# Patient Record
Sex: Female | Born: 1935 | Race: White | Hispanic: No | Smoking: Former smoker
Health system: Southern US, Community
[De-identification: ages and names within clinical notes are randomized; demographics above are authoritative.]

## PROBLEM LIST (undated history)

## (undated) DIAGNOSIS — I5032 Chronic diastolic (congestive) heart failure: Secondary | ICD-10-CM

## (undated) DIAGNOSIS — M109 Gout, unspecified: Secondary | ICD-10-CM

## (undated) DIAGNOSIS — N183 Chronic kidney disease, stage 3 unspecified: Secondary | ICD-10-CM

## (undated) DIAGNOSIS — I38 Endocarditis, valve unspecified: Secondary | ICD-10-CM

## (undated) DIAGNOSIS — E139 Other specified diabetes mellitus without complications: Secondary | ICD-10-CM

## (undated) DIAGNOSIS — I350 Nonrheumatic aortic (valve) stenosis: Secondary | ICD-10-CM

## (undated) DIAGNOSIS — J189 Pneumonia, unspecified organism: Secondary | ICD-10-CM

## (undated) DIAGNOSIS — I10 Essential (primary) hypertension: Secondary | ICD-10-CM

## (undated) DIAGNOSIS — J449 Chronic obstructive pulmonary disease, unspecified: Secondary | ICD-10-CM

## (undated) DIAGNOSIS — I482 Chronic atrial fibrillation, unspecified: Secondary | ICD-10-CM

## (undated) DIAGNOSIS — I4891 Unspecified atrial fibrillation: Secondary | ICD-10-CM

## (undated) DIAGNOSIS — I2510 Atherosclerotic heart disease of native coronary artery without angina pectoris: Secondary | ICD-10-CM

## (undated) DIAGNOSIS — I219 Acute myocardial infarction, unspecified: Secondary | ICD-10-CM

## (undated) DIAGNOSIS — E785 Hyperlipidemia, unspecified: Secondary | ICD-10-CM

## (undated) HISTORY — DX: Nonrheumatic aortic (valve) stenosis: I35.0

## (undated) HISTORY — DX: Chronic obstructive pulmonary disease, unspecified: J44.9

## (undated) HISTORY — DX: Unspecified atrial fibrillation: I48.91

## (undated) HISTORY — DX: Chronic kidney disease, stage 3 unspecified: N18.30

## (undated) HISTORY — PX: OTHER SURGICAL HISTORY: SHX169

## (undated) HISTORY — DX: Gout, unspecified: M10.9

## (undated) HISTORY — DX: Other specified diabetes mellitus without complications: E13.9

## (undated) HISTORY — DX: Hyperlipidemia, unspecified: E78.5

## (undated) HISTORY — DX: Endocarditis, valve unspecified: I38

## (undated) HISTORY — DX: Acute myocardial infarction, unspecified: I21.9

## (undated) HISTORY — DX: Chronic atrial fibrillation, unspecified: I48.20

## (undated) HISTORY — DX: Atherosclerotic heart disease of native coronary artery without angina pectoris: I25.10

## (undated) HISTORY — DX: Essential (primary) hypertension: I10

## (undated) HISTORY — DX: Chronic diastolic (congestive) heart failure: I50.32

## (undated) HISTORY — DX: Pneumonia, unspecified organism: J18.9

---

## 2003-01-10 ENCOUNTER — Emergency Department (HOSPITAL_COMMUNITY): Admission: EM | Admit: 2003-01-10 | Discharge: 2003-01-10 | Payer: Self-pay | Admitting: Emergency Medicine

## 2005-06-03 ENCOUNTER — Ambulatory Visit: Payer: Self-pay | Admitting: Family Medicine

## 2005-09-24 ENCOUNTER — Ambulatory Visit: Payer: Self-pay | Admitting: Family Medicine

## 2006-01-27 ENCOUNTER — Ambulatory Visit: Payer: Self-pay | Admitting: Family Medicine

## 2010-11-07 ENCOUNTER — Other Ambulatory Visit (HOSPITAL_COMMUNITY): Payer: Self-pay | Admitting: Nephrology

## 2010-11-07 DIAGNOSIS — N289 Disorder of kidney and ureter, unspecified: Secondary | ICD-10-CM

## 2010-11-15 ENCOUNTER — Ambulatory Visit (HOSPITAL_COMMUNITY)
Admission: RE | Admit: 2010-11-15 | Discharge: 2010-11-15 | Disposition: A | Discharge status: Home / Self Care | Payer: PRIVATE HEALTH INSURANCE | Source: Ambulatory Visit | Attending: Nephrology | Admitting: Nephrology

## 2010-11-15 DIAGNOSIS — N289 Disorder of kidney and ureter, unspecified: Secondary | ICD-10-CM | POA: Insufficient documentation

## 2013-01-04 ENCOUNTER — Telehealth: Payer: Self-pay | Admitting: Physician Assistant

## 2013-01-04 NOTE — Telephone Encounter (Signed)
APPT MADE

## 2013-01-05 ENCOUNTER — Encounter: Payer: Self-pay | Admitting: Physician Assistant

## 2013-01-05 ENCOUNTER — Ambulatory Visit (INDEPENDENT_AMBULATORY_CARE_PROVIDER_SITE_OTHER): Payer: Medicaid Other | Admitting: Physician Assistant

## 2013-01-05 VITALS — BP 105/75 | HR 126 | Temp 97.3°F

## 2013-01-05 DIAGNOSIS — L0291 Cutaneous abscess, unspecified: Secondary | ICD-10-CM

## 2013-01-05 DIAGNOSIS — L0390 Cellulitis, unspecified: Secondary | ICD-10-CM

## 2013-01-05 DIAGNOSIS — R509 Fever, unspecified: Secondary | ICD-10-CM

## 2013-01-05 LAB — POCT URINALYSIS DIPSTICK
Protein, UA: NEGATIVE
Spec Grav, UA: 1.01
Urobilinogen, UA: NEGATIVE
pH, UA: 6

## 2013-01-05 LAB — POCT CBC
Granulocyte percent: 78 %G (ref 37–80)
HCT, POC: 41.1 % (ref 37.7–47.9)
MCV: 91 fL (ref 80–97)
Platelet Count, POC: 245 10*3/uL (ref 142–424)
RBC: 4.5 M/uL (ref 4.04–5.48)

## 2013-01-05 MED ORDER — CIPROFLOXACIN HCL 500 MG PO TABS: 500.0000 mg | ORAL_TABLET | Freq: Two times a day (BID) | ORAL | Status: DC

## 2013-01-05 NOTE — Progress Notes (Signed)
  Subjective:    Patient ID: Laura Jacobs, female    DOB: November 16, 1933, 79 y.o.   MRN: [id number]  HPI Comments: Left foot warm swollen and tender for the past 3 days; this morning had an episode of heat rising up the leg, felt light headed but did not lose consciousness; called EMS and was evaluated at home  But determined not needed to be transported to hospital     Review of Systems  Constitutional: Positive for fever and chills.  Musculoskeletal: Positive for gait problem.  Skin: Positive for color change and wound.  Neurological: Positive for weakness.  Psychiatric/Behavioral: Positive for sleep disturbance.  All other systems reviewed and are negative.       Objective:   Physical Exam  Vitals reviewed. Constitutional: She is oriented to person, place, and time. She appears well-developed and well-nourished.  HENT:  Head: Normocephalic and atraumatic.  Right Ear: External ear normal.  Left Ear: External ear normal.  Eyes: EOM are normal. Pupils are equal, round, and reactive to light.  Conjunctivae pale  Neck: Normal range of motion. Neck supple.  Cardiovascular: Normal rate, regular rhythm and normal heart sounds.   Pulmonary/Chest: Effort normal and breath sounds normal.  Musculoskeletal: She exhibits tenderness.  Dorsum left foot along metatarsal region erythematous, left ankle full ROM, Homan's neg; blue tortuous veins  Neurological: She is alert and oriented to person, place, and time.  Psychiatric: She has a normal mood and affect. Her behavior is normal. Judgment and thought content normal.          Assessment & Plan:  Cellulitis left foot Orders Placed This Encounter  Procedures  . CBC with Differential  . POCT urinalysis dipstick  . POCT CBC   Meds ordered this encounter  Medications  . amLODipine (NORVASC) 5 MG tablet    Sig: Take 5 mg by mouth daily.  Marland Kitchen atorvastatin (LIPITOR) 40 MG tablet    Sig: Take 40 mg by mouth daily.  . metoprolol  (LOPRESSOR) 50 MG tablet    Sig: Take 50 mg by mouth 2 (two) times daily.  Marland Kitchen triamterene-hydrochlorothiazide (MAXZIDE-25) 37.5-25 MG per tablet    Sig:   . ezetimibe (ZETIA) 10 MG tablet    Sig: Take 10 mg by mouth daily.  . ciprofloxacin (CIPRO) 500 MG tablet    Sig: Take 1 tablet (500 mg total) by mouth 2 (two) times daily.    Dispense:  20 tablet    Refill:  0    Order Specific Question:  Supervising Provider    Answer:  Chipper Herb [phone]

## 2013-01-11 ENCOUNTER — Telehealth: Payer: Self-pay | Admitting: *Deleted

## 2013-01-11 NOTE — Telephone Encounter (Signed)
Pt aware , finish antibiotics

## 2013-01-11 NOTE — Telephone Encounter (Signed)
Message copied by Shelbie Ammons on Mon Jan 11, 2013  4:08 PM ------      Message from: Gearlean Alf      Created: Tue Jan 05, 2013  3:13 PM       Wbc 13.2 - take antibiotic as directed ------

## 2013-01-12 ENCOUNTER — Other Ambulatory Visit: Payer: Self-pay | Admitting: Physician Assistant

## 2013-01-14 ENCOUNTER — Other Ambulatory Visit: Payer: Self-pay | Admitting: Physician Assistant

## 2013-01-15 NOTE — Telephone Encounter (Signed)
Patient last seen on 01-05-13 by ACM. Dx with cellulitis and given Cipro #20. Please advise. Thank you

## 2013-01-15 NOTE — Telephone Encounter (Signed)
Needs to be seen. To see what we are treating!!! Thanks

## 2013-01-18 ENCOUNTER — Ambulatory Visit: Payer: Medicare Other | Admitting: Family Medicine

## 2013-01-18 NOTE — Telephone Encounter (Signed)
Spoke with pt this am about her request for antibiotic that was denied and informed her that she needs an office  visit. Pt denied offfice visit and stated she appointment with MMM already scheduled.  Pt aware to be seen sooner if need be

## 2013-02-04 ENCOUNTER — Ambulatory Visit (INDEPENDENT_AMBULATORY_CARE_PROVIDER_SITE_OTHER): Payer: Medicare Other | Admitting: Nurse Practitioner

## 2013-02-04 ENCOUNTER — Encounter: Payer: Self-pay | Admitting: Nurse Practitioner

## 2013-02-04 VITALS — BP 132/83 | HR 65 | Temp 97.2°F | Wt 143.0 lb

## 2013-02-04 DIAGNOSIS — N184 Chronic kidney disease, stage 4 (severe): Secondary | ICD-10-CM | POA: Insufficient documentation

## 2013-02-04 DIAGNOSIS — I10 Essential (primary) hypertension: Secondary | ICD-10-CM

## 2013-02-04 DIAGNOSIS — R7989 Other specified abnormal findings of blood chemistry: Secondary | ICD-10-CM

## 2013-02-04 DIAGNOSIS — I119 Hypertensive heart disease without heart failure: Secondary | ICD-10-CM | POA: Insufficient documentation

## 2013-02-04 DIAGNOSIS — E785 Hyperlipidemia, unspecified: Secondary | ICD-10-CM | POA: Insufficient documentation

## 2013-02-04 LAB — COMPLETE METABOLIC PANEL WITH GFR
ALT: 16 U/L (ref 0–35)
AST: 22 U/L (ref 0–37)
Albumin: 4.1 g/dL (ref 3.5–5.2)
Alkaline Phosphatase: 95 U/L (ref 39–117)
GFR, Est Non African American: 36 mL/min — ABNORMAL LOW
Glucose, Bld: 86 mg/dL (ref 70–99)
Potassium: 3.7 mEq/L (ref 3.5–5.3)
Sodium: 140 mEq/L (ref 135–145)
Total Bilirubin: 1.1 mg/dL (ref 0.3–1.2)
Total Protein: 7 g/dL (ref 6.0–8.3)

## 2013-02-04 NOTE — Progress Notes (Signed)
  Subjective:    Patient ID: Laura Jacobs, female    DOB: Sep 08, 1934, 79 y.o.   MRN: [id number]  Hypertension This is a chronic problem. The current episode started more than 1 year ago. The problem is unchanged. The problem is uncontrolled. Pertinent negatives include no blurred vision, chest pain, headaches, malaise/fatigue, neck pain, orthopnea, peripheral edema or shortness of breath. There are no associated agents to hypertension. Risk factors for coronary artery disease include dyslipidemia and post-menopausal state. Past treatments include diuretics, calcium channel blockers and beta blockers. The current treatment provides moderate improvement. Compliance problems include exercise and diet.  Hypertensive end-organ damage includes CAD/MI. Identifiable causes of hypertension include chronic renal disease.  Hyperlipidemia This is a chronic problem. The current episode started more than 1 year ago. The problem is controlled. Recent lipid tests were reviewed and are normal. Exacerbating diseases include chronic renal disease. There are no known factors aggravating her hyperlipidemia. Pertinent negatives include no chest pain, focal sensory loss, focal weakness, leg pain, myalgias or shortness of breath. Current antihyperlipidemic treatment includes statins. The current treatment provides significant improvement of lipids. Compliance problems include adherence to diet and adherence to exercise.  Risk factors for coronary artery disease include a sedentary lifestyle.  CKD Saw nephrologist 1 year ago. No changes to meds. Patient denies any peripheral edema. GOUT Had a flare-up couple weeks ago- Much better- This was her first episode.   Review of Systems  Constitutional: Negative for malaise/fatigue.  HENT: Negative for neck pain.   Eyes: Negative for blurred vision.  Respiratory: Negative for shortness of breath.   Cardiovascular: Negative for chest pain and orthopnea.  Musculoskeletal:  Negative for myalgias.  Neurological: Negative for focal weakness and headaches.       Objective:   Physical Exam  Constitutional: She is oriented to person, place, and time. She appears well-developed and well-nourished.  HENT:  Head: Normocephalic.  Right Ear: Hearing, tympanic membrane, external ear and ear canal normal.  Left Ear: Hearing, tympanic membrane, external ear and ear canal normal.  Nose: Nose normal.  Mouth/Throat: Uvula is midline, oropharynx is clear and moist and mucous membranes are normal.  Eyes: Conjunctivae and EOM are normal. Pupils are equal, round, and reactive to light.  Neck: Normal range of motion. Neck supple. No JVD present. No thyromegaly present.  Cardiovascular: Normal rate, normal heart sounds and intact distal pulses.   No murmur heard. Pulmonary/Chest: Effort normal and breath sounds normal. She has no wheezes. She has no rales.  Abdominal: Soft. Bowel sounds are normal. She exhibits no mass. There is no tenderness.  Musculoskeletal: Normal range of motion.  Neurological: She is alert and oriented to person, place, and time. She has normal reflexes.  Skin: Skin is warm and dry.  Psychiatric: She has a normal mood and affect. Her behavior is normal. Judgment and thought content normal.   BP 155/73  Pulse 65  Temp(Src) 97.2 F (36.2 C) (Oral)  Wt 143 lb (64.864 kg)        Assessment & Plan:  1. Hyperlipidemia Low fat diet and exercise - NMR Lipoprofile with Lipids  2. Hypertension *Low Na+ diet** - COMPLETE METABOLIC PANEL WITH GFR  3. Chronic kidney disease (CKD), stage IV (severe) Keep f/u with neurologist  [name], FNP

## 2013-02-04 NOTE — Patient Instructions (Signed)

## 2013-02-05 ENCOUNTER — Encounter: Payer: Self-pay | Admitting: *Deleted

## 2013-02-05 LAB — NMR LIPOPROFILE WITH LIPIDS
Cholesterol, Total: 124 mg/dL (ref <200)
HDL Size: 8.9 nm — ABNORMAL LOW (ref >=9.2)
HDL-C: 35 mg/dL — ABNORMAL LOW (ref >=40)
Large HDL-P: 3.1 umol/L — ABNORMAL LOW (ref >=4.8)
Large VLDL-P: 3.8 nmol/L — ABNORMAL HIGH (ref <=2.7)
Triglycerides: 139 mg/dL (ref <150)
VLDL Size: 46.9 nm — ABNORMAL HIGH (ref <=46.6)

## 2013-02-05 NOTE — Addendum Note (Signed)
Addended by: Pollyann Kennedy F on: 02/05/2013 03:19 PM   Modules accepted: Orders

## 2013-02-05 NOTE — Progress Notes (Signed)
Quick Note:  Letter of lab results sent to patient ______

## 2013-02-08 ENCOUNTER — Other Ambulatory Visit: Payer: Self-pay | Admitting: Physician Assistant

## 2013-02-10 NOTE — Telephone Encounter (Signed)
LAST LABS 11/13

## 2013-05-10 ENCOUNTER — Ambulatory Visit (INDEPENDENT_AMBULATORY_CARE_PROVIDER_SITE_OTHER): Payer: Medicare Other | Admitting: Nurse Practitioner

## 2013-05-10 ENCOUNTER — Encounter: Payer: Self-pay | Admitting: Nurse Practitioner

## 2013-05-10 VITALS — BP 129/68 | HR 66 | Temp 97.2°F | Ht 65.0 in | Wt 141.0 lb

## 2013-05-10 DIAGNOSIS — I10 Essential (primary) hypertension: Secondary | ICD-10-CM

## 2013-05-10 DIAGNOSIS — M109 Gout, unspecified: Secondary | ICD-10-CM

## 2013-05-10 DIAGNOSIS — E785 Hyperlipidemia, unspecified: Secondary | ICD-10-CM

## 2013-05-10 DIAGNOSIS — N184 Chronic kidney disease, stage 4 (severe): Secondary | ICD-10-CM

## 2013-05-10 MED ORDER — METOPROLOL TARTRATE 50 MG PO TABS: 50.0000 mg | ORAL_TABLET | Freq: Two times a day (BID) | ORAL | Status: DC

## 2013-05-10 MED ORDER — TRIAMTERENE-HCTZ 37.5-25 MG PO TABS: 1.0000 | ORAL_TABLET | Freq: Every day | ORAL | Status: DC

## 2013-05-10 MED ORDER — ATORVASTATIN CALCIUM 40 MG PO TABS: 40.0000 mg | ORAL_TABLET | Freq: Every day | ORAL | Status: DC

## 2013-05-10 MED ORDER — EZETIMIBE 10 MG PO TABS: 10.0000 mg | ORAL_TABLET | Freq: Every day | ORAL | Status: DC

## 2013-05-10 MED ORDER — AMLODIPINE BESYLATE 5 MG PO TABS: 5.0000 mg | ORAL_TABLET | Freq: Every day | ORAL | Status: DC

## 2013-05-10 NOTE — Progress Notes (Signed)
Subjective:    Patient ID: Laura Jacobs, female    DOB: 16-Jul-1934, 79 y.o.   MRN: [id number]  Hypertension This is a chronic problem. The current episode started more than 1 year ago. The problem is unchanged. The problem is controlled. Associated symptoms include sweats. Pertinent negatives include no blurred vision, chest pain, headaches, neck pain, orthopnea, peripheral edema or shortness of breath. There are no associated agents to hypertension. Risk factors for coronary artery disease include dyslipidemia, post-menopausal state and sedentary lifestyle. Past treatments include calcium channel blockers, beta blockers and diuretics. The current treatment provides significant improvement. Compliance problems include diet and exercise.  Hypertensive end-organ damage includes kidney disease and CAD/MI (MI in 1998). Identifiable causes of hypertension include chronic renal disease.  Hyperlipidemia This is a chronic problem. The current episode started more than 1 year ago. The problem is uncontrolled. Recent lipid tests were reviewed and are high. Exacerbating diseases include chronic renal disease. Pertinent negatives include no chest pain or shortness of breath. Current antihyperlipidemic treatment includes statins and ezetimibe. The current treatment provides moderate improvement of lipids. Compliance problems include adherence to diet and adherence to exercise.  Risk factors for coronary artery disease include hypertension, post-menopausal and a sedentary lifestyle.  GOUT Last flareup was several months ago- doing well right now- on no maintenance meds CAD Bypass 1998- no need to use nitrogylcerin at ome.  Review of Systems  HENT: Negative for neck pain.   Eyes: Negative for blurred vision.  Respiratory: Negative for shortness of breath.   Cardiovascular: Negative for chest pain and orthopnea.  Neurological: Negative for headaches.  All other systems reviewed and are negative.      Objective:   Physical Exam  Constitutional: She is oriented to person, place, and time. She appears well-developed and well-nourished.  HENT:  Nose: Nose normal.  Mouth/Throat: Oropharynx is clear and moist.  Eyes: EOM are normal.  Neck: Trachea normal, normal range of motion and full passive range of motion without pain. Neck supple. No JVD present. Carotid bruit is not present. No thyromegaly present.  Cardiovascular: Normal rate, regular rhythm, normal heart sounds and intact distal pulses.  Exam reveals no gallop and no friction rub.   No murmur heard. Pulmonary/Chest: Effort normal and breath sounds normal.  Abdominal: Soft. Bowel sounds are normal. She exhibits no distension and no mass. There is no tenderness.  Musculoskeletal: Normal range of motion.  Lymphadenopathy:    She has no cervical adenopathy.  Neurological: She is alert and oriented to person, place, and time. She has normal reflexes.  Skin: Skin is warm and dry.  Psychiatric: She has a normal mood and affect. Her behavior is normal. Judgment and thought content normal.   BP 129/68  Pulse 66  Temp(Src) 97.2 F (36.2 C) (Oral)  Ht 5\' 5"  (1.651 m)  Wt 141 lb (63.957 kg)  BMI 23.46 kg/m2        Assessment & Plan:  1. Gout Low purine diet discussed  2. Hyperlipidemia Low fat diet Encouraged exercise - NMR, lipoprofile - ezetimibe (ZETIA) 10 MG tablet; Take 1 tablet (10 mg total) by mouth daily.  Dispense: 30 tablet; Refill: 5 - atorvastatin (LIPITOR) 40 MG tablet; Take 1 tablet (40 mg total) by mouth daily.  Dispense: 30 tablet; Refill: 5  3. Hypertension Low NA+  diet - CMP14+EGFR - triamterene-hydrochlorothiazide (MAXZIDE-25) 37.5-25 MG per tablet; Take 1 tablet by mouth daily.  Dispense: 30 tablet; Refill: 5 - metoprolol (LOPRESSOR) 50 MG tablet; Take  1 tablet (50 mg total) by mouth 2 (two) times daily.  Dispense: 60 tablet; Refill: 5 - amLODipine (NORVASC) 5 MG tablet; Take 1 tablet (5 mg total) by  mouth daily.  Dispense: 30 tablet; Refill: 5  4. Chronic kidney disease (CKD), stage IV (severe) Keep follow-up with nephrologist  [name], FNP

## 2013-05-10 NOTE — Patient Instructions (Signed)

## 2013-05-11 LAB — CMP14+EGFR
Albumin: 4.5 g/dL (ref 3.5–4.8)
BUN: 18 mg/dL (ref 8–27)
CO2: 26 mmol/L (ref 18–29)
Calcium: 8.9 mg/dL (ref 8.6–10.2)
Chloride: 103 mmol/L (ref 97–108)
GFR calc Af Amer: 48 mL/min/{1.73_m2} — ABNORMAL LOW (ref >59)
Glucose: 91 mg/dL (ref 65–99)
Total Bilirubin: 0.7 mg/dL (ref 0.0–1.2)
Total Protein: 6.6 g/dL (ref 6.0–8.5)

## 2013-05-11 LAB — NMR, LIPOPROFILE
Cholesterol: 99 mg/dL (ref <200)
HDL Cholesterol by NMR: 32 mg/dL — ABNORMAL LOW (ref >=40)
LDL Particle Number: 754 nmol/L (ref <1000)
LDLC SERPL CALC-MCNC: 38 mg/dL (ref <100)
Triglycerides by NMR: 147 mg/dL (ref <150)

## 2013-07-01 ENCOUNTER — Other Ambulatory Visit: Payer: Self-pay

## 2013-07-01 MED ORDER — NITROGLYCERIN 0.4 MG SL SUBL: 0.4000 mg | SUBLINGUAL_TABLET | SUBLINGUAL | Status: DC | PRN

## 2013-08-11 ENCOUNTER — Ambulatory Visit (INDEPENDENT_AMBULATORY_CARE_PROVIDER_SITE_OTHER): Payer: Medicare Other | Admitting: Nurse Practitioner

## 2013-08-11 ENCOUNTER — Encounter: Payer: Self-pay | Admitting: Nurse Practitioner

## 2013-08-11 VITALS — BP 136/76 | HR 62 | Temp 97.1°F | Ht 65.0 in | Wt 146.0 lb

## 2013-08-11 DIAGNOSIS — I10 Essential (primary) hypertension: Secondary | ICD-10-CM

## 2013-08-11 DIAGNOSIS — I2510 Atherosclerotic heart disease of native coronary artery without angina pectoris: Secondary | ICD-10-CM | POA: Insufficient documentation

## 2013-08-11 DIAGNOSIS — E785 Hyperlipidemia, unspecified: Secondary | ICD-10-CM

## 2013-08-11 DIAGNOSIS — N184 Chronic kidney disease, stage 4 (severe): Secondary | ICD-10-CM

## 2013-08-11 DIAGNOSIS — M109 Gout, unspecified: Secondary | ICD-10-CM

## 2013-08-11 NOTE — Patient Instructions (Signed)
Coronary Artery Disease, Risk Factors Research has shown that the risk of developing coronary artery disease (CAD) and having a heart attack increases with each factor you have. RISK FACTORS YOU CANNOT CHANGE  Your age. Your risk goes up as you get older. Most heart attacks happen to people over the age of 24.  Gender. Men have a greater risk of heart attack than women, and they have attacks earlier in life. However, women are more likely to die from a heart attack.  Heredity. Children of parents with heart disease are more likely to develop it themselves.  Race. African Americans and other ethnic groups have a higher risk, possibly because of high blood pressure, a tendency toward obesity, and diabetes.  Your family. Most people with a strong family history of heart disease have one or more other risk factors. RISK FACTORS YOU CAN CHANGE  Exposure to tobacco smoke. Even secondhand smoke greatly increases the risk for heart disease.  High blood cholesterol may be lowered with changes in diet, activity, and medicines.  High blood pressure makes the heart work harder. This causes the heart muscles to become thick and, eventually, weaker. It also increases your risk of stroke, heart attack, and kidney or heart failure.  Physical inactivity is a risk factor for CAD. Regular physical activity helps prevent heart and blood vessel disease. Exercise helps control blood cholesterol, diabetes, obesity, and it may help lower blood pressure in some people.  Excess body fat, especially belly fat, increases the risk of heart disease and stroke even if there are no other risk factors. Excess weight increases the heart's workload and raises blood pressure and blood cholesterol.  Diabetes seriously increases your risk of developing CAD. If you have diabetes, you should work with your caregiver to manage it and control other risk factors. OTHER RISK FACTORS FOR CAD  How you respond to stress.  Drinking  too much alcohol may raise blood pressure, cause heart failure, and lead to stroke.  Total cholesterol greater than 200 milligrams.  HDL (good) cholesterol less than 40 milligrams. HDL helps keep cholesterol from building up in the walls of the arteries. PREVENTING CAD  Maintain a healthy weight.  Exercise or do physical activity.  Eat a heart-healthy diet low in fat and salt and high in fiber.  Control your blood pressure to keep it below 120 over 80.  Keep your cholesterol at a level that lowers your risk.  Manage diabetes if you have it.  Stop smoking.  Learn how to manage stress. HEART SMART SUBSTITUTIONS  Instead of whole or 2% milk and cream, use skim milk.  Instead of fried foods, eat baked, steamed, boiled, broiled, or microwaved foods.  Instead of lard, butter, palm and coconut oils, cook with unsaturated vegetable oils, such as corn, olive, canola, safflower, sesame, soybean, sunflower, or peanut.  Instead of fatty cuts of meat, eat lean cuts of meat or cut off the fatty parts.  Instead of 1 whole egg in recipes, use 2 egg whites.  Instead of sauces, butter, and salt, season vegetables with herbs and spices.  Instead of regular hard and processed cheeses, eat low-fat, low-sodium cheeses.  Instead of salted potato chips, choose low-fat, unsalted tortilla and potato chips and unsalted pretzels and popcorn.  Instead of sour cream and mayonnaise, use plain low-fat yogurt, low-fat cottage cheese, or low-fat or "light" sour cream. FOR MORE INFORMATION  National Heart Lung and Blood Institute: http://www.ball-ray.net/ American Heart Association: weddingcandid.com Document Released: 12/14/2003 Document Revised: 12/16/2011  Document Reviewed: 12/09/2007 [facility] Patient Information 2014 [place].

## 2013-08-11 NOTE — Progress Notes (Signed)
Subjective:    Patient ID: Laura Jacobs, female    DOB: June 06, 1934, 79 y.o.   MRN: [id number]  Hypertension This is a chronic problem. The current episode started more than 1 year ago. The problem is unchanged. The problem is controlled. Pertinent negatives include no blurred vision, chest pain, headaches, neck pain, orthopnea, peripheral edema, shortness of breath or sweats. There are no associated agents to hypertension. Risk factors for coronary artery disease include dyslipidemia, post-menopausal state, sedentary lifestyle and smoking/tobacco exposure. Past treatments include calcium channel blockers, beta blockers and diuretics. The current treatment provides significant improvement. Compliance problems include diet and exercise.  Hypertensive end-organ damage includes kidney disease and CAD/MI (MI in 1998). Identifiable causes of hypertension include chronic renal disease.  Hyperlipidemia This is a chronic problem. The current episode started more than 1 year ago. The problem is uncontrolled. Recent lipid tests were reviewed and are high. Exacerbating diseases include chronic renal disease. She has no history of diabetes. Factors aggravating her hyperlipidemia include smoking and fatty foods. Pertinent negatives include no chest pain or shortness of breath. Current antihyperlipidemic treatment includes statins and ezetimibe. The current treatment provides moderate improvement of lipids. Compliance problems include adherence to diet and adherence to exercise.  Risk factors for coronary artery disease include hypertension, post-menopausal, a sedentary lifestyle and dyslipidemia.  GOUT Last flareup was several months ago- doing well right now- on no maintenance meds CAD Bypass 1998- no need to use nitrogylcerin at ome.  Review of Systems  Eyes: Negative for blurred vision.  Respiratory: Negative for shortness of breath.   Cardiovascular: Negative for chest pain and orthopnea.   Musculoskeletal: Negative for neck pain.  Neurological: Negative for headaches.  All other systems reviewed and are negative.       Objective:   Physical Exam  Constitutional: She is oriented to person, place, and time. She appears well-developed and well-nourished.  HENT:  Nose: Nose normal.  Mouth/Throat: Oropharynx is clear and moist.  Eyes: EOM are normal.  Neck: Trachea normal, normal range of motion and full passive range of motion without pain. Neck supple. No JVD present. Carotid bruit is not present. No thyromegaly present.  Cardiovascular: Normal rate, regular rhythm, normal heart sounds and intact distal pulses.  Exam reveals no gallop and no friction rub.   No murmur heard. Pulmonary/Chest: Effort normal and breath sounds normal.  Crackles in bases bilaterally   Abdominal: Soft. Bowel sounds are normal. She exhibits no distension and no mass. There is no tenderness.  Musculoskeletal: Normal range of motion.  Lymphadenopathy:    She has no cervical adenopathy.  Neurological: She is alert and oriented to person, place, and time. She has normal reflexes.  Skin: Skin is warm and dry.  Psychiatric: She has a normal mood and affect. Her behavior is normal. Judgment and thought content normal.   BP 136/76  Pulse 62  Temp(Src) 97.1 F (36.2 C) (Oral)  Ht 5\' 5"  (1.651 m)  Wt 146 lb (66.225 kg)  BMI 24.30 kg/m2        Assessment & Plan:   1. Hyperlipidemia   2. Hypertension   3. Gout   4. Chronic kidney disease (CKD), stage IV (severe)   5. CAD (coronary artery disease)    Orders Placed This Encounter  Procedures  . CMP14+EGFR  . NMR, lipoprofile   Stop Smoking Continue all meds Labs pending Diet and exercise encouraged Health maintenance reviewed Follow up in 3 months   [place], FNP

## 2013-08-13 LAB — CMP14+EGFR
Albumin/Globulin Ratio: 1.7 (ref 1.1–2.5)
Albumin: 4.6 g/dL (ref 3.5–4.8)
Alkaline Phosphatase: 95 IU/L (ref 39–117)
BUN/Creatinine Ratio: 17 (ref 11–26)
BUN: 25 mg/dL (ref 8–27)
Chloride: 99 mmol/L (ref 97–108)
GFR calc Af Amer: 41 mL/min/{1.73_m2} — ABNORMAL LOW (ref >59)
Potassium: 4.2 mmol/L (ref 3.5–5.2)
Total Bilirubin: 0.7 mg/dL (ref 0.0–1.2)

## 2013-08-13 LAB — NMR, LIPOPROFILE
Cholesterol: 109 mg/dL (ref <200)
LDL Particle Number: 907 nmol/L (ref <1000)
LDL Size: 20.5 nm — ABNORMAL LOW (ref >20.5)
LP-IR Score: 69 — ABNORMAL HIGH (ref <=45)
Small LDL Particle Number: 620 nmol/L — ABNORMAL HIGH (ref <=527)

## 2013-08-17 ENCOUNTER — Encounter: Payer: Self-pay | Admitting: *Deleted

## 2013-08-17 NOTE — Progress Notes (Signed)
Quick Note:  Copy of labs sent to patient ______ 

## 2013-12-03 ENCOUNTER — Ambulatory Visit (INDEPENDENT_AMBULATORY_CARE_PROVIDER_SITE_OTHER): Payer: Medicare Other | Admitting: Nurse Practitioner

## 2013-12-03 ENCOUNTER — Encounter: Payer: Self-pay | Admitting: Nurse Practitioner

## 2013-12-03 VITALS — BP 133/56 | HR 68 | Temp 97.0°F | Ht 64.0 in | Wt 150.0 lb

## 2013-12-03 DIAGNOSIS — I10 Essential (primary) hypertension: Secondary | ICD-10-CM

## 2013-12-03 DIAGNOSIS — I2510 Atherosclerotic heart disease of native coronary artery without angina pectoris: Secondary | ICD-10-CM

## 2013-12-03 DIAGNOSIS — E785 Hyperlipidemia, unspecified: Secondary | ICD-10-CM

## 2013-12-03 DIAGNOSIS — M109 Gout, unspecified: Secondary | ICD-10-CM

## 2013-12-03 DIAGNOSIS — N184 Chronic kidney disease, stage 4 (severe): Secondary | ICD-10-CM

## 2013-12-03 MED ORDER — EZETIMIBE 10 MG PO TABS: 10.0000 mg | ORAL_TABLET | Freq: Every day | ORAL | Status: DC

## 2013-12-03 MED ORDER — AMLODIPINE BESYLATE 5 MG PO TABS: 5.0000 mg | ORAL_TABLET | Freq: Every day | ORAL | Status: DC

## 2013-12-03 MED ORDER — TRIAMTERENE-HCTZ 37.5-25 MG PO TABS: 1.0000 | ORAL_TABLET | Freq: Every day | ORAL | Status: DC

## 2013-12-03 MED ORDER — ATORVASTATIN CALCIUM 40 MG PO TABS: 40.0000 mg | ORAL_TABLET | Freq: Every day | ORAL | Status: DC

## 2013-12-03 MED ORDER — METOPROLOL TARTRATE 50 MG PO TABS: 50.0000 mg | ORAL_TABLET | Freq: Two times a day (BID) | ORAL | Status: DC

## 2013-12-03 NOTE — Progress Notes (Signed)
Subjective:    Patient ID: Laura Jacobs, female    DOB: [birth year], 80 y.o.   MRN: [id number]  Patient in today for follow up- no complaints- no changes since last visit.  Hypertension This is a chronic problem. The current episode started more than 1 year ago. The problem is unchanged. The problem is uncontrolled. Pertinent negatives include no blurred vision, chest pain, headaches, malaise/fatigue, neck pain, orthopnea, peripheral edema or shortness of breath. There are no associated agents to hypertension. Risk factors for coronary artery disease include dyslipidemia and post-menopausal state. Past treatments include diuretics, calcium channel blockers and beta blockers. The current treatment provides moderate improvement. Compliance problems include exercise and diet.  Hypertensive end-organ damage includes CAD/MI. Identifiable causes of hypertension include chronic renal disease.  Hyperlipidemia This is a chronic problem. The current episode started more than 1 year ago. The problem is controlled. Recent lipid tests were reviewed and are normal. Exacerbating diseases include chronic renal disease. There are no known factors aggravating her hyperlipidemia. Pertinent negatives include no chest pain, focal sensory loss, focal weakness, leg pain, myalgias or shortness of breath. Current antihyperlipidemic treatment includes statins. The current treatment provides significant improvement of lipids. Compliance problems include adherence to diet and adherence to exercise.  Risk factors for coronary artery disease include a sedentary lifestyle.  CKD Saw nephrologist 1 year ago. No changes to meds. Patient denies any peripheral edema. GOUT Had a flare-up couple weeks ago- Much better- This was her first episode.   Review of Systems  Constitutional: Negative for malaise/fatigue.  Eyes: Negative for blurred vision.  Respiratory: Negative for shortness of breath.   Cardiovascular: Negative for  chest pain and orthopnea.  Musculoskeletal: Negative for myalgias and neck pain.  Neurological: Negative for focal weakness and headaches.       Objective:   Physical Exam  Constitutional: She is oriented to person, place, and time. She appears well-developed and well-nourished.  HENT:  Head: Normocephalic.  Right Ear: Hearing, tympanic membrane, external ear and ear canal normal.  Left Ear: Hearing, tympanic membrane, external ear and ear canal normal.  Nose: Nose normal.  Mouth/Throat: Uvula is midline, oropharynx is clear and moist and mucous membranes are normal.  Eyes: Conjunctivae and EOM are normal. Pupils are equal, round, and reactive to light.  Neck: Normal range of motion. Neck supple. No JVD present. No thyromegaly present.  Cardiovascular: Normal rate, normal heart sounds and intact distal pulses.   No murmur heard. Pulmonary/Chest: Effort normal and breath sounds normal. She has no wheezes. She has no rales.  Abdominal: Soft. Bowel sounds are normal. She exhibits no mass. There is no tenderness.  Musculoskeletal: Normal range of motion.  Neurological: She is alert and oriented to person, place, and time. She has normal reflexes.  Skin: Skin is warm and dry.  Psychiatric: She has a normal mood and affect. Her behavior is normal. Judgment and thought content normal.   BP 133/56  Pulse 68  Temp(Src) 97 F (36.1 C) (Oral)  Ht 5' 4"  (1.626 m)  Wt 150 lb (68.04 kg)  BMI 25.73 kg/m2        Assessment & Plan:   1. Other and unspecified hyperlipidemia   2. Essential hypertension, benign   3. Hypertension   4. Hyperlipidemia   5. Gout   6. Chronic kidney disease (CKD), stage IV (severe)   7. CAD (coronary artery disease)    Orders Placed This Encounter  Procedures  . CMP14+EGFR  . NMR, lipoprofile  Meds ordered this encounter  Medications  . amLODipine (NORVASC) 5 MG tablet    Sig: Take 1 tablet (5 mg total) by mouth daily.    Dispense:  30 tablet     Refill:  5    Order Specific Question:  Supervising Provider    Answer:  Chipper Herb [[id number]]  . atorvastatin (LIPITOR) 40 MG tablet    Sig: Take 1 tablet (40 mg total) by mouth daily.    Dispense:  30 tablet    Refill:  5    Order Specific Question:  Supervising Provider    Answer:  Chipper Herb [[id number]]  . ezetimibe (ZETIA) 10 MG tablet    Sig: Take 1 tablet (10 mg total) by mouth daily.    Dispense:  30 tablet    Refill:  5    Order Specific Question:  Supervising Provider    Answer:  Chipper Herb [[id number]]  . metoprolol (LOPRESSOR) 50 MG tablet    Sig: Take 1 tablet (50 mg total) by mouth 2 (two) times daily.    Dispense:  60 tablet    Refill:  5    Order Specific Question:  Supervising Provider    Answer:  Chipper Herb [[id number]]  . triamterene-hydrochlorothiazide (MAXZIDE-25) 37.5-25 MG per tablet    Sig: Take 1 tablet by mouth daily.    Dispense:  30 tablet    Refill:  5    Order Specific Question:  Supervising Provider    Answer:  Chipper Herb [[id number]]    Labs pending Health maintenance reviewed- refuses most of health maintenance Diet and exercise encouraged Continue all meds Follow up  In 3 months   [place], FNP

## 2013-12-03 NOTE — Patient Instructions (Signed)

## 2013-12-05 LAB — NMR, LIPOPROFILE
CHOLESTEROL: 122 mg/dL (ref <200)
HDL Cholesterol by NMR: 42 mg/dL (ref >=40)
HDL PARTICLE NUMBER: 32.7 umol/L (ref >=30.5)
LDL Particle Number: 959 nmol/L (ref <1000)
LDL SIZE: 20.1 nm — AB (ref >20.5)
LDLC SERPL CALC-MCNC: 58 mg/dL (ref <100)
LP-IR Score: 80 — ABNORMAL HIGH (ref <=45)
SMALL LDL PARTICLE NUMBER: 740 nmol/L — AB (ref <=527)
TRIGLYCERIDES BY NMR: 111 mg/dL (ref <150)

## 2013-12-05 LAB — CMP14+EGFR
A/G RATIO: 1.7 (ref 1.1–2.5)
ALBUMIN: 4.5 g/dL (ref 3.5–4.8)
ALT: 16 IU/L (ref 0–32)
AST: 23 IU/L (ref 0–40)
Alkaline Phosphatase: 86 IU/L (ref 39–117)
BILIRUBIN TOTAL: 0.7 mg/dL (ref 0.0–1.2)
BUN / CREAT RATIO: 16 (ref 11–26)
BUN: 27 mg/dL (ref 8–27)
CO2: 25 mmol/L (ref 18–29)
Calcium: 9.4 mg/dL (ref 8.7–10.3)
Chloride: 100 mmol/L (ref 97–108)
Creatinine, Ser: 1.64 mg/dL — ABNORMAL HIGH (ref 0.57–1.00)
GFR, EST AFRICAN AMERICAN: 35 mL/min/{1.73_m2} — AB (ref >59)
GFR, EST NON AFRICAN AMERICAN: 30 mL/min/{1.73_m2} — AB (ref >59)
GLUCOSE: 97 mg/dL (ref 65–99)
Globulin, Total: 2.6 g/dL (ref 1.5–4.5)
Potassium: 4.1 mmol/L (ref 3.5–5.2)
Sodium: 141 mmol/L (ref 134–144)
TOTAL PROTEIN: 7.1 g/dL (ref 6.0–8.5)

## 2014-02-03 ENCOUNTER — Encounter: Payer: Self-pay | Admitting: *Deleted

## 2014-03-03 ENCOUNTER — Ambulatory Visit (INDEPENDENT_AMBULATORY_CARE_PROVIDER_SITE_OTHER): Payer: Medicare Other | Admitting: Nurse Practitioner

## 2014-03-03 ENCOUNTER — Encounter: Payer: Self-pay | Admitting: Nurse Practitioner

## 2014-03-03 VITALS — BP 144/76 | HR 60 | Temp 97.7°F | Ht 64.0 in | Wt 150.0 lb

## 2014-03-03 DIAGNOSIS — I2510 Atherosclerotic heart disease of native coronary artery without angina pectoris: Secondary | ICD-10-CM

## 2014-03-03 DIAGNOSIS — N184 Chronic kidney disease, stage 4 (severe): Secondary | ICD-10-CM

## 2014-03-03 DIAGNOSIS — M109 Gout, unspecified: Secondary | ICD-10-CM

## 2014-03-03 DIAGNOSIS — I10 Essential (primary) hypertension: Secondary | ICD-10-CM

## 2014-03-03 DIAGNOSIS — E785 Hyperlipidemia, unspecified: Secondary | ICD-10-CM

## 2014-03-03 NOTE — Patient Instructions (Signed)
Medicare Annual Wellness Visit  [place] and the medical providers at [facility] strive to bring you the best medical care.  In doing so we not only want to address your current medical conditions and concerns but also to detect new conditions early and prevent illness, disease and health-related problems.    Medicare offers a yearly Wellness Visit which allows our clinical staff to assess your need for preventative services including immunizations, lifestyle education, counseling to decrease risk of preventable diseases and screening for fall risk and other medical concerns.    This visit is provided free of charge (no copay) for all Medicare recipients. The clinical pharmacists at [facility] have begun to conduct these Wellness Visits which will also include a thorough review of all your medications.    As you primary medical provider recommend that you make an appointment for your Annual Wellness Visit if you have not done so already this year.  You may set up this appointment before you leave today or you may call back [phone]) and schedule an appointment.  Please make sure when you call that you mention that you are scheduling your Annual Wellness Visit with the clinical pharmacist so that the appointment may be made for the proper length of time.    

## 2014-03-03 NOTE — Progress Notes (Signed)
Subjective:    Patient ID: Laura Jacobs, female    DOB: Jun 29, 1934, 80 y.o.   MRN: [id number]  Patient here today for follow up of chronic medical problems. No complaints today   Hypertension This is a chronic problem. The current episode started more than 1 year ago. The problem is unchanged. The problem is controlled. Pertinent negatives include no blurred vision, chest pain, headaches, neck pain, orthopnea, peripheral edema, shortness of breath or sweats. There are no associated agents to hypertension. Risk factors for coronary artery disease include dyslipidemia, post-menopausal state, sedentary lifestyle and smoking/tobacco exposure. Past treatments include calcium channel blockers, beta blockers and diuretics. The current treatment provides significant improvement. Compliance problems include diet and exercise.  Hypertensive end-organ damage includes kidney disease and CAD/MI (MI in 1998). Identifiable causes of hypertension include chronic renal disease.  Hyperlipidemia This is a chronic problem. The current episode started more than 1 year ago. The problem is uncontrolled. Recent lipid tests were reviewed and are high. Exacerbating diseases include chronic renal disease. She has no history of diabetes. Factors aggravating her hyperlipidemia include smoking and fatty foods. Pertinent negatives include no chest pain or shortness of breath. Current antihyperlipidemic treatment includes statins and ezetimibe. The current treatment provides moderate improvement of lipids. Compliance problems include adherence to diet and adherence to exercise.  Risk factors for coronary artery disease include hypertension, post-menopausal, a sedentary lifestyle and dyslipidemia.  GOUT Last flareup was several months ago- doing well right now- on no maintenance meds CAD Bypass 1998- no need to use nitrogylcerin at ome.  Review of Systems  Eyes: Negative for blurred vision.  Respiratory: Negative for  shortness of breath.   Cardiovascular: Negative for chest pain and orthopnea.  Musculoskeletal: Negative for neck pain.  Neurological: Negative for headaches.  All other systems reviewed and are negative.      Objective:   Physical Exam  Constitutional: She is oriented to person, place, and time. She appears well-developed and well-nourished.  HENT:  Nose: Nose normal.  Mouth/Throat: Oropharynx is clear and moist.  Eyes: EOM are normal.  Neck: Trachea normal, normal range of motion and full passive range of motion without pain. Neck supple. No JVD present. Carotid bruit is not present. No thyromegaly present.  Cardiovascular: Normal rate, regular rhythm, normal heart sounds and intact distal pulses.  Exam reveals no gallop and no friction rub.   No murmur heard. Pulmonary/Chest: Effort normal and breath sounds normal.  Crackles in bases bilaterally   Abdominal: Soft. Bowel sounds are normal. She exhibits no distension and no mass. There is no tenderness.  Musculoskeletal: Normal range of motion.  Lymphadenopathy:    She has no cervical adenopathy.  Neurological: She is alert and oriented to person, place, and time. She has normal reflexes.  Skin: Skin is warm and dry.  Psychiatric: She has a normal mood and affect. Her behavior is normal. Judgment and thought content normal.   BP 144/76  Pulse 60  Temp(Src) 97.7 F (36.5 C) (Oral)  Ht 5\' 4"  (1.626 m)  Wt 150 lb (68.04 kg)  BMI 25.73 kg/m2        Assessment & Plan:   1. Hypertension   2. Hyperlipidemia   3. Gout   4. Chronic kidney disease (CKD), stage IV (severe)   5. CAD (coronary artery disease)     Stop Smoking Continue all meds Labs pending Diet and exercise encouraged Health maintenance reviewed Follow up in 3 months   [place], FNP

## 2014-03-04 ENCOUNTER — Ambulatory Visit: Payer: Medicare Other | Admitting: Nurse Practitioner

## 2014-03-04 LAB — CMP14+EGFR
A/G RATIO: 2 (ref 1.1–2.5)
ALBUMIN: 4.7 g/dL (ref 3.5–4.8)
ALT: 13 IU/L (ref 0–32)
AST: 22 IU/L (ref 0–40)
Alkaline Phosphatase: 89 IU/L (ref 39–117)
BILIRUBIN TOTAL: 0.8 mg/dL (ref 0.0–1.2)
BUN/Creatinine Ratio: 19 (ref 11–26)
BUN: 29 mg/dL — ABNORMAL HIGH (ref 8–27)
CALCIUM: 9.6 mg/dL (ref 8.7–10.3)
CO2: 24 mmol/L (ref 18–29)
Chloride: 99 mmol/L (ref 97–108)
Creatinine, Ser: 1.55 mg/dL — ABNORMAL HIGH (ref 0.57–1.00)
GFR, EST AFRICAN AMERICAN: 37 mL/min/{1.73_m2} — AB (ref >59)
GFR, EST NON AFRICAN AMERICAN: 32 mL/min/{1.73_m2} — AB (ref >59)
Globulin, Total: 2.4 g/dL (ref 1.5–4.5)
Glucose: 81 mg/dL (ref 65–99)
POTASSIUM: 3.6 mmol/L (ref 3.5–5.2)
Sodium: 139 mmol/L (ref 134–144)
Total Protein: 7.1 g/dL (ref 6.0–8.5)

## 2014-03-04 LAB — NMR, LIPOPROFILE
CHOLESTEROL: 117 mg/dL (ref 100–199)
HDL CHOLESTEROL BY NMR: 38 mg/dL — AB (ref >39)
HDL PARTICLE NUMBER: 31.7 umol/L (ref >=30.5)
LDL Particle Number: 812 nmol/L (ref <1000)
LDL SIZE: 20.1 nm (ref >20.5)
LDLC SERPL CALC-MCNC: 57 mg/dL (ref 0–99)
LP-IR Score: 61 — ABNORMAL HIGH (ref <=45)
SMALL LDL PARTICLE NUMBER: 424 nmol/L (ref <=527)
TRIGLYCERIDES BY NMR: 112 mg/dL (ref 0–149)

## 2014-03-04 LAB — SPECIMEN STATUS REPORT

## 2014-03-07 ENCOUNTER — Telehealth: Payer: Self-pay | Admitting: Family Medicine

## 2014-03-07 NOTE — Telephone Encounter (Signed)
Message copied by Waverly Ferrari on Mon Mar 07, 2014  3:43 PM ------      Message from: [name]      Created: Mon Mar 07, 2014  1:54 PM       Kidney and liver function stable      Cholesterol looks great      Continue current meds- low fat diet and exercise and recheck in 3 months       ------

## 2014-05-17 ENCOUNTER — Telehealth: Payer: Self-pay | Admitting: Nurse Practitioner

## 2014-05-17 NOTE — Telephone Encounter (Signed)
She stated that she would call 911 for transport if symptoms returned.

## 2014-05-17 NOTE — Telephone Encounter (Signed)
Several days ago patient felt faint and experienced shaking in her foot that moved up to upper extremities. She was evaluated by EMS and they didn't find anything. She did not want transport but has since became concerned because her family is worried. Her symptoms resolved and she hasn't had a recurrence.  She has an appointment 06/03/14.  Appt scheduled for tomorrow. Patient made aware that this is for the acute incident and that her 3 month f/u is still scheduled for 06/03/14.

## 2014-05-18 ENCOUNTER — Ambulatory Visit (INDEPENDENT_AMBULATORY_CARE_PROVIDER_SITE_OTHER): Payer: Medicare Other | Admitting: Nurse Practitioner

## 2014-05-18 ENCOUNTER — Encounter: Payer: Self-pay | Admitting: Nurse Practitioner

## 2014-05-18 VITALS — BP 143/67 | HR 60 | Temp 97.5°F | Ht 64.0 in | Wt 151.8 lb

## 2014-05-18 DIAGNOSIS — R55 Syncope and collapse: Secondary | ICD-10-CM

## 2014-05-18 NOTE — Patient Instructions (Signed)
Syncope °Syncope is a medical term for fainting or passing out. This means you lose consciousness and drop to the ground. People are generally unconscious for less than 5 minutes. You may have some muscle twitches for up to 15 seconds before waking up and returning to normal. Syncope occurs more often in older adults, but it can happen to anyone. While most causes of syncope are not dangerous, syncope can be a sign of a serious medical problem. It is important to seek medical care.  °CAUSES  °Syncope is caused by a sudden drop in blood flow to the brain. The specific cause is often not determined. Factors that can bring on syncope include: °· Taking medicines that lower blood pressure. °· Sudden changes in posture, such as standing up quickly. °· Taking more medicine than prescribed. °· Standing in one place for too long. °· Seizure disorders. °· Dehydration and excessive exposure to heat. °· Low blood sugar (hypoglycemia). °· Straining to have a bowel movement. °· Heart disease, irregular heartbeat, or other circulatory problems. °· Fear, emotional distress, seeing blood, or severe pain. °SYMPTOMS  °Right before fainting, you may: °· Feel dizzy or light-headed. °· Feel nauseous. °· See all white or all black in your field of vision. °· Have cold, clammy skin. °DIAGNOSIS  °Your health care provider will ask about your symptoms, perform a physical exam, and perform an electrocardiogram (ECG) to record the electrical activity of your heart. Your health care provider may also perform other heart or blood tests to determine the cause of your syncope which may include: °· Transthoracic echocardiogram (TTE). During echocardiography, sound waves are used to evaluate how blood flows through your heart. °· Transesophageal echocardiogram (TEE). °· Cardiac monitoring. This allows your health care provider to monitor your heart rate and rhythm in real time. °· Holter monitor. This is a portable device that records your  heartbeat and can help diagnose heart arrhythmias. It allows your health care provider to track your heart activity for several days, if needed. °· Stress tests by exercise or by giving medicine that makes the heart beat faster. °TREATMENT  °In most cases, no treatment is needed. Depending on the cause of your syncope, your health care provider may recommend changing or stopping some of your medicines. °HOME CARE INSTRUCTIONS °· Have someone stay with you until you feel stable. °· Do not drive, use machinery, or play sports until your health care provider says it is okay. °· Keep all follow-up appointments as directed by your health care provider. °· Lie down right away if you start feeling like you might faint. Breathe deeply and steadily. Wait until all the symptoms have passed. °· Drink enough fluids to keep your urine clear or pale yellow. °· If you are taking blood pressure or heart medicine, get up slowly and take several minutes to sit and then stand. This can reduce dizziness. °SEEK IMMEDIATE MEDICAL CARE IF:  °· You have a severe headache. °· You have unusual pain in the chest, abdomen, or back. °· You are bleeding from your mouth or rectum, or you have black or tarry stool. °· You have an irregular or very fast heartbeat. °· You have pain with breathing. °· You have repeated fainting or seizure-like jerking during an episode. °· You faint when sitting or lying down. °· You have confusion. °· You have trouble walking. °· You have severe weakness. °· You have vision problems. °If you fainted, call your local emergency services (911 in [place] Do not drive   yourself to the hospital.  °MAKE SURE YOU: °· Understand these instructions. °· Will watch your condition. °· Will get help right away if you are not doing well or get worse. °Document Released: 09/23/2005 Document Revised: 09/28/2013 Document Reviewed: 11/22/2011 °Exit[employer]® Patient Information ©2015 [employer]. This information is not intended to replace  advice given to you by your health care provider. Make sure you discuss any questions you have with your health care provider. ° °

## 2014-05-18 NOTE — Progress Notes (Signed)
   Subjective:    Patient ID: Laura Jacobs, female    DOB: May 28, 1934, 80 y.o.   MRN: [id number]  HPI Patient in today saying that she got lightheaded and right arm and leg started shaking - lasted about 1  Minute and resolved on its own- Happened on the 4 of august- no more episdes since then. Denies chest , SOB or weakness since then.    Review of Systems  Constitutional: Negative.   HENT: Negative.   Respiratory: Negative.   Cardiovascular: Negative.   Genitourinary: Negative.   Neurological: Negative.   Psychiatric/Behavioral: Negative.   All other systems reviewed and are negative.      Objective:   Physical Exam  Constitutional: She is oriented to person, place, and time. She appears well-developed and well-nourished.  Cardiovascular: Normal rate, regular rhythm and normal heart sounds.   Pulmonary/Chest: Effort normal and breath sounds normal.  Musculoskeletal:  Grips equal bil Legs motor strength ans sensation intact and equal bil.  Neurological: She is alert and oriented to person, place, and time. She has normal reflexes. No cranial nerve deficit.  Skin: Skin is warm and dry.  Psychiatric: She has a normal mood and affect. Her behavior is normal. Judgment and thought content normal.    BP 143/67  Pulse 60  Temp(Src) 97.5 F (36.4 C) (Oral)  Ht 5\' 4"  (1.626 m)  Wt 151 lb 12.8 oz (68.856 kg)  BMI 26.04 kg/m2 Laura Nissen, FNP       Assessment & Plan:   1. Syncope, near    Labs pending Rest Force fluids RTO if reoccurs  Laura Hassell Done, FNP

## 2014-05-19 LAB — ANEMIA PROFILE B
Basophils Absolute: 0 10*3/uL (ref 0.0–0.2)
Basos: 0 %
Eos: 6 %
Eosinophils Absolute: 0.4 10*3/uL (ref 0.0–0.4)
FERRITIN: 108 ng/mL (ref 15–150)
HCT: 34.1 % (ref 34.0–46.6)
Hemoglobin: 12.2 g/dL (ref 11.1–15.9)
IMMATURE GRANS (ABS): 0 10*3/uL (ref 0.0–0.1)
IMMATURE GRANULOCYTES: 0 %
IRON SATURATION: 36 % (ref 15–55)
Iron: 112 ug/dL (ref 35–155)
Lymphocytes Absolute: 2.5 10*3/uL (ref 0.7–3.1)
Lymphs: 32 %
MCH: 31.9 pg (ref 26.6–33.0)
MCHC: 35.8 g/dL — ABNORMAL HIGH (ref 31.5–35.7)
MCV: 89 fL (ref 79–97)
MONOCYTES: 11 %
MONOS ABS: 0.9 10*3/uL (ref 0.1–0.9)
NEUTROS PCT: 51 %
Neutrophils Absolute: 3.9 10*3/uL (ref 1.4–7.0)
PLATELETS: 194 10*3/uL (ref 150–379)
RBC: 3.82 x10E6/uL (ref 3.77–5.28)
RDW: 13 % (ref 12.3–15.4)
Retic Ct Pct: 1.8 % (ref 0.6–2.6)
TIBC: 311 ug/dL (ref 250–450)
UIBC: 199 ug/dL (ref 150–375)
VITAMIN B 12: 396 pg/mL (ref 211–946)
WBC: 7.8 10*3/uL (ref 3.4–10.8)

## 2014-05-19 LAB — CMP14+EGFR
ALK PHOS: 87 IU/L (ref 39–117)
ALT: 17 IU/L (ref 0–32)
AST: 24 IU/L (ref 0–40)
Albumin/Globulin Ratio: 1.6 (ref 1.1–2.5)
Albumin: 4.6 g/dL (ref 3.5–4.8)
BILIRUBIN TOTAL: 1.1 mg/dL (ref 0.0–1.2)
BUN/Creatinine Ratio: 14 (ref 11–26)
BUN: 21 mg/dL (ref 8–27)
CHLORIDE: 97 mmol/L (ref 97–108)
CO2: 26 mmol/L (ref 18–29)
Calcium: 9.4 mg/dL (ref 8.7–10.3)
Creatinine, Ser: 1.5 mg/dL — ABNORMAL HIGH (ref 0.57–1.00)
GFR calc non Af Amer: 33 mL/min/{1.73_m2} — ABNORMAL LOW (ref >59)
GFR, EST AFRICAN AMERICAN: 38 mL/min/{1.73_m2} — AB (ref >59)
GLUCOSE: 88 mg/dL (ref 65–99)
Globulin, Total: 2.8 g/dL (ref 1.5–4.5)
Potassium: 3.9 mmol/L (ref 3.5–5.2)
Sodium: 141 mmol/L (ref 134–144)
Total Protein: 7.4 g/dL (ref 6.0–8.5)

## 2014-06-03 ENCOUNTER — Ambulatory Visit (INDEPENDENT_AMBULATORY_CARE_PROVIDER_SITE_OTHER): Payer: Medicare Other | Admitting: Nurse Practitioner

## 2014-06-03 ENCOUNTER — Encounter: Payer: Self-pay | Admitting: Nurse Practitioner

## 2014-06-03 ENCOUNTER — Ambulatory Visit (INDEPENDENT_AMBULATORY_CARE_PROVIDER_SITE_OTHER): Payer: Medicare Other

## 2014-06-03 VITALS — BP 127/75 | HR 54 | Temp 97.0°F | Ht 64.0 in | Wt 150.0 lb

## 2014-06-03 DIAGNOSIS — M1A00X0 Idiopathic chronic gout, unspecified site, without tophus (tophi): Secondary | ICD-10-CM

## 2014-06-03 DIAGNOSIS — R0989 Other specified symptoms and signs involving the circulatory and respiratory systems: Secondary | ICD-10-CM

## 2014-06-03 DIAGNOSIS — N184 Chronic kidney disease, stage 4 (severe): Secondary | ICD-10-CM

## 2014-06-03 DIAGNOSIS — I2510 Atherosclerotic heart disease of native coronary artery without angina pectoris: Secondary | ICD-10-CM

## 2014-06-03 DIAGNOSIS — Z13820 Encounter for screening for osteoporosis: Secondary | ICD-10-CM

## 2014-06-03 DIAGNOSIS — I10 Essential (primary) hypertension: Secondary | ICD-10-CM

## 2014-06-03 DIAGNOSIS — M1A9XX0 Chronic gout, unspecified, without tophus (tophi): Secondary | ICD-10-CM

## 2014-06-03 DIAGNOSIS — E785 Hyperlipidemia, unspecified: Secondary | ICD-10-CM

## 2014-06-03 MED ORDER — METOPROLOL TARTRATE 50 MG PO TABS: 50.0000 mg | ORAL_TABLET | Freq: Two times a day (BID) | ORAL | Status: DC

## 2014-06-03 MED ORDER — ATORVASTATIN CALCIUM 40 MG PO TABS: 40.0000 mg | ORAL_TABLET | Freq: Every day | ORAL | Status: DC

## 2014-06-03 MED ORDER — AMLODIPINE BESYLATE 5 MG PO TABS: 5.0000 mg | ORAL_TABLET | Freq: Every day | ORAL | Status: DC

## 2014-06-03 MED ORDER — TRIAMTERENE-HCTZ 37.5-25 MG PO TABS: 1.0000 | ORAL_TABLET | Freq: Every day | ORAL | Status: DC

## 2014-06-03 MED ORDER — EZETIMIBE 10 MG PO TABS: 10.0000 mg | ORAL_TABLET | Freq: Every day | ORAL | Status: DC

## 2014-06-03 NOTE — Patient Instructions (Signed)

## 2014-06-03 NOTE — Progress Notes (Signed)
Subjective:    Patient ID: Laura Jacobs, female    DOB: January 21, 1934, 80 y.o.   MRN: [id number]  Patient here today for follow up of chronic medical problems. No complaints today   Hypertension This is a chronic problem. The current episode started more than 1 year ago. The problem is unchanged. The problem is controlled. Pertinent negatives include no blurred vision, chest pain, headaches, neck pain, orthopnea, peripheral edema, shortness of breath or sweats. There are no associated agents to hypertension. Risk factors for coronary artery disease include dyslipidemia, post-menopausal state, sedentary lifestyle and smoking/tobacco exposure. Past treatments include calcium channel blockers, beta blockers and diuretics. The current treatment provides significant improvement. Compliance problems include diet and exercise.  Hypertensive end-organ damage includes kidney disease and CAD/MI (MI in 1998). Identifiable causes of hypertension include chronic renal disease.  Hyperlipidemia This is a chronic problem. The current episode started more than 1 year ago. The problem is uncontrolled. Recent lipid tests were reviewed and are high. Exacerbating diseases include chronic renal disease. She has no history of diabetes. Factors aggravating her hyperlipidemia include smoking and fatty foods. Pertinent negatives include no chest pain or shortness of breath. Current antihyperlipidemic treatment includes statins and ezetimibe. The current treatment provides moderate improvement of lipids. Compliance problems include adherence to diet and adherence to exercise.  Risk factors for coronary artery disease include hypertension, post-menopausal, a sedentary lifestyle and dyslipidemia.  GOUT Last flareup was several months ago- doing well right now- on no maintenance meds CAD Bypass 1998- no need to use nitrogylcerin at ome. Chronic kidney disease See nephrologist every 6 months-       Review of Systems   Eyes: Negative for blurred vision.  Respiratory: Negative for shortness of breath.   Cardiovascular: Negative for chest pain and orthopnea.  Musculoskeletal: Negative for neck pain.  Neurological: Negative for headaches.  All other systems reviewed and are negative.      Objective:   Physical Exam  Constitutional: She is oriented to person, place, and time. She appears well-developed and well-nourished.  HENT:  Nose: Nose normal.  Mouth/Throat: Oropharynx is clear and moist.  Eyes: EOM are normal.  Neck: Trachea normal, normal range of motion and full passive range of motion without pain. Neck supple. No JVD present. Carotid bruit is not present. No thyromegaly present.  Cardiovascular: Normal rate, regular rhythm, normal heart sounds and intact distal pulses.  Exam reveals no gallop and no friction rub.   No murmur heard. Pulmonary/Chest: Effort normal and breath sounds normal.  Crackles in bases bilaterally   Abdominal: Soft. Bowel sounds are normal. She exhibits no distension and no mass. There is no tenderness.  Musculoskeletal: Normal range of motion.  Lymphadenopathy:    She has no cervical adenopathy.  Neurological: She is alert and oriented to person, place, and time. She has normal reflexes.  Skin: Skin is warm and dry.  Psychiatric: She has a normal mood and affect. Her behavior is normal. Judgment and thought content normal.   BP 127/75  Pulse 54  Temp(Src) 97 F (36.1 C) (Oral)  Ht _0  (1.626 m)  Wt 150 lb (68.04 kg)  BMI 25.73 kg/m2  Chest x ray clear-Preliminary reading by Ronnald Collum, FNP  [facility]        Assessment & Plan:   1. Essential hypertension   2. Hyperlipidemia   3. Chronic gout without tophus, unspecified cause, unspecified site   4. Chronic kidney disease (CKD), stage IV (severe)   5. Coronary  artery disease involving native coronary artery of native heart without angina pectoris   6. Chest rales   7. Screening for osteoporosis    Orders  Placed This Encounter  Procedures  . DG Chest 2 View    Standing Status: Future     Number of Occurrences: 1     Standing Expiration Date: 08/03/2015    Order Specific Question:  Reason for Exam (SYMPTOM  OR DIAGNOSIS REQUIRED)    Answer:  ralesright lower  lobe    Order Specific Question:  Preferred imaging location?    Answer:  Internal  . DG Bone Density    Standing Status: Future     Number of Occurrences:      Standing Expiration Date: 08/03/2015    Order Specific Question:  Reason for Exam (SYMPTOM  OR DIAGNOSIS REQUIRED)    Answer:  screening    Order Specific Question:  Preferred imaging location?    Answer:  Internal  . CMP14+EGFR  . NMR, lipoprofile   Meds ordered this encounter  Medications  . ezetimibe (ZETIA) 10 MG tablet    Sig: Take 1 tablet (10 mg total) by mouth daily.    Dispense:  30 tablet    Refill:  5    Order Specific Question:  Supervising Provider    Answer:  Chipper Herb [[id number]]  . atorvastatin (LIPITOR) 40 MG tablet    Sig: Take 1 tablet (40 mg total) by mouth daily.    Dispense:  30 tablet    Refill:  5    Order Specific Question:  Supervising Provider    Answer:  Chipper Herb [[id number]]  . metoprolol (LOPRESSOR) 50 MG tablet    Sig: Take 1 tablet (50 mg total) by mouth 2 (two) times daily.    Dispense:  60 tablet    Refill:  5    Order Specific Question:  Supervising Provider    Answer:  Chipper Herb [[id number]]  . triamterene-hydrochlorothiazide (MAXZIDE-25) 37.5-25 MG per tablet    Sig: Take 1 tablet by mouth daily.    Dispense:  30 tablet    Refill:  5    Order Specific Question:  Supervising Provider    Answer:  Chipper Herb [[id number]]  . amLODipine (NORVASC) 5 MG tablet    Sig: Take 1 tablet (5 mg total) by mouth daily.    Dispense:  30 tablet    Refill:  5    Order Specific Question:  Supervising Provider    Answer:  Chipper Herb [[id number]]    Labs pending Health maintenance reviewed Diet and exercise encouraged Continue all  meds Follow up  In 3 months   [place], FNP

## 2014-06-04 LAB — NMR, LIPOPROFILE
Cholesterol: 109 mg/dL (ref 100–199)
HDL Cholesterol by NMR: 37 mg/dL — ABNORMAL LOW (ref >39)
HDL Particle Number: 28.5 umol/L — ABNORMAL LOW (ref >=30.5)
LDL Particle Number: 561 nmol/L (ref <1000)
LDL SIZE: 20.4 nm (ref >20.5)
LDLC SERPL CALC-MCNC: 46 mg/dL (ref 0–99)
LP-IR Score: 59 — ABNORMAL HIGH (ref <=45)
SMALL LDL PARTICLE NUMBER: 259 nmol/L (ref <=527)
Triglycerides by NMR: 128 mg/dL (ref 0–149)

## 2014-06-04 LAB — CMP14+EGFR
ALT: 12 IU/L (ref 0–32)
AST: 23 IU/L (ref 0–40)
Albumin/Globulin Ratio: 1.6 (ref 1.1–2.5)
Albumin: 4.4 g/dL (ref 3.5–4.8)
Alkaline Phosphatase: 81 IU/L (ref 39–117)
BUN/Creatinine Ratio: 14 (ref 11–26)
BUN: 23 mg/dL (ref 8–27)
CO2: 25 mmol/L (ref 18–29)
Calcium: 9.7 mg/dL (ref 8.7–10.3)
Chloride: 98 mmol/L (ref 97–108)
Creatinine, Ser: 1.62 mg/dL — ABNORMAL HIGH (ref 0.57–1.00)
GFR calc Af Amer: 35 mL/min/1.73 — ABNORMAL LOW (ref >59)
GFR calc non Af Amer: 30 mL/min/1.73 — ABNORMAL LOW (ref >59)
Globulin, Total: 2.8 g/dL (ref 1.5–4.5)
Glucose: 84 mg/dL (ref 65–99)
Potassium: 4 mmol/L (ref 3.5–5.2)
Sodium: 141 mmol/L (ref 134–144)
Total Bilirubin: 1 mg/dL (ref 0.0–1.2)
Total Protein: 7.2 g/dL (ref 6.0–8.5)

## 2014-08-22 ENCOUNTER — Telehealth: Payer: Self-pay | Admitting: Nurse Practitioner

## 2014-08-22 ENCOUNTER — Emergency Department (HOSPITAL_COMMUNITY): Payer: PRIVATE HEALTH INSURANCE

## 2014-08-22 ENCOUNTER — Emergency Department (HOSPITAL_COMMUNITY)
Admission: EM | Admit: 2014-08-22 | Discharge: 2014-08-23 | Disposition: A | Discharge status: Home / Self Care | Payer: PRIVATE HEALTH INSURANCE | Attending: Emergency Medicine | Admitting: Emergency Medicine

## 2014-08-22 ENCOUNTER — Encounter (HOSPITAL_COMMUNITY): Payer: Self-pay | Admitting: Emergency Medicine

## 2014-08-22 DIAGNOSIS — I252 Old myocardial infarction: Secondary | ICD-10-CM | POA: Diagnosis not present

## 2014-08-22 DIAGNOSIS — Z87891 Personal history of nicotine dependence: Secondary | ICD-10-CM | POA: Diagnosis not present

## 2014-08-22 DIAGNOSIS — R002 Palpitations: Secondary | ICD-10-CM

## 2014-08-22 DIAGNOSIS — Z87448 Personal history of other diseases of urinary system: Secondary | ICD-10-CM | POA: Insufficient documentation

## 2014-08-22 DIAGNOSIS — I4891 Unspecified atrial fibrillation: Secondary | ICD-10-CM | POA: Insufficient documentation

## 2014-08-22 DIAGNOSIS — Z79899 Other long term (current) drug therapy: Secondary | ICD-10-CM | POA: Insufficient documentation

## 2014-08-22 DIAGNOSIS — E785 Hyperlipidemia, unspecified: Secondary | ICD-10-CM | POA: Insufficient documentation

## 2014-08-22 DIAGNOSIS — I10 Essential (primary) hypertension: Secondary | ICD-10-CM | POA: Insufficient documentation

## 2014-08-22 LAB — I-STAT TROPONIN, ED
TROPONIN I, POC: 0.02 ng/mL (ref 0.00–0.08)
TROPONIN I, POC: 0.02 ng/mL (ref 0.00–0.08)

## 2014-08-22 LAB — CBC
HEMATOCRIT: 35 % — AB (ref 36.0–46.0)
HEMOGLOBIN: 12.5 g/dL (ref 12.0–15.0)
MCH: 31.6 pg (ref 26.0–34.0)
MCHC: 35.7 g/dL (ref 30.0–36.0)
MCV: 88.6 fL (ref 78.0–100.0)
Platelets: 181 10*3/uL (ref 150–400)
RBC: 3.95 MIL/uL (ref 3.87–5.11)
RDW: 12.4 % (ref 11.5–15.5)
WBC: 9.8 10*3/uL (ref 4.0–10.5)

## 2014-08-22 LAB — BASIC METABOLIC PANEL
ANION GAP: 17 — AB (ref 5–15)
BUN: 20 mg/dL (ref 6–23)
CO2: 23 meq/L (ref 19–32)
CREATININE: 1.23 mg/dL — AB (ref 0.50–1.10)
Calcium: 8.9 mg/dL (ref 8.4–10.5)
Chloride: 97 mEq/L (ref 96–112)
GFR calc Af Amer: 47 mL/min — ABNORMAL LOW (ref >90)
GFR calc non Af Amer: 41 mL/min — ABNORMAL LOW (ref >90)
Glucose, Bld: 126 mg/dL — ABNORMAL HIGH (ref 70–99)
Potassium: 3.4 mEq/L — ABNORMAL LOW (ref 3.7–5.3)
SODIUM: 137 meq/L (ref 137–147)

## 2014-08-22 MED ORDER — POTASSIUM CHLORIDE CRYS ER 20 MEQ PO TBCR
40.0000 meq | EXTENDED_RELEASE_TABLET | Freq: Once | ORAL | Status: AC
  Administered 2014-08-22: 40 meq via ORAL
  Filled 2014-08-22: qty 2

## 2014-08-22 MED ORDER — DILTIAZEM HCL 25 MG/5ML IV SOLN
17.0000 mg | Freq: Once | INTRAVENOUS | Status: AC
  Administered 2014-08-22: 17 mg via INTRAVENOUS
  Filled 2014-08-22: qty 5

## 2014-08-22 MED ORDER — APIXABAN 5 MG PO TABS
5.0000 mg | ORAL_TABLET | Freq: Once | ORAL | Status: AC
  Administered 2014-08-22: 5 mg via ORAL
  Filled 2014-08-22: qty 1

## 2014-08-22 NOTE — Consult Note (Signed)
CARDIOLOGY CONSULT NOTE   Patient ID: Laura Jacobs MRN: [id number], DOB/AGE: [birth year]   Admit date: 08/22/2014 Date of Consult: 08/22/2014   Primary Physician: Laura Pretty, FNP Primary Cardiologist: See by Dr. Wynonia Jacobs years ago  Pt. Profile  80F with HTN, HLD, CAD s/p MI and CABG (1998), gout, who presents with RVR, now resolved.   Problem List  Past Medical History  Diagnosis Date  . Hyperlipidemia   . Hypertension   . Acute MI inferior posterior subsequent episode care   . Renal failure     Past Surgical History  Procedure Laterality Date  . Heart by pass       Allergies  No Known Allergies  HPI 80F with HTN, HLD, CAD s/p MI and CABG (1998), gout, who presents with RVR, now resolved.   Laura Jacobs reports that she has had brief periods of intermittent palpitations for the past 2 months. She had a difficult time stating how long they have been lasted. She was seen by her PCP for a presyncopal episode in August but states she did not have palpitations associated with that episode.   For the past 3 days, she has been in bed with a cold that has been characterized by runny nose and fatigue. This morning, she woke up a felt her "heart running around like crazy" with associated dizziness. No SOB, chest pain, chest pressure, syncope, or presyncope. ROS only otherwise notable for some mild abdominal pain and constipation.   She went to [facility] urgent care for evaluation and was found to be in rapid AF and sent to [facility] ED. She was initially on a diltiazem drip due to AF with RVR in the 150s but then she converted to NSR. labs were notable for K 3.4, Cr 1.23, POC TnI 0.02 x 2. On my exam, she felt back to normal.   She denies a personal or family history of arrhyhtmia. No history of thyroid disorders or symptoms c/w hyperthyroid (shaking, hair loss, diarrhea, anxiety). No history of GI/GU/intracranal or any other sort of bleeding. She is on metoprolol 50mg   BID at home. She has smoked for years but smokes very little (1 pack per month) now. No history c/w stroke or TIA.   Inpatient Medications No current facility-administered medications on file prior to encounter.   Current Outpatient Prescriptions on File Prior to Encounter  Medication Sig Dispense Refill  . amLODipine (NORVASC) 5 MG tablet Take 1 tablet (5 mg total) by mouth daily. 30 tablet 5  . atorvastatin (LIPITOR) 40 MG tablet Take 1 tablet (40 mg total) by mouth daily. 30 tablet 5  . ezetimibe (ZETIA) 10 MG tablet Take 1 tablet (10 mg total) by mouth daily. 30 tablet 5  . metoprolol (LOPRESSOR) 50 MG tablet Take 1 tablet (50 mg total) by mouth 2 (two) times daily. 60 tablet 5  . nitroGLYCERIN (NITROSTAT) 0.4 MG SL tablet Place 1 tablet (0.4 mg total) under the tongue every 5 (five) minutes as needed for chest pain. 26 tablet 2  . triamterene-hydrochlorothiazide (MAXZIDE-25) 37.5-25 MG per tablet Take 1 tablet by mouth daily. 30 tablet 5      Family History Family History  Problem Relation Age of Onset  . Heart disease Mother      Social History History   Social History  . Marital Status: Married    Spouse Name: N/A    Number of Children: N/A  . Years of Education: N/A   Occupational History  . Not on file.  Social History Main Topics  . Smoking status: Former Smoker -- 0.50 packs/day for 60 years    Types: Cigarettes  . Smokeless tobacco: Former Systems developer    Quit date: 05/11/2014  . Alcohol Use: No  . Drug Use: No  . Sexual Activity: Not on file   Other Topics Concern  . Not on file   Social History Narrative     Review of Systems  General:  No chills, fever, night sweats or weight changes.  Cardiovascular:  No chest pain, dyspnea on exertion, edema, orthopnea, paroxysmal nocturnal dyspnea. + palpitations.  Dermatological: No rash, lesions/masses Respiratory: No cough, dyspnea Urologic: No hematuria, dysuria Abdominal:   No nausea, vomiting, diarrhea, bright  red blood per rectum, melena, or hematemesis Neurologic:  No visual changes, wkns, changes in mental status. All other systems reviewed and are otherwise negative except as noted above.  Physical Exam  Blood pressure 138/61, pulse 84, temperature 99.1 F (37.3 C), temperature source Oral, resp. rate 28, SpO2 94 %.  General: Pleasant, NAD Psych: Normal affect. Neuro: Alert and oriented X 3. Moves all extremities spontaneously. HEENT: Normal  Neck: Supple without bruits or JVD. Lungs:  Resp regular and unlabored, CTA. Heart: RRR no s3, s4, 2/6 holosystolic murmur at the base. Abdomen: Soft, non-tender, non-distended, BS + x 4.  Extremities: No clubbing, cyanosis or edema. DP/PT/Radials 2+ and equal bilaterally.  Labs  No results for input(s): CKTOTAL, CKMB, TROPONINI in the last 72 hours. Lab Results  Component Value Date   WBC 9.8 08/22/2014   HGB 12.5 08/22/2014   HCT 35.0* 08/22/2014   MCV 88.6 08/22/2014   PLT 181 08/22/2014    Recent Labs Lab 08/22/14 1826  NA 137  K 3.4*  CL 97  CO2 23  BUN 20  CREATININE 1.23*  CALCIUM 8.9  GLUCOSE 126*   Lab Results  Component Value Date   CHOL 109 06/03/2014   HDL 37* 06/03/2014   LDLCALC 46 06/03/2014   TRIG 128 06/03/2014   No results found for: DDIMER  Radiology/Studies  Dg Chest 2 View  08/22/2014   CLINICAL DATA:  Palpitations.  History of bypass and hypertension.  EXAM: CHEST  2 VIEW  COMPARISON:  08/22/2014  FINDINGS: Postoperative changes in the mediastinum. Heart size and pulmonary vascularity are normal. Emphysematous changes in the lungs with diffuse interstitial pattern likely due to fibrosis. Bilateral apical pleural calcification. Focal nodular infiltration in the right perihilar region with possible cavitation. Suggest follow-up with elective CT to exclude underlying pulmonary nodule versus cavitary infiltration. This is progressing since previous studies. No blunting of costophrenic angles. No pneumothorax.   IMPRESSION: Chronic emphysematous changes and fibrosis in the lungs. Developing nodular infiltration in the right perihilar region with possible cavitation.   Electronically Signed   By: Lucienne Capers M.D.   On: 08/22/2014 21:32   Dg Chest Port 1 View  08/22/2014   CLINICAL DATA:  Atrial fibrillation, history of hypertension and prior cardiac surgery  EXAM: PORTABLE CHEST - 1 VIEW  COMPARISON:  06/03/2014  FINDINGS: Cardiac shadow is within normal limits. Postsurgical changes are again seen. The lungs are hyperinflated bilaterally. Faint increased density is noted in the right mid lung and parahilar area which may represent an early infiltrate.  IMPRESSION: Mild increased density in the right mid lung suggestive of early infiltrate.   Electronically Signed   By: Inez Catalina M.D.   On: 08/22/2014 18:48    ECG  08/22/14 @ 20:46: NSR. NSSTTWC with borderline  STD in V3/4 08/22/14 @ 18:17: AF with RVR. Rates ~150bpm. 73mm ST depression in V2 and V3. Sub mm depression in V4-5. 2 PVCs  05/18/14: NSR NSSTTWC. Borderline STD in [place] 85F with HTN, HLD, CAD s/p MI and CABG (1998), gout, who presents with RVR, now resolved. Suspect she has been having pAF for the past 2 months and this month lasted longer due to her cold. She is not in HF and if anything is on the dry side. Although she had mild ST depressions while very tachycardic, she was chest pain free, had 2 negative trops, and actually has some subtle ST depressions on her baseline ECG and so I do not think any additional w/u is required as an inpatient.   We spent time discussing priorities of treating AF including stroke prevention, rate control, and in certain cases rhythm control. We discussed that with a CHADS VASC of 5 (HTN =1, Age = 2, Female Sex = 1, CAD = 1) she has a 7.2% annual risk of stroke and 10% annual risk for stroke, systemic embolism or TIA. Given her relatively low risk of bleeding I strongly recommended  initiating systemic anticoagulation and the patient and family agree. They wish to avoid warfarin and would like to start a NOAC. Based on the information they provided, it appears that the monthly co-pay should be about $50 and this is okay with them.   So, will plan for the following:  1. Start apixiban 5mg  BID; if she cannot get a dose prior to discharge from ED, she will get a dose of weight based lovenox 2. Stop baby aspirin 3. Increase metoprolol to 75mg  qAM and 50mg  qPM. She can take a 25mg  tablet if she has a recurrence of AF 4. Replete K - 68meq Kdur 5. Mg pending - please replete to 2.0 if needed 6. TSH was added on; this can be followed up in outpatient setting 7. I will arrange for cardiology following within a week   Signed, Lamar Sprinkles, MD 08/22/2014, 10:34 PM

## 2014-08-22 NOTE — Telephone Encounter (Signed)
Pt wtbs with MMM only and we don't have anything with MMM today or tomorrow, pt states she will go to urgent care, will close encounter

## 2014-08-22 NOTE — ED Notes (Signed)
Pt went to [facility] urgent care for fast heart rate. EMS states urgent care found new onset a fib with RVR, however pt states she has had an irregular heart rate for years. Pt denies sob, chest pain, nausea, vomiting, etc. Pt has cardiac hx with 7 way bypass in 1998.

## 2014-08-22 NOTE — ED Provider Notes (Signed)
CSN: [id number]     Arrival date & time 08/22/14  1808 History   First MD Initiated Contact with Patient 08/22/14 1828     Chief Complaint  Patient presents with  . Atrial Fibrillation     (Consider location/radiation/quality/duration/timing/severity/associated sxs/prior Treatment) HPI Comments: Patient with history of MI/CABG, hypertension, high cholesterol -- presents with complaint of palpitations starting last night. Patient had a sensation of heart racing. She was unable to get in with her primary care physician today so she went to [facility] urgent care where she was found to have probable new onset A. Fib with RVR. Patient denies having any shortness of breath, chest pain, N/V, lightheadedness or syncope. Patient states that she has had intermittent palpitations in the past, especially when she lays on her left side. Records reveal PCP visit for near syncope several months ago.  Patient is a 80 y.o. female presenting with atrial fibrillation. The history is provided by the patient and medical records.  Atrial Fibrillation Pertinent negatives include no abdominal pain, chest pain, coughing, diaphoresis, fever, headaches, myalgias, nausea, neck pain, rash, sore throat or vomiting.    Past Medical History  Diagnosis Date  . Hyperlipidemia   . Hypertension   . Acute MI inferior posterior subsequent episode care   . Renal failure    Past Surgical History  Procedure Laterality Date  . Heart by pass     Family History  Problem Relation Age of Onset  . Heart disease Mother    History  Substance Use Topics  . Smoking status: Former Smoker -- 0.50 packs/day for 60 years    Types: Cigarettes  . Smokeless tobacco: Former Systems developer    Quit date: 05/11/2014  . Alcohol Use: No   OB History    No data available     Review of Systems  Constitutional: Negative for fever and diaphoresis.  HENT: Negative for rhinorrhea and sore throat.   Eyes: Negative for redness.  Respiratory:  Negative for cough and shortness of breath.   Cardiovascular: Positive for palpitations. Negative for chest pain and leg swelling.  Gastrointestinal: Negative for nausea, vomiting, abdominal pain and diarrhea.  Genitourinary: Negative for dysuria.  Musculoskeletal: Negative for myalgias, back pain and neck pain.  Skin: Negative for rash.  Neurological: Negative for syncope, light-headedness and headaches.    Allergies  Review of patient's allergies indicates no known allergies.  Home Medications   Prior to Admission medications   Medication Sig Start Date End Date Taking? Authorizing Provider  amLODipine (NORVASC) 5 MG tablet Take 1 tablet (5 mg total) by mouth daily. 06/03/14   [name], FNP  atorvastatin (LIPITOR) 40 MG tablet Take 1 tablet (40 mg total) by mouth daily. 06/03/14   [name], FNP  COLCRYS 0.6 MG tablet  01/12/13   Historical Provider, MD  ezetimibe (ZETIA) 10 MG tablet Take 1 tablet (10 mg total) by mouth daily. 06/03/14   [name], FNP  metoprolol (LOPRESSOR) 50 MG tablet Take 1 tablet (50 mg total) by mouth 2 (two) times daily. 06/03/14   [name], FNP  nitroGLYCERIN (NITROSTAT) 0.4 MG SL tablet Place 1 tablet (0.4 mg total) under the tongue every 5 (five) minutes as needed for chest pain. 07/01/13   Chipper Herb, MD  triamterene-hydrochlorothiazide (MAXZIDE-25) 37.5-25 MG per tablet Take 1 tablet by mouth daily. 06/03/14   [name], FNP   BP 137/75 mmHg  Pulse 151  Temp(Src) 99 F (37.2 C) (Oral)  Resp 25  SpO2 95%  Physical Exam  Constitutional: She appears well-developed and well-nourished.  HENT:  Head: Normocephalic and atraumatic.  Mouth/Throat: Mucous membranes are normal. Mucous membranes are not dry.  Eyes: Conjunctivae are normal. Right eye exhibits no discharge. Left eye exhibits no discharge.  Neck: Trachea normal and normal range of motion. Neck supple. Normal carotid pulses and no JVD present.  No muscular tenderness present. Carotid bruit is not present. No tracheal deviation present.  Cardiovascular: Regular rhythm, S1 normal, S2 normal, normal heart sounds and intact distal pulses.  Tachycardia present.  Exam reveals no decreased pulses.   No murmur heard. Pulses:      Radial pulses are 2+ on the right side, and 2+ on the left side.  Tachy 130-150  Pulmonary/Chest: Effort normal and breath sounds normal. No respiratory distress. She has no wheezes. She exhibits no tenderness.  Abdominal: Soft. Normal aorta and bowel sounds are normal. There is no tenderness. There is no rebound and no guarding.  Musculoskeletal: Normal range of motion.  Neurological: She is alert.  Skin: Skin is warm and dry. She is not diaphoretic. No cyanosis. No pallor.  Psychiatric: She has a normal mood and affect.  Nursing note and vitals reviewed.   ED Course  Procedures (including critical care time) Labs Review Labs Reviewed  BASIC METABOLIC PANEL - Abnormal; Notable for the following:    Potassium 3.4 (*)    Glucose, Bld 126 (*)    Creatinine, Ser 1.23 (*)    GFR calc non Af Amer 41 (*)    GFR calc Af Amer 47 (*)    Anion gap 17 (*)    All other components within normal limits  CBC - Abnormal; Notable for the following:    HCT 35.0 (*)    All other components within normal limits  URINALYSIS, ROUTINE W REFLEX MICROSCOPIC - Abnormal; Notable for the following:    Color, Urine AMBER (*)    Hgb urine dipstick MODERATE (*)    Bilirubin Urine SMALL (*)    Ketones, ur 15 (*)    Protein, ur 30 (*)    Leukocytes, UA SMALL (*)    All other components within normal limits  URINE MICROSCOPIC-ADD ON - Abnormal; Notable for the following:    Squamous Epithelial / LPF FEW (*)    Bacteria, UA FEW (*)    Casts HYALINE CASTS (*)    All other components within normal limits  MAGNESIUM  TSH  I-STAT TROPOININ, ED  Randolm Idol, ED    Imaging Review Dg Chest 2 View  08/22/2014   CLINICAL  DATA:  Palpitations.  History of bypass and hypertension.  EXAM: CHEST  2 VIEW  COMPARISON:  08/22/2014  FINDINGS: Postoperative changes in the mediastinum. Heart size and pulmonary vascularity are normal. Emphysematous changes in the lungs with diffuse interstitial pattern likely due to fibrosis. Bilateral apical pleural calcification. Focal nodular infiltration in the right perihilar region with possible cavitation. Suggest follow-up with elective CT to exclude underlying pulmonary nodule versus cavitary infiltration. This is progressing since previous studies. No blunting of costophrenic angles. No pneumothorax.  IMPRESSION: Chronic emphysematous changes and fibrosis in the lungs. Developing nodular infiltration in the right perihilar region with possible cavitation.   Electronically Signed   By: Lucienne Capers M.D.   On: 08/22/2014 21:32   Dg Chest Port 1 View  08/22/2014   CLINICAL DATA:  Atrial fibrillation, history of hypertension and prior cardiac surgery  EXAM: PORTABLE CHEST - 1 VIEW  COMPARISON:  06/03/2014  FINDINGS: Cardiac shadow is within normal limits. Postsurgical changes are again seen. The lungs are hyperinflated bilaterally. Faint increased density is noted in the right mid lung and parahilar area which may represent an early infiltrate.  IMPRESSION: Mild increased density in the right mid lung suggestive of early infiltrate.   Electronically Signed   By: Inez Catalina M.D.   On: 08/22/2014 18:48     EKG Interpretation   Date/Time:  Monday August 22 2014 18:17:03 EST Ventricular Rate:  153 PR Interval:  95 QRS Duration: 82 QT Interval:  247 QTC Calculation: 394 R Axis:   79 Text Interpretation:  atrial fibrillation w/ RVR Multiple ventricular  premature complexes Repolarization abnormality, prob rate related  Confirmed by HARRISON  MD, FORREST ([id number]) on 08/22/2014 6:52:28 PM      6:48 PM Patient seen and examined. Work-up initiated. Medications ordered. Discussed with  and seen by Dr. Aline Brochure prior to treatment. EKG reviewed. Pt stable. HR varies between 120 and 150 with frequent PVCs.   Vital signs reviewed and are as follows: BP 137/75 mmHg  Pulse 151  Temp(Src) 99 F (37.2 C) (Oral)  Resp 25  SpO2 95%   7:32 PM HR 110-120. BP 99991111 systolic. Continues in afib.   Patient converted to NSR after IV cardizem bolus. Spoke with cardiology who will see.   11:37 PM Seen by cards. Will start apixaban at 5mg  bid. Pending Mag, UA.   Adjust metoprolol to 75mg  in morning, 50mg  in night, 25mg  with tachycardia.   She is to f/u with cardiology.   1:22 AM discharge instructions reviewed with patient and family at bedside. I have written down all instructions. I have sent a message to the patient's primary care provider requesting close follow-up.  Patient encouraged to return with chest pain, shortness of breath, lightheadedness or if she were to pass out. Patient and family verbalized understanding and agree with plan.  Patient also informed of pulmonary nodule which will need to be evaluated by her primary care physician.   MDM   Final diagnoses:  Palpitations  Atrial fibrillation with RVR   Patients with new onset of atrial fibrillation with RVR. She converted after 1 dose of IV Cardizem. Patient seen by cardiology here. Labs are normal and she has remained in normal sinus rhythm. Cardiology has cleared for discharge with the above instructions. Patient and family seem reliable to follow-up as planned.   No dangerous or life-threatening conditions suspected or identified by history, physical exam, and by work-up. No indications for hospitalization identified.      Carlisle Cater, PA-C 08/23/14 0124  Pamella Pert, MD 08/23/14 [phone]

## 2014-08-23 ENCOUNTER — Other Ambulatory Visit: Payer: Self-pay | Admitting: Family Medicine

## 2014-08-23 ENCOUNTER — Encounter: Payer: Self-pay | Admitting: Nurse Practitioner

## 2014-08-23 DIAGNOSIS — R911 Solitary pulmonary nodule: Secondary | ICD-10-CM | POA: Insufficient documentation

## 2014-08-23 DIAGNOSIS — I4891 Unspecified atrial fibrillation: Secondary | ICD-10-CM | POA: Diagnosis not present

## 2014-08-23 LAB — URINE MICROSCOPIC-ADD ON

## 2014-08-23 LAB — URINALYSIS, ROUTINE W REFLEX MICROSCOPIC
Glucose, UA: NEGATIVE mg/dL
KETONES UR: 15 mg/dL — AB
NITRITE: NEGATIVE
Protein, ur: 30 mg/dL — AB
Specific Gravity, Urine: 1.019 (ref 1.005–1.030)
Urobilinogen, UA: 1 mg/dL (ref 0.0–1.0)
pH: 5.5 (ref 5.0–8.0)

## 2014-08-23 LAB — MAGNESIUM: MAGNESIUM: 1.7 mg/dL (ref 1.5–2.5)

## 2014-08-23 LAB — TSH: TSH: 1.13 u[IU]/mL (ref 0.350–4.500)

## 2014-08-23 MED ORDER — METOPROLOL TARTRATE 25 MG PO TABS: ORAL_TABLET | ORAL | Status: DC

## 2014-08-23 MED ORDER — APIXABAN 5 MG PO TABS: 5.0000 mg | ORAL_TABLET | Freq: Two times a day (BID) | ORAL | Status: DC

## 2014-08-23 NOTE — Discharge Instructions (Signed)
Please read and follow all provided instructions.  Your diagnoses today include:  1. Atrial fibrillation with RVR   2. Palpitations     Tests performed today include:  An EKG of your heart - shows irregular heart rate   A chest x-ray - pulmonary nodule that needs to be checked by your doctor  Cardiac enzymes - a blood test for heart muscle damage, normal  Blood counts and electrolytes - slightly low potassium  Vital signs. See below for your results today.   Medications prescribed:   Apixaban - blood thinner medication to prevent stroke  Take any prescribed medications only as directed.   Metoprolol - Take 75mg  in the morning, 50mg  at night, and an extra 25mg  if your heart rate is fast.   Follow-up instructions: Please follow-up with your primary care provider as soon as you can for further evaluation of your symptoms.   Return instructions:  SEEK IMMEDIATE MEDICAL ATTENTION IF:  You have severe chest pain, especially if the pain is crushing or pressure-like and spreads to the arms, back, neck, or jaw, or if you have sweating, nausea (feeling sick to your stomach), or shortness of breath. THIS IS AN EMERGENCY. Don't wait to see if the pain will go away. Get medical help at once. Call 911 or [phone] (operator). DO NOT drive yourself to the hospital.   Your chest pain gets worse and does not go away with rest.   You have an attack of chest pain lasting longer than usual, despite rest and treatment with the medications your caregiver has prescribed.   You wake from sleep with chest pain or shortness of breath.  You feel dizzy or faint.  You have chest pain not typical of your usual pain for which you originally saw your caregiver.   You have any other emergent concerns regarding your health.   Your vital signs today were: BP 133/76 mmHg   Pulse 84   Temp(Src) 98.6 F (37 C) (Oral)   Resp 26   SpO2 95% If your blood pressure (BP) was elevated above 135/85 this visit, please  have this repeated by your doctor within one month. --------------

## 2014-08-23 NOTE — ED Notes (Signed)
Pt. Refused wheelchair 

## 2014-08-25 ENCOUNTER — Encounter: Payer: Self-pay | Admitting: Cardiology

## 2014-08-25 DIAGNOSIS — Z7901 Long term (current) use of anticoagulants: Secondary | ICD-10-CM

## 2014-08-25 DIAGNOSIS — I480 Paroxysmal atrial fibrillation: Secondary | ICD-10-CM

## 2014-08-25 NOTE — Progress Notes (Signed)
Patient ID: Laura Jacobs, female   DOB: Apr 25, 1934, 80 y.o.   MRN: [id number]   [name], [name]  Date of visit:  08/25/2014 DOB:  [birth year]    Age:  80 yrs. Medical record number:  [id number]     Account number:  [id number] Primary Care Provider: MARTIN,MARY-MARGARET ____________________________ CURRENT DIAGNOSES  1. Atherosclerotic heart disease of native coronary artery without angina pectoris  2. Paroxysmal atrial fibrillation  3. Long term (current) use of anticoagulants  4. Essential (primary) hypertension  5. Old myocardial infarction  6. Hyperlipidemia, unspecified  7. Presence of aortocoronary bypass graft ____________________________ ALLERGIES  No Known Allergies ____________________________ MEDICATIONS  1. nitroglycerin 0.4 mg Tablet, Sublingual, PRN  2. metoprolol tartrate 25 mg tablet, 3 qam  2 qpm  3. Zetia 10 mg tablet, 1 p.o. daily  4. atorvastatin 40 mg tablet, 1 p.o. daily  5. benazepril 20 mg tablet, 1 p.o. daily  6. amlodipine besylate (bulk) 100 % powder, 1 p.o. daily  7. Eliquis 5 mg tablet, BID ____________________________ CHIEF COMPLAINTS  Arrhythmia  New af ____________________________ HISTORY OF PRESENT ILLNESS  Patient seen for evaluation of atrial fibrillation. She has a previous history of an inferior infarction and had bypass grafting in 1998. I last saw her about 6 years ago. She has been doing well over the past 6 years but has continued to smoke. She has not had any angina and denies PND, orthopnea or edema. She recently presented to the emergency room with atrial fibrillation. She converted spontaneously and was sent home on Eliquis and a higher dose of the beta blocker. She has felt well since then without recurrent symptoms. When she was sent home she did not get the Eliquis prescription filled and also quit taking her other medications. She has had a mild amount of palpitations previous to this. ____________________________ PAST HISTORY   Past Medical Illnesses:  hypertension, hyperlipidemia, history of benign renal cyst, history of anemia-iron deficieny;  Cardiovascular Illnesses:  CAD, S/P MI-inferior 1998, atrial fibrillation;  Surgical Procedures:  CABG w LIMA to D1-D2-LAD, SVG to OM-circ, SVG to PD-PL 05/23/97 [facility];  Cardiology Procedures-Invasive:  cardiac cath (left) 1998;  Cardiology Procedures-Noninvasive:  treadmill cardiolite December 2009, echocardiogram December 2009, event monitor December 2009;  Cardiac Cath Results:  50% stenosis ostial Left main, 60% stenosis proximal LAD, 90% stenosis mid LAD, occluded mid CFX, 70% stenosis proximal RCA, 80% stenosis ostial PDA, occluded OM 2;  14,   ____________________________ CARDIO-PULMONARY TEST DATES EKG Date:  08/25/2014;   Cardiac Cath Date:  05/16/1997;  CABG: 05/23/1997;  Nuclear Study Date:  09/28/2008;  Echocardiography Date: 09/28/2008;   ____________________________ FAMILY HISTORY Brother -- Brother dead, Heart disease Brother -- Brother dead, Death of unknown cause Brother -- Brother dead, Death of unknown cause Brother -- Brother dead, Death of unknown cause Brother -- Brother dead, Death of unknown cause Brother -- Brother dead, Death of unknown cause Brother -- Brother dead, Death of unknown cause Father -- Father dead Mother -- Mother dead, Congestive heart failure Sister -- Sister alive with problem, Alzheimers disease Sister -- Sister dead, Alzheimers disease Sister -- Sister alive with problem, Medical history unknown ____________________________ SOCIAL HISTORY Alcohol Use:  no alcohol use;  Smoking:  used to smoke but quit, stopped x 1 week;  Diet:  regular diet;  Lifestyle:  married and 6 children;  Exercise:  no regular exercise;  Occupation:  retired and Equities trader;  Residence:  lives with husband and 2 sons still at home;  ____________________________ REVIEW OF SYSTEMS General:  denies recent weight change, fatique or change in exercise  tolerance.  Integumentary:easy bruisability Eyes: wears eye glasses/contact lenses, denies diplopia, glaucoma or visual field defects. Ears, Nose, Throat, Mouth:  denies any hearing loss, epistaxis, hoarseness or difficulty speaking. Respiratory: denies dyspnea, cough, wheezing or hemoptysis. Cardiovascular:  please review HPI Abdominal: abdominal painGenitourinary-Female: frequency, hesitancy Musculoskeletal:  denies arthritis, venous insufficiency, or muscle weakness Neurological:  denies headaches, stroke, or TIA  ____________________________ PHYSICAL EXAMINATION VITAL SIGNS  Blood Pressure:  132/74 Sitting, Right arm, regular cuff  , 130/76 Standing, Right arm and regular cuff   Pulse:  76/min. Weight:  144.00 lbs. Height:  65"BMI: 24  Constitutional:  pleasant white female, in no acute distress, thin Skin:  warm and dry to touch, no apparent skin lesions, or masses noted. Head:  normocephalic, normal hair pattern, no masses or tenderness Eyes:  EOMS Intact, PERRLA, C and S clear, Funduscopic exam not done. ENT:  ears, nose and throat unremarkable, upper dentures present Neck:  supple, no masses, thyromegaly, JVD. Carotid pulses are full and equal bilaterally without bruits. Chest:  increased A-P diameter, inspiratory wheezes, healed median sternotomy scar Cardiac:  regular rhythm, normal S1 and S2, No S3 or S4, no murmurs, gallops or rubs detected. Peripheral Pulses:  femoral pulses 2+, dorsalis pedis pulses diminished, posterior tibial pulses diminished Extremities & Back:  well healed saphenous vein donor site RLE, mild bilateral venous insufficiency changes present Neurological:  no gross motor or sensory deficits noted, affect appropriate, oriented x3. ____________________________ MOST RECENT LIPID PANEL 07/29/08  CHOL TOTL 110 mg/dl, LDL 51 NM, HDL 45 mg/dl, TRIGLYCER 74 mg/dl and CHOL/HDL 2.5 (Calc) ____________________________ IMPRESSIONS/PLAN 1. Paroxysmal atrial fibrillation  clinically resolved 2. Coronary artery disease with previous inferior infarction and bypass grafting without angina 3. COPD with previous tobacco abuse which she says that she has quit 4. Hypertension 5. Hyperlipidemia currently not treated  Recommendations:  Advised her to go back taking her other medications which included amlodipine, and aspirin all, Zetia and atorvastatin. I also asked her to start taking Eliquis 5 mg twice a day and to continue on her metoprolol 75 mg in the morning and 50 mg in the evening. I would like for her to have an echocardiogram. She would need to be on lifelong anticoagulation. Thank you for asking me to see her with you. ____________________________ TODAYS ORDERS  1. 2D, color flow, doppler: First Available  2. Return Visit: 1 month  3. 12 Lead EKG: Today                       ____________________________ Cardiology Physician:  Kerry Hough MD [facility]

## 2014-08-31 ENCOUNTER — Ambulatory Visit: Payer: Medicare Other

## 2014-08-31 ENCOUNTER — Other Ambulatory Visit: Payer: Medicare Other

## 2014-09-26 ENCOUNTER — Encounter: Payer: Self-pay | Admitting: Cardiology

## 2014-10-31 ENCOUNTER — Ambulatory Visit: Payer: Medicare Other | Admitting: Nurse Practitioner

## 2014-12-01 ENCOUNTER — Encounter: Payer: Self-pay | Admitting: Nurse Practitioner

## 2014-12-01 ENCOUNTER — Ambulatory Visit (INDEPENDENT_AMBULATORY_CARE_PROVIDER_SITE_OTHER): Payer: Medicare Other | Admitting: Nurse Practitioner

## 2014-12-01 VITALS — BP 132/82 | HR 69 | Temp 96.7°F | Ht 64.0 in | Wt 145.4 lb

## 2014-12-01 DIAGNOSIS — I10 Essential (primary) hypertension: Secondary | ICD-10-CM

## 2014-12-01 DIAGNOSIS — I480 Paroxysmal atrial fibrillation: Secondary | ICD-10-CM | POA: Diagnosis not present

## 2014-12-01 DIAGNOSIS — Z13820 Encounter for screening for osteoporosis: Secondary | ICD-10-CM

## 2014-12-01 DIAGNOSIS — E785 Hyperlipidemia, unspecified: Secondary | ICD-10-CM

## 2014-12-01 MED ORDER — EZETIMIBE 10 MG PO TABS: 10.0000 mg | ORAL_TABLET | Freq: Every day | ORAL | Status: DC

## 2014-12-01 MED ORDER — TRIAMTERENE-HCTZ 37.5-25 MG PO TABS: 1.0000 | ORAL_TABLET | Freq: Every day | ORAL | Status: DC

## 2014-12-01 MED ORDER — AMLODIPINE BESYLATE 5 MG PO TABS: 5.0000 mg | ORAL_TABLET | Freq: Every day | ORAL | Status: DC

## 2014-12-01 MED ORDER — ATORVASTATIN CALCIUM 40 MG PO TABS: 40.0000 mg | ORAL_TABLET | Freq: Every day | ORAL | Status: DC

## 2014-12-01 MED ORDER — METOPROLOL TARTRATE 25 MG PO TABS: ORAL_TABLET | ORAL | Status: DC

## 2014-12-01 MED ORDER — APIXABAN 5 MG PO TABS: 5.0000 mg | ORAL_TABLET | Freq: Two times a day (BID) | ORAL | Status: DC

## 2014-12-01 NOTE — Patient Instructions (Signed)
Exercise to Stay Healthy Exercise helps you become and stay healthy. EXERCISE IDEAS AND TIPS Choose exercises that:  You enjoy.  Fit into your day. You do not need to exercise really hard to be healthy. You can do exercises at a slow or medium level and stay healthy. You can:  Stretch before and after working out.  Try yoga, Pilates, or tai chi.  Lift weights.  Walk fast, swim, jog, run, climb stairs, bicycle, dance, or rollerskate.  Take aerobic classes. Exercises that burn about 150 calories:  Running 1  miles in 15 minutes.  Playing volleyball for 45 to 60 minutes.  Washing and waxing a car for 45 to 60 minutes.  Playing touch football for 45 minutes.  Walking 1  miles in 35 minutes.  Pushing a stroller 1  miles in 30 minutes.  Playing basketball for 30 minutes.  Raking leaves for 30 minutes.  Bicycling 5 miles in 30 minutes.  Walking 2 miles in 30 minutes.  Dancing for 30 minutes.  Shoveling snow for 15 minutes.  Swimming laps for 20 minutes.  Walking up stairs for 15 minutes.  Bicycling 4 miles in 15 minutes.  Gardening for 30 to 45 minutes.  Jumping rope for 15 minutes.  Washing windows or floors for 45 to 60 minutes. Document Released: 10/26/2010 Document Revised: 12/16/2011 Document Reviewed: 10/26/2010 Exit[employer] Patient Information 2015 [employer]. This information is not intended to replace advice given to you by your health care provider. Make sure you discuss any questions you have with your health care provider.  

## 2014-12-01 NOTE — Progress Notes (Signed)
Subjective:    Patient ID: Laura Jacobs, female    DOB: 1934/10/02, 81 y.o.   MRN: [id number]  Patient here today for follow up of chronic medical problems. No complaints today   Hypertension This is a chronic problem. The current episode started more than 1 year ago. The problem is unchanged. The problem is controlled. Pertinent negatives include no blurred vision, chest pain, headaches, neck pain, orthopnea, peripheral edema, shortness of breath or sweats. There are no associated agents to hypertension. Risk factors for coronary artery disease include dyslipidemia, post-menopausal state, sedentary lifestyle and smoking/tobacco exposure. Past treatments include calcium channel blockers, beta blockers and diuretics. The current treatment provides significant improvement. Compliance problems include diet and exercise.  Hypertensive end-organ damage includes kidney disease and CAD/MI (MI in 1998). Identifiable causes of hypertension include chronic renal disease.  Hyperlipidemia This is a chronic problem. The current episode started more than 1 year ago. The problem is uncontrolled. Recent lipid tests were reviewed and are high. Exacerbating diseases include chronic renal disease. She has no history of diabetes. Factors aggravating her hyperlipidemia include smoking and fatty foods. Pertinent negatives include no chest pain or shortness of breath. Current antihyperlipidemic treatment includes statins and ezetimibe. The current treatment provides moderate improvement of lipids. Compliance problems include adherence to diet and adherence to exercise.  Risk factors for coronary artery disease include hypertension, post-menopausal, a sedentary lifestyle and dyslipidemia.  GOUT Last flareup was several months ago- doing well right now- on no maintenance meds CAD Bypass 1998- no need to use nitrogylcerin at ome. Chronic kidney disease See nephrologist every 6 months-  Atrial fib Diagnosed in  November- started on eliquis- no recent palpitations- no bleeding     Review of Systems  Eyes: Negative for blurred vision.  Respiratory: Negative for shortness of breath.   Cardiovascular: Negative for chest pain and orthopnea.  Musculoskeletal: Negative for neck pain.  Neurological: Negative for headaches.  All other systems reviewed and are negative.      Objective:   Physical Exam  Constitutional: She is oriented to person, place, and time. She appears well-developed and well-nourished.  HENT:  Nose: Nose normal.  Mouth/Throat: Oropharynx is clear and moist.  Eyes: EOM are normal.  Neck: Trachea normal, normal range of motion and full passive range of motion without pain. Neck supple. No JVD present. Carotid bruit is not present. No thyromegaly present.  Cardiovascular: Normal rate, regular rhythm, normal heart sounds and intact distal pulses.  Exam reveals no gallop and no friction rub.   No murmur heard. Pulmonary/Chest: Effort normal and breath sounds normal.  Crackles in bases bilaterally   Abdominal: Soft. Bowel sounds are normal. She exhibits no distension and no mass. There is no tenderness.  Musculoskeletal: Normal range of motion.  Lymphadenopathy:    She has no cervical adenopathy.  Neurological: She is alert and oriented to person, place, and time. She has normal reflexes.  Skin: Skin is warm and dry.  Psychiatric: She has a normal mood and affect. Her behavior is normal. Judgment and thought content normal.    BP 132/82 mmHg  Pulse 69  Temp(Src) 96.7 F (35.9 C) (Oral)  Ht 5' 4"  (1.626 m)  Wt 145 lb 6.4 oz (65.953 kg)  BMI 24.95 kg/m2          Assessment & Plan:    1. Paroxysmal atrial fibrillation Avoid caffeine - apixaban (ELIQUIS) 5 MG TABS tablet; Take 1 tablet (5 mg total) by mouth 2 (two) times daily.  Dispense: 60 tablet; Refill: 0 - metoprolol tartrate (LOPRESSOR) 25 MG tablet; Take 63m (3 tablets) in the morning and 537m(2 tablets) at  night.  Dispense: 150 tablet; Refill: 0  2. Essential hypertension Do not add salt to diet - CMP14+EGFR - amLODipine (NORVASC) 5 MG tablet; Take 1 tablet (5 mg total) by mouth daily.  Dispense: 30 tablet; Refill: 5 - triamterene-hydrochlorothiazide (MAXZIDE-25) 37.5-25 MG per tablet; Take 1 tablet by mouth daily.  Dispense: 30 tablet; Refill: 5  3. Hyperlipidemia Low fat diet - NMR, lipoprofile - atorvastatin (LIPITOR) 40 MG tablet; Take 1 tablet (40 mg total) by mouth daily.  Dispense: 30 tablet; Refill: 5 - ezetimibe (ZETIA) 10 MG tablet; Take 1 tablet (10 mg total) by mouth daily.  Dispense: 30 tablet; Refill: 5  4. Screening for osteoporosis Weight bearing exercise - DG Bone Density; Future   Refuses ALL health maintenance Labs pending Health maintenance reviewed Diet and exercise encouraged Continue all meds Follow up  In 3 months   [id number]

## 2014-12-02 LAB — CMP14+EGFR
ALT: 15 IU/L (ref 0–32)
AST: 22 IU/L (ref 0–40)
Albumin/Globulin Ratio: 1.4 (ref 1.1–2.5)
Albumin: 4.3 g/dL (ref 3.5–4.8)
Alkaline Phosphatase: 114 IU/L (ref 39–117)
BUN/Creatinine Ratio: 16 (ref 11–26)
BUN: 25 mg/dL (ref 8–27)
Bilirubin Total: 0.9 mg/dL (ref 0.0–1.2)
CALCIUM: 9.5 mg/dL (ref 8.7–10.3)
CHLORIDE: 98 mmol/L (ref 97–108)
CO2: 25 mmol/L (ref 18–29)
CREATININE: 1.55 mg/dL — AB (ref 0.57–1.00)
GFR calc Af Amer: 37 mL/min/{1.73_m2} — ABNORMAL LOW (ref >59)
GFR calc non Af Amer: 32 mL/min/{1.73_m2} — ABNORMAL LOW (ref >59)
Globulin, Total: 3.1 g/dL (ref 1.5–4.5)
Glucose: 88 mg/dL (ref 65–99)
POTASSIUM: 4.6 mmol/L (ref 3.5–5.2)
SODIUM: 139 mmol/L (ref 134–144)
TOTAL PROTEIN: 7.4 g/dL (ref 6.0–8.5)

## 2014-12-02 LAB — NMR, LIPOPROFILE
CHOLESTEROL: 108 mg/dL (ref 100–199)
HDL Cholesterol by NMR: 34 mg/dL — ABNORMAL LOW (ref >39)
HDL Particle Number: 25.4 umol/L — ABNORMAL LOW (ref >=30.5)
LDL PARTICLE NUMBER: 542 nmol/L (ref <1000)
LDL SIZE: 20.4 nm (ref >20.5)
LDL-C: 54 mg/dL (ref 0–99)
LP-IR SCORE: 53 — AB (ref <=45)
Small LDL Particle Number: 295 nmol/L (ref <=527)
Triglycerides by NMR: 99 mg/dL (ref 0–149)

## 2014-12-26 DIAGNOSIS — M109 Gout, unspecified: Secondary | ICD-10-CM | POA: Diagnosis not present

## 2015-01-02 ENCOUNTER — Encounter (HOSPITAL_COMMUNITY): Payer: Self-pay

## 2015-01-02 ENCOUNTER — Emergency Department (HOSPITAL_COMMUNITY)
Admission: EM | Admit: 2015-01-02 | Discharge: 2015-01-02 | Disposition: A | Discharge status: Home / Self Care | Payer: Medicare Other | Attending: Emergency Medicine | Admitting: Emergency Medicine

## 2015-01-02 DIAGNOSIS — Z87891 Personal history of nicotine dependence: Secondary | ICD-10-CM | POA: Insufficient documentation

## 2015-01-02 DIAGNOSIS — I10 Essential (primary) hypertension: Secondary | ICD-10-CM | POA: Diagnosis not present

## 2015-01-02 DIAGNOSIS — Z87448 Personal history of other diseases of urinary system: Secondary | ICD-10-CM | POA: Insufficient documentation

## 2015-01-02 DIAGNOSIS — I480 Paroxysmal atrial fibrillation: Secondary | ICD-10-CM

## 2015-01-02 DIAGNOSIS — E785 Hyperlipidemia, unspecified: Secondary | ICD-10-CM | POA: Insufficient documentation

## 2015-01-02 DIAGNOSIS — R531 Weakness: Secondary | ICD-10-CM | POA: Diagnosis not present

## 2015-01-02 DIAGNOSIS — I4891 Unspecified atrial fibrillation: Secondary | ICD-10-CM | POA: Diagnosis not present

## 2015-01-02 DIAGNOSIS — R000 Tachycardia, unspecified: Secondary | ICD-10-CM | POA: Diagnosis present

## 2015-01-02 DIAGNOSIS — R197 Diarrhea, unspecified: Secondary | ICD-10-CM | POA: Diagnosis not present

## 2015-01-02 DIAGNOSIS — R404 Transient alteration of awareness: Secondary | ICD-10-CM | POA: Diagnosis not present

## 2015-01-02 DIAGNOSIS — I252 Old myocardial infarction: Secondary | ICD-10-CM | POA: Diagnosis not present

## 2015-01-02 DIAGNOSIS — Z79899 Other long term (current) drug therapy: Secondary | ICD-10-CM | POA: Insufficient documentation

## 2015-01-02 LAB — CBC WITH DIFFERENTIAL/PLATELET
BASOS PCT: 0 % (ref 0–1)
Basophils Absolute: 0 10*3/uL (ref 0.0–0.1)
Eosinophils Absolute: 0.2 10*3/uL (ref 0.0–0.7)
Eosinophils Relative: 2 % (ref 0–5)
HCT: 38.3 % (ref 36.0–46.0)
Hemoglobin: 12.9 g/dL (ref 12.0–15.0)
Lymphocytes Relative: 6 % — ABNORMAL LOW (ref 12–46)
Lymphs Abs: 0.8 10*3/uL (ref 0.7–4.0)
MCH: 30.6 pg (ref 26.0–34.0)
MCHC: 33.7 g/dL (ref 30.0–36.0)
MCV: 91 fL (ref 78.0–100.0)
MONO ABS: 1.6 10*3/uL — AB (ref 0.1–1.0)
MONOS PCT: 13 % — AB (ref 3–12)
Neutro Abs: 9.6 10*3/uL — ABNORMAL HIGH (ref 1.7–7.7)
Neutrophils Relative %: 79 % — ABNORMAL HIGH (ref 43–77)
Platelets: 186 10*3/uL (ref 150–400)
RBC: 4.21 MIL/uL (ref 3.87–5.11)
RDW: 13.3 % (ref 11.5–15.5)
WBC: 12.1 10*3/uL — ABNORMAL HIGH (ref 4.0–10.5)

## 2015-01-02 LAB — BASIC METABOLIC PANEL
ANION GAP: 8 (ref 5–15)
BUN: 32 mg/dL — ABNORMAL HIGH (ref 6–23)
CO2: 27 mmol/L (ref 19–32)
Calcium: 8.5 mg/dL (ref 8.4–10.5)
Chloride: 103 mmol/L (ref 96–112)
Creatinine, Ser: 1.4 mg/dL — ABNORMAL HIGH (ref 0.50–1.10)
GFR calc Af Amer: 41 mL/min — ABNORMAL LOW (ref >90)
GFR calc non Af Amer: 35 mL/min — ABNORMAL LOW (ref >90)
GLUCOSE: 108 mg/dL — AB (ref 70–99)
Potassium: 3.5 mmol/L (ref 3.5–5.1)
SODIUM: 138 mmol/L (ref 135–145)

## 2015-01-02 LAB — TROPONIN I: Troponin I: <0.03 ng/mL (ref <0.031)

## 2015-01-02 LAB — MAGNESIUM: MAGNESIUM: 1.5 mg/dL (ref 1.5–2.5)

## 2015-01-02 MED ORDER — DEXTROSE 5 % IV SOLN
5.0000 mg/h | INTRAVENOUS | Status: DC
  Administered 2015-01-02: 5 mg/h via INTRAVENOUS

## 2015-01-02 MED ORDER — SODIUM CHLORIDE 0.9 % IV BOLUS (SEPSIS)
1000.0000 mL | Freq: Once | INTRAVENOUS | Status: AC
  Administered 2015-01-02: 1000 mL via INTRAVENOUS

## 2015-01-02 MED ORDER — DILTIAZEM LOAD VIA INFUSION
15.0000 mg | Freq: Once | INTRAVENOUS | Status: AC
  Administered 2015-01-02: 15 mg via INTRAVENOUS
  Filled 2015-01-02: qty 15

## 2015-01-02 MED ORDER — METOPROLOL TARTRATE 25 MG PO TABS: ORAL_TABLET | ORAL | Status: DC

## 2015-01-02 MED ORDER — METOPROLOL TARTRATE 25 MG PO TABS
75.0000 mg | ORAL_TABLET | Freq: Once | ORAL | Status: AC
  Administered 2015-01-02: 75 mg via ORAL
  Filled 2015-01-02: qty 3

## 2015-01-02 NOTE — ED Notes (Signed)
HR is now stable in the 80s and Dr Regenia Skeeter stated that pt is in good shape to go home.

## 2015-01-02 NOTE — ED Notes (Signed)
Went into room to d/c patient and HR irregular and in the 120s, Dr Regenia Skeeter notified and advised to give pt metoprolol and watch patient to see if HR improves.

## 2015-01-02 NOTE — ED Notes (Signed)
Pt verbalized understanding of d/c instructions and has no further questions.

## 2015-01-02 NOTE — ED Notes (Signed)
Pt sts she has had one bout of diarrhea on Friday.  Pt sts bowel movements are normal since.    Pt denies any pain or pressure.  Pt sts she just felt her heart "flutter" earlier.  Pt sts she has not been eating or drinking as well as usual.

## 2015-01-02 NOTE — ED Notes (Signed)
Per EMS, called out for palpitations, Pt's HR 155-160, Pt is stable/asymptomatic, pt having bigeminal pvc's. Pt Had diarrhea recently. Pt feels like she has flutter in her chest but not in any distress and denies pain.

## 2015-01-02 NOTE — ED Notes (Signed)
Per Dr Regenia Skeeter, stop Diltiazem drip and see what the pt's HR does.

## 2015-01-02 NOTE — ED Notes (Signed)
Pt in no distress, pt is pain free and no longer feels flutter in chest.

## 2015-01-02 NOTE — ED Provider Notes (Signed)
CSN: [id number]     Arrival date & time 01/02/15  1910 History   First MD Initiated Contact with Patient 01/02/15 1926     Chief Complaint  Patient presents with  . Tachycardia     (Consider location/radiation/quality/duration/timing/severity/associated sxs/prior Treatment) HPI  81 year old female presents with palpitations for the last 3 days. These palpitations been constant. She denies any chest pain or shortness of breath. She had one episode of diarrhea but otherwise has not had any. This feels similar to when she was diagnosed with atrial fibrillation in November 2015. She denies any leg swelling or leg pain. Nothing else has been different over the weekend. Patient denies any syncope or near-syncope. Has been taking her medicines as instructed.  Past Medical History  Diagnosis Date  . Hyperlipidemia   . Hypertension   . Acute MI inferior posterior subsequent episode care   . Renal failure    Past Surgical History  Procedure Laterality Date  . Heart by pass     Family History  Problem Relation Age of Onset  . Heart disease Mother    History  Substance Use Topics  . Smoking status: Former Smoker -- 0.50 packs/day for 60 years    Types: Cigarettes  . Smokeless tobacco: Former Systems developer    Quit date: 05/11/2014  . Alcohol Use: No   OB History    No data available     Review of Systems  Constitutional: Negative for fever.  Respiratory: Negative for shortness of breath.   Cardiovascular: Positive for palpitations. Negative for chest pain.  Gastrointestinal: Positive for diarrhea.  Neurological: Negative for weakness.  All other systems reviewed and are negative.     Allergies  Review of patient's allergies indicates no known allergies.  Home Medications   Prior to Admission medications   Medication Sig Start Date End Date Taking? Authorizing Provider  amLODipine (NORVASC) 5 MG tablet Take 1 tablet (5 mg total) by mouth daily. 12/01/14   [name], FNP   apixaban (ELIQUIS) 5 MG TABS tablet Take 1 tablet (5 mg total) by mouth 2 (two) times daily. 12/01/14   [name], FNP  atorvastatin (LIPITOR) 40 MG tablet Take 1 tablet (40 mg total) by mouth daily. 12/01/14   [name], FNP  ezetimibe (ZETIA) 10 MG tablet Take 1 tablet (10 mg total) by mouth daily. 12/01/14   [name], FNP  FLUZONE HIGH-DOSE 0.5 ML SUSY Inject 1 Dose as directed once. 07/07/14   Historical Provider, MD  metoprolol tartrate (LOPRESSOR) 25 MG tablet Take 75mg  (3 tablets) in the morning and 50mg  (2 tablets) at night. 12/01/14   [name], FNP  NITROSTAT 0.4 MG SL tablet PLACE 1 TABLET UNDER THE TONGUE EVERY 5 MINUTES AS NEEDED FOR CHEST PAIN 08/23/14   [name], FNP  triamterene-hydrochlorothiazide (MAXZIDE-25) 37.5-25 MG per tablet Take 1 tablet by mouth daily. 12/01/14   [name], FNP   BP 128/76 mmHg  Pulse 159  Temp(Src) 99 F (37.2 C) (Oral)  Resp 24  Ht 5\' 5"  (1.651 m)  Wt 140 lb (63.504 kg)  BMI 23.30 kg/m2  SpO2 97% Physical Exam  Constitutional: She is oriented to person, place, and time. She appears well-developed and well-nourished.  HENT:  Head: Normocephalic and atraumatic.  Right Ear: External ear normal.  Left Ear: External ear normal.  Nose: Nose normal.  Eyes: Right eye exhibits no discharge. Left eye exhibits no discharge.  Cardiovascular: Regular rhythm and normal heart sounds.  Tachycardia present.   Pulmonary/Chest:  Effort normal and breath sounds normal.  Abdominal: Soft. There is no tenderness.  Neurological: She is alert and oriented to person, place, and time.  Skin: Skin is warm and dry.  Nursing note and vitals reviewed.   ED Course  Procedures (including critical care time) Labs Review Labs Reviewed  BASIC METABOLIC PANEL - Abnormal; Notable for the following:    Glucose, Bld 108 (*)    BUN 32 (*)    Creatinine, Ser 1.40 (*)    GFR calc non Af Amer 35 (*)    GFR calc  Af Amer 41 (*)    All other components within normal limits  CBC WITH DIFFERENTIAL/PLATELET - Abnormal; Notable for the following:    WBC 12.1 (*)    Neutrophils Relative % 79 (*)    Neutro Abs 9.6 (*)    Lymphocytes Relative 6 (*)    Monocytes Relative 13 (*)    Monocytes Absolute 1.6 (*)    All other components within normal limits  MAGNESIUM  TROPONIN I    Imaging Review No results found.   EKG Interpretation   Date/Time:  Monday January 02 2015 19:18:26 EDT Ventricular Rate:  158 PR Interval:    QRS Duration: 86 QT Interval:  300 QTC Calculation: 486 R Axis:   80 Text Interpretation:  Supraventricular tachycardia Ventricular premature  complex Repolarization abnormality, prob rate related similar to Nov 2015  Confirmed by Regenia Skeeter  MD, [place] [id number]) on 01/02/2015 7:28:12 PM       EKG Interpretation  Date/Time:  Monday January 02 2015 21:37:19 EDT Ventricular Rate:  90 PR Interval:    QRS Duration: 83 QT Interval:  326 QTC Calculation: 399 R Axis:   74 Text Interpretation:  Atrial flutter Nonspecific repol abnormality, diffuse leads Rate is slower since earlier in the day, nonspecific ST segments unchanged Confirmed by Hope ([id number]) on 01/02/2015 9:44:06 PM       MDM   Final diagnoses:  Atrial fibrillation with RVR    Patient has no symptoms from her tachycardia except feeling the palpitations. She's not having any anginal symptoms or near syncopal symptoms. Patient's heart rate back into a normal range after diltiazem bolus. She was on a drip at 5 mg per hour but after this stopped she maintained a normal heart rate. She's not in sinus rhythm but given she is rate controlled she is stable to be followed up as an outpatient. Discussed with Dr. Tommi Rumps who reviewed her chart and recommends increased metoprolol to 75 mg BID, as well as using 25 mg prn palpitations. She understands instructions as well as importance of follow up and return  precautions.    Sherwood Gambler, MD 01/03/15 [phone]

## 2015-01-09 ENCOUNTER — Telehealth: Payer: Self-pay | Admitting: Nurse Practitioner

## 2015-01-09 NOTE — Telephone Encounter (Signed)
Just need otc cold meds

## 2015-01-09 NOTE — Telephone Encounter (Signed)
Does pt ntbs or can we call something in? Please advise.

## 2015-01-10 NOTE — Telephone Encounter (Signed)
Instructed pt to try OTC meds, like Mucinex first, then if no improvement NTBS

## 2015-01-13 DIAGNOSIS — E785 Hyperlipidemia, unspecified: Secondary | ICD-10-CM | POA: Diagnosis not present

## 2015-01-13 DIAGNOSIS — I480 Paroxysmal atrial fibrillation: Secondary | ICD-10-CM | POA: Diagnosis not present

## 2015-01-13 DIAGNOSIS — R918 Other nonspecific abnormal finding of lung field: Secondary | ICD-10-CM | POA: Diagnosis not present

## 2015-01-13 DIAGNOSIS — I2510 Atherosclerotic heart disease of native coronary artery without angina pectoris: Secondary | ICD-10-CM | POA: Diagnosis not present

## 2015-01-23 ENCOUNTER — Other Ambulatory Visit: Payer: Self-pay | Admitting: Cardiology

## 2015-01-24 ENCOUNTER — Ambulatory Visit
Admission: RE | Admit: 2015-01-24 | Discharge: 2015-01-24 | Disposition: A | Discharge status: Home / Self Care | Payer: Medicare Other | Source: Ambulatory Visit | Attending: Cardiology | Admitting: Cardiology

## 2015-01-24 ENCOUNTER — Other Ambulatory Visit: Payer: Self-pay | Admitting: Cardiology

## 2015-01-24 DIAGNOSIS — R918 Other nonspecific abnormal finding of lung field: Secondary | ICD-10-CM | POA: Diagnosis not present

## 2015-01-24 DIAGNOSIS — J449 Chronic obstructive pulmonary disease, unspecified: Secondary | ICD-10-CM | POA: Diagnosis not present

## 2015-01-27 ENCOUNTER — Encounter: Payer: Self-pay | Admitting: Cardiology

## 2015-01-27 DIAGNOSIS — I2510 Atherosclerotic heart disease of native coronary artery without angina pectoris: Secondary | ICD-10-CM | POA: Diagnosis not present

## 2015-01-27 DIAGNOSIS — I252 Old myocardial infarction: Secondary | ICD-10-CM | POA: Insufficient documentation

## 2015-01-27 NOTE — Progress Notes (Unsigned)
Patient ID: Laura Jacobs, female   DOB: 07/04/1934, 81 y.o.   MRN: [id number]   [place], [place]  Date of visit:  01/27/2015 DOB:  12/11/1933    Age:  81 yrs. Medical record number:  [id number]     Account number:  [id number] Primary Care Provider: Redge Gainer ____________________________ CURRENT DIAGNOSES  1. Atherosclerotic heart disease of native coronary artery without angina pectoris  2. Paroxysmal atrial fibrillation  3. Abnormal chest Xray  4. Long term (current) use of anticoagulants  5. Essential (primary) hypertension  6. Old myocardial infarction  7. Hyperlipidemia, unspecified  8. Chronic kidney disease, stage 3 (moderate)  9. Presence of aortocoronary bypass graft ____________________________ ALLERGIES  No Known Allergies ____________________________ MEDICATIONS  1. nitroglycerin 0.4 mg Tablet, Sublingual, PRN  2. Zetia 10 mg tablet, 1 p.o. daily  3. atorvastatin 40 mg tablet, 1 p.o. daily  4. benazepril 20 mg tablet, 1 p.o. daily  5. Eliquis 5 mg tablet, BID  6. amlodipine 5 mg tablet, 1 p.o. daily  7. metoprolol tartrate 25 mg tablet, 3 p.o. b.i.d. ____________________________ CHIEF COMPLAINTS  Followup of Long term (current) use of anticoagulants  Followup of Paroxysmal atrial fibrillation ____________________________ HISTORY OF PRESENT ILLNESS Patient seen for cardiac followup. She has been feeling better since she was here her with less dyspnea. CT scan did not show any significant cavitary infiltrate and the previous infiltrate had resolved. She did have some small nodules noted that CT scanning was noted for prolonged followup. At the present time she has no angina. She has no bleeding complications from Eliquis and feels that her activity has been back to normal. She hasn't been aware that her heart was out of rhythm. She denies PND, orthopnea or edema. ____________________________ PAST HISTORY  Past Medical Illnesses:  hypertension, hyperlipidemia,  history of benign renal cyst, history of anemia-iron deficieny;  Cardiovascular Illnesses:  CAD, S/P MI-inferior 1998, atrial fibrillation;  Surgical Procedures:  CABG w LIMA to D1-D2-LAD, SVG to OM-circ, SVG to PD-PL 05/23/97 [facility];  NYHA Classification:  I;  Canadian Angina Classification:  Class 0: Asymptomatic;  Cardiology Procedures-Invasive:  cardiac cath (left) 1998;  Cardiology Procedures-Noninvasive:  treadmill cardiolite December 2009, event monitor December 2009, echocardiogram November 2015;  Cardiac Cath Results:  50% stenosis ostial Left main, 60% stenosis proximal LAD, 90% stenosis mid LAD, occluded mid CFX, 70% stenosis proximal RCA, 80% stenosis ostial PDA, occluded OM 2;  LVEF of 50% documented via echocardiogram on 09/05/2014,   CHA2DS2-VASC Score:  5 ____________________________ CARDIO-PULMONARY TEST DATES EKG Date:  01/27/2015;   Cardiac Cath Date:  05/16/1997;  CABG: 05/23/1997;  Nuclear Study Date:  09/28/2008;  Echocardiography Date: 09/05/2014;  Chest Xray Date: 01/24/2015;  CT Scan Date:  01/24/2015   ____________________________ FAMILY HISTORY Brother -- Brother dead, Heart disease Brother -- Brother dead, Death of unknown cause Brother -- Brother dead, Death of unknown cause Brother -- Brother dead, Death of unknown cause Brother -- Brother dead, Death of unknown cause Brother -- Brother dead, Death of unknown cause Brother -- Brother dead, Death of unknown cause Father -- Father dead, Alcoholism Mother -- Mother dead, Congestive heart failure Sister -- Sister alive with problem, Alzheimers disease Sister -- Sister dead, Alzheimers disease Sister -- Sister alive with problem, Medical history unknown ____________________________ SOCIAL HISTORY Alcohol Use:  no alcohol use;  Smoking:  used to smoke but quit 2015, greater than 50 pack year history;  Diet:  regular diet;  Lifestyle:  married and 6 children;  Exercise:  no regular exercise;  Occupation:  retired and Manufacturing systems engineer;  Residence:  lives with husband and 2 sons still at home;   ____________________________ REVIEW OF SYSTEMS General:  denies recent weight change, fatique or change in exercise tolerance.  Integumentary:easy bruisability Eyes: wears eye glasses/contact lenses Respiratory: denies dyspnea, cough, wheezing or hemoptysis. Cardiovascular:  please review HPI Abdominal: denies dyspepsia, GI bleeding, constipation, or diarrheaGenitourinary-Female: frequency, hesitancyNeurological:  denies headaches, stroke, or TIA  ____________________________ PHYSICAL EXAMINATION VITAL SIGNS  Blood Pressure:  114/60 Sitting, Left arm, regular cuff  , 110/54 Standing, Left arm and regular cuff   Pulse:  64/min. Weight:  142.00 lbs. Height:  65"BMI: 23  Constitutional:  pleasant white female, in no acute distress, thin Skin:  warm and dry to touch, no apparent skin lesions, or masses noted. Head:  normocephalic, normal hair pattern, no masses or tenderness Neck:  supple, no masses, thyromegaly, JVD. Carotid pulses are full and equal bilaterally without bruits. Chest:  healed median sternotomy scar, increased A-P diameter, fine rales base(s) Cardiac:  regular rhythm, normal S1 and S2, No S3 or S4, no murmurs, gallops or rubs detected. Abdomen:  abdomen soft,non-tender, no masses, no hepatospenomegaly, or aneurysm noted Peripheral Pulses:  femoral pulses 2+, dorsalis pedis pulses diminished, posterior tibial pulses diminished Extremities & Back:  well healed saphenous vein donor site RLE, mild bilateral venous insufficiency changes present Neurological:  no gross motor or sensory deficits noted, affect appropriate, oriented x3. ____________________________ MOST RECENT LIPID PANEL 12/01/14  CHOL TOTL 108 mg/dl, LDL 54 NM, HDL 34 mg/dl and TRIGLYCER 99 mg/dl ____________________________ IMPRESSIONS/PLAN  1. Paroxysmal atrial fibrillation currently in sinus rhythm on EKG today with nonspecific ST changes 2.  Coronary artery disease with previous bypass grafting no angina 3. Long-term use of anticoagulation without complications 4. Stage III chronic kidney disease stable  Recommendations:  EKG shows sinus rhythm with nonspecific ST changes. Recommended followup for a regular visit in June. Continue anticoagulation. Continue metoprolol at higher dose. If she has recurrent atrial fibrillation may low-dose amiodarone but the abnormal chest x-ray concerned somewhat about pulmonary toxicity. ____________________________ TODAYS ORDERS  1. 12 Lead EKG: Today  2. Keep regular appointment                       ____________________________ Cardiology Physician:  Kerry Hough MD [facility]

## 2015-02-08 ENCOUNTER — Other Ambulatory Visit: Payer: Self-pay

## 2015-02-08 DIAGNOSIS — I480 Paroxysmal atrial fibrillation: Secondary | ICD-10-CM

## 2015-02-08 MED ORDER — METOPROLOL TARTRATE 25 MG PO TABS: ORAL_TABLET | ORAL | Status: DC

## 2015-02-23 ENCOUNTER — Ambulatory Visit (INDEPENDENT_AMBULATORY_CARE_PROVIDER_SITE_OTHER): Payer: Medicare Other

## 2015-02-23 ENCOUNTER — Encounter: Payer: Self-pay | Admitting: Nurse Practitioner

## 2015-02-23 ENCOUNTER — Ambulatory Visit (INDEPENDENT_AMBULATORY_CARE_PROVIDER_SITE_OTHER): Payer: Medicare Other | Admitting: Nurse Practitioner

## 2015-02-23 VITALS — BP 130/67 | HR 55 | Temp 96.8°F | Ht 65.0 in | Wt 143.0 lb

## 2015-02-23 DIAGNOSIS — Z780 Asymptomatic menopausal state: Secondary | ICD-10-CM

## 2015-02-23 DIAGNOSIS — Z23 Encounter for immunization: Secondary | ICD-10-CM

## 2015-02-23 DIAGNOSIS — I119 Hypertensive heart disease without heart failure: Secondary | ICD-10-CM

## 2015-02-23 DIAGNOSIS — I2510 Atherosclerotic heart disease of native coronary artery without angina pectoris: Secondary | ICD-10-CM

## 2015-02-23 DIAGNOSIS — N184 Chronic kidney disease, stage 4 (severe): Secondary | ICD-10-CM | POA: Diagnosis not present

## 2015-02-23 DIAGNOSIS — M1A9XX0 Chronic gout, unspecified, without tophus (tophi): Secondary | ICD-10-CM

## 2015-02-23 DIAGNOSIS — I480 Paroxysmal atrial fibrillation: Secondary | ICD-10-CM | POA: Diagnosis not present

## 2015-02-23 DIAGNOSIS — Z7901 Long term (current) use of anticoagulants: Secondary | ICD-10-CM

## 2015-02-23 DIAGNOSIS — E785 Hyperlipidemia, unspecified: Secondary | ICD-10-CM | POA: Diagnosis not present

## 2015-02-23 DIAGNOSIS — Z13820 Encounter for screening for osteoporosis: Secondary | ICD-10-CM

## 2015-02-23 DIAGNOSIS — Z7689 Persons encountering health services in other specified circumstances: Secondary | ICD-10-CM | POA: Diagnosis not present

## 2015-02-23 MED ORDER — APIXABAN 5 MG PO TABS: 5.0000 mg | ORAL_TABLET | Freq: Two times a day (BID) | ORAL | Status: DC

## 2015-02-23 NOTE — Progress Notes (Signed)
Subjective:    Patient ID: Laura Jacobs, female    DOB: 03-12-1934, 81 y.o.   MRN: [id number]  Patient here today for follow up of chronic medical problems. No complaints today   Hypertension This is a chronic problem. The current episode started more than 1 year ago. The problem is unchanged. The problem is controlled. Pertinent negatives include no blurred vision, chest pain, headaches, neck pain, orthopnea, peripheral edema, shortness of breath or sweats. There are no associated agents to hypertension. Risk factors for coronary artery disease include dyslipidemia, post-menopausal state, sedentary lifestyle and smoking/tobacco exposure. Past treatments include calcium channel blockers, beta blockers and diuretics. The current treatment provides significant improvement. Compliance problems include diet and exercise.  Hypertensive end-organ damage includes kidney disease and CAD/MI (MI in 1998). Identifiable causes of hypertension include chronic renal disease.  Hyperlipidemia This is a chronic problem. The current episode started more than 1 year ago. The problem is uncontrolled. Recent lipid tests were reviewed and are high. Exacerbating diseases include chronic renal disease. She has no history of diabetes. Factors aggravating her hyperlipidemia include smoking and fatty foods. Pertinent negatives include no chest pain or shortness of breath. Current antihyperlipidemic treatment includes statins and ezetimibe. The current treatment provides moderate improvement of lipids. Compliance problems include adherence to diet and adherence to exercise.  Risk factors for coronary artery disease include hypertension, post-menopausal, a sedentary lifestyle and dyslipidemia.  GOUT Last flareup was several months ago- doing well right now- on no maintenance meds CAD Bypass 1998- no need to use nitrogylcerin at ome. Chronic kidney disease See nephrologist every 6 months-  Atrial fib/long term  anticoagulant Diagnosed in November- started on eliquis- no recent palpitations- no bleeding     Review of Systems  Eyes: Negative for blurred vision.  Respiratory: Negative for shortness of breath.   Cardiovascular: Negative for chest pain and orthopnea.  Genitourinary:       Denies urinary incontinence  Musculoskeletal: Negative for neck pain.  Neurological: Negative for headaches.  All other systems reviewed and are negative.      Objective:   Physical Exam  Constitutional: She is oriented to person, place, and time. She appears well-developed and well-nourished.  HENT:  Nose: Nose normal.  Mouth/Throat: Oropharynx is clear and moist.  Eyes: EOM are normal.  Neck: Trachea normal, normal range of motion and full passive range of motion without pain. Neck supple. No JVD present. Carotid bruit is not present. No thyromegaly present.  Cardiovascular: Normal rate, regular rhythm, normal heart sounds and intact distal pulses.  Exam reveals no gallop and no friction rub.   No murmur heard. Pulmonary/Chest: Effort normal and breath sounds normal.  Crackles in bases bilaterally   Abdominal: Soft. Bowel sounds are normal. She exhibits no distension and no mass. There is no tenderness.  Musculoskeletal: Normal range of motion.  Compression hose in place  Lymphadenopathy:    She has no cervical adenopathy.  Neurological: She is alert and oriented to person, place, and time. She has normal reflexes.  Skin: Skin is warm and dry.  Psychiatric: She has a normal mood and affect. Her behavior is normal. Judgment and thought content normal.    BP 130/67 mmHg  Pulse 55  Temp(Src) 96.8 F (36 C) (Oral)  Ht _0  (1.651 m)  Wt 143 lb (64.864 kg)  BMI 23.80 kg/m2           Assessment & Plan:    1. Paroxysmal atrial fibrillation Avoid caffeine - apixaban (  ELIQUIS) 5 MG TABS tablet; Take 1 tablet (5 mg total) by mouth 2 (two) times daily.  Dispense: 60 tablet; Refill: 0  2.  Hypertensive heart disease  Do not add salt to diet - CMP14+EGFR  3. Coronary artery disease involving native coronary artery of native heart without angina pectoris Keep follow up appointment with cardiologist  4. Chronic kidney disease (CKD), stage IV (severe)  5. Hyperlipidemia Low fta diet - NMR, lipoprofile  6. Chronic gout without tophus, unspecified cause, unspecified site  7. Long-term (current) use of anticoagulants Watch for any bleeding and report immediately    Labs pending Health maintenance reviewed Diet and exercise encouraged Continue all meds Follow up  In 3 months   [place], FNP

## 2015-02-23 NOTE — Addendum Note (Signed)
Addended by: Rolena Infante on: 02/23/2015 05:04 PM   Modules accepted: Orders

## 2015-02-23 NOTE — Patient Instructions (Signed)

## 2015-02-24 LAB — NMR, LIPOPROFILE
Cholesterol: 104 mg/dL (ref 100–199)
HDL CHOLESTEROL BY NMR: 43 mg/dL (ref >39)
HDL Particle Number: 31.2 umol/L (ref >=30.5)
LDL PARTICLE NUMBER: 529 nmol/L (ref <1000)
LDL SIZE: 20.7 nm (ref >20.5)
LDL-C: 45 mg/dL (ref 0–99)
LP-IR Score: 40 (ref <=45)
Small LDL Particle Number: 346 nmol/L (ref <=527)
TRIGLYCERIDES BY NMR: 82 mg/dL (ref 0–149)

## 2015-02-24 LAB — CMP14+EGFR
ALK PHOS: 83 IU/L (ref 39–117)
ALT: 15 IU/L (ref 0–32)
AST: 22 IU/L (ref 0–40)
Albumin/Globulin Ratio: 1.6 (ref 1.1–2.5)
Albumin: 4.5 g/dL (ref 3.5–4.8)
BILIRUBIN TOTAL: 1 mg/dL (ref 0.0–1.2)
BUN / CREAT RATIO: 15 (ref 11–26)
BUN: 22 mg/dL (ref 8–27)
CHLORIDE: 101 mmol/L (ref 97–108)
CO2: 25 mmol/L (ref 18–29)
CREATININE: 1.48 mg/dL — AB (ref 0.57–1.00)
Calcium: 9.4 mg/dL (ref 8.7–10.3)
GFR calc non Af Amer: 34 mL/min/{1.73_m2} — ABNORMAL LOW (ref >59)
GFR, EST AFRICAN AMERICAN: 39 mL/min/{1.73_m2} — AB (ref >59)
Globulin, Total: 2.8 g/dL (ref 1.5–4.5)
Glucose: 84 mg/dL (ref 65–99)
Potassium: 3.8 mmol/L (ref 3.5–5.2)
SODIUM: 141 mmol/L (ref 134–144)
TOTAL PROTEIN: 7.3 g/dL (ref 6.0–8.5)

## 2015-02-27 ENCOUNTER — Encounter: Payer: Self-pay | Admitting: Pharmacist

## 2015-02-27 ENCOUNTER — Ambulatory Visit (INDEPENDENT_AMBULATORY_CARE_PROVIDER_SITE_OTHER): Payer: Medicare Other | Admitting: Pharmacist

## 2015-02-27 VITALS — BP 124/68 | HR 60 | Ht 63.0 in | Wt 141.0 lb

## 2015-02-27 DIAGNOSIS — R7989 Other specified abnormal findings of blood chemistry: Secondary | ICD-10-CM | POA: Insufficient documentation

## 2015-02-27 DIAGNOSIS — Z0000 Encounter for general adult medical examination without abnormal findings: Secondary | ICD-10-CM

## 2015-02-27 DIAGNOSIS — M810 Age-related osteoporosis without current pathological fracture: Secondary | ICD-10-CM | POA: Insufficient documentation

## 2015-02-27 MED ORDER — CALCIUM CARBONATE-VITAMIN D 500-200 MG-UNIT PO TABS: 1.0000 | ORAL_TABLET | Freq: Two times a day (BID) | ORAL | Status: AC

## 2015-02-27 NOTE — Patient Instructions (Addendum)
Laura Jacobs , Thank you for taking time to come for your Medicare Wellness Visit. I appreciate your ongoing commitment to your health goals. Please review the following plan we discussed and let me know if I can assist you in the future.   These are the goals we discussed: Goals    None      This is a list of the screening recommended for you and due dates:  Health Maintenance  Topic Date Due  . Shingles Vaccine  06/01/2015*  . Tetanus Vaccine  06/01/2015*  . Mammogram  12/02/2015*  . Colon Cancer Screening  12/02/2015*  . Flu Shot  05/08/2015  . Pneumonia vaccines (2 of 2 - PPSV23) 02/23/2016  . DEXA scan (bone density measurement)  Completed  *Topic was postponed. The date shown is not the original due date.                 Exercise for Strong Bones  Exercise is important to build and maintain strong bones / bone density.  There are 2 types of exercises that are important to building and maintaining strong bones:  Weight- bearing and muscle-stregthening.  Weight-bearing Exercises  These exercises include activities that make you move against gravity while staying upright. Weight-bearing exercises can be high-impact or low-impact.  High-impact weight-bearing exercises help build bones and keep them strong. If you have broken a bone due to osteoporosis or are at risk of breaking a bone, you may need to avoid high-impact exercises. If you're not sure, you should check with your healthcare provider.  Examples of high-impact weight-bearing exercises are: Dancing  Doing high-impact aerobics  Hiking  Jogging/running  Jumping Rope  Stair climbing  Tennis  Low-impact weight-bearing exercises can also help keep bones strong and are a safe alternative if you cannot do high-impact exercises.   Examples of low-impact weight-bearing exercises are: Using elliptical training machines  Doing low-impact aerobics  Using stair-step machines  Fast walking on a treadmill or  outside   Muscle-Strengthening Exercises These exercises include activities where you move your body, a weight or some other resistance against gravity. They are also known as resistance exercises and include: Lifting weights  Using elastic exercise bands  Using weight machines  Lifting your own body weight  Functional movements, such as standing and rising up on your toes  Yoga and Pilates can also improve strength, balance and flexibility. However, certain positions may not be safe for people with osteoporosis or those at increased risk of broken bones. For example, exercises that have you bend forward may increase the chance of breaking a bone in the spine.   Non-Impact Exercises There are other types of exercises that can help prevent falls.  Non-impact exercises can help you to improve balance, posture and how well you move in everyday activities. Some of these exercises include: Balance exercises that strengthen your legs and test your balance, such as Tai Chi, can decrease your risk of falls.  Posture exercises that improve your posture and reduce rounded or "sloping" shoulders can help you decrease the chance of breaking a bone, especially in the spine.  Functional exercises that improve how well you move can help you with everyday activities and decrease your chance of falling and breaking a bone. For example, if you have trouble getting up from a chair or climbing stairs, you should do these activities as exercises.   **A physical therapist can teach you balance, posture and functional exercises. He/she can also help you learn which  exercises are safe and appropriate for you.  [facility] has a physical therapy office in [place] in front of our office and referrals can be made for assessments and treatment as needed and strength and balance training.  If you would like to have an assessment with [facility] and our physical therapy team please let a nurse or provider know.   Fall  Prevention and Home Safety Falls cause injuries and can affect all age groups. It is possible to use preventive measures to significantly decrease the likelihood of falls. There are many simple measures which can make your home safer and prevent falls. OUTDOORS  Repair cracks and edges of walkways and driveways.  Remove high doorway thresholds.  Trim shrubbery on the main path into your home.  Have good outside lighting.  Clear walkways of tools, rocks, debris, and clutter.  Check that handrails are not broken and are securely fastened. Both sides of steps should have handrails.  Have leaves, snow, and ice cleared regularly.  Use sand or salt on walkways during winter months.  In the garage, clean up grease or oil spills. BATHROOM  Install night lights.  Install grab bars by the toilet and in the tub and shower.  Use non-skid mats or decals in the tub or shower.  Place a plastic non-slip stool in the shower to sit on, if needed.  Keep floors dry and clean up all water on the floor immediately.  Remove soap buildup in the tub or shower on a regular basis.  Secure bath mats with non-slip, double-sided rug tape.  Remove throw rugs and tripping hazards from the floors. BEDROOMS  Install night lights.  Make sure a bedside light is easy to reach.  Do not use oversized bedding.  Keep a telephone by your bedside.  Have a firm chair with side arms to use for getting dressed.  Remove throw rugs and tripping hazards from the floor. KITCHEN  Keep handles on pots and pans turned toward the center of the stove. Use back burners when possible.  Clean up spills quickly and allow time for drying.  Avoid walking on wet floors.  Avoid hot utensils and knives.  Position shelves so they are not too high or low.  Place commonly used objects within easy reach.  If necessary, use a sturdy step stool with a grab bar when reaching.  Keep electrical cables out of the  way.  Do not use floor polish or wax that makes floors slippery. If you must use wax, use non-skid floor wax.  Remove throw rugs and tripping hazards from the floor. STAIRWAYS  Never leave objects on stairs.  Place handrails on both sides of stairways and use them. Fix any loose handrails. Make sure handrails on both sides of the stairways are as long as the stairs.  Check carpeting to make sure it is firmly attached along stairs. Make repairs to worn or loose carpet promptly.  Avoid placing throw rugs at the top or bottom of stairways, or properly secure the rug with carpet tape to prevent slippage. Get rid of throw rugs, if possible.  Have an electrician put in a light switch at the top and bottom of the stairs. OTHER FALL PREVENTION TIPS  Wear low-heel or rubber-soled shoes that are supportive and fit well. Wear closed toe shoes.  When using a stepladder, make sure it is fully opened and both spreaders are firmly locked. Do not climb a closed stepladder.  Add color or contrast paint or tape  to grab bars and handrails in your home. Place contrasting color strips on first and last steps.  Learn and use mobility aids as needed. Install an electrical emergency response system.  Turn on lights to avoid dark areas. Replace light bulbs that burn out immediately. Get light switches that glow.  Arrange furniture to create clear pathways. Keep furniture in the same place.  Firmly attach carpet with non-skid or double-sided tape.  Eliminate uneven floor surfaces.  Select a carpet pattern that does not visually hide the edge of steps.  Be aware of all pets. OTHER HOME SAFETY TIPS  Set the water temperature for 120 F (48.8 C).  Keep emergency numbers on or near the telephone.  Keep smoke detectors on every level of the home and near sleeping areas. Document Released: 09/13/2002 Document Revised: 03/24/2012 Document Reviewed: 12/13/2011 [facility] Patient Information 2015  [place], [place]. This information is not intended to replace advice given to you by your health care provider. Make sure you discuss any questions you have with your health care provider.

## 2015-02-27 NOTE — Progress Notes (Signed)
Patient ID: GENIVIEVE CALLIER, female   DOB: 12/27/1933, 81 y.o.   MRN: [id number]    Subjective:   DALYLA GOANS is a 81 y.o. female who presents for an Initial Medicare Annual Wellness Visit and to review DEXA   Current Medications (verified) Outpatient Encounter Prescriptions as of 02/27/2015  Medication Sig  . amLODipine (NORVASC) 5 MG tablet Take 1 tablet (5 mg total) by mouth daily.  Marland Kitchen apixaban (ELIQUIS) 5 MG TABS tablet Take 1 tablet (5 mg total) by mouth 2 (two) times daily.  Marland Kitchen atorvastatin (LIPITOR) 40 MG tablet Take 1 tablet (40 mg total) by mouth daily.  Marland Kitchen ezetimibe (ZETIA) 10 MG tablet Take 1 tablet (10 mg total) by mouth daily.  . metoprolol tartrate (LOPRESSOR) 25 MG tablet Take 75mg  (3 tablets) in the morning and 75 mg (3 tablets) at night.  Marland Kitchen NITROSTAT 0.4 MG SL tablet PLACE 1 TABLET UNDER THE TONGUE EVERY 5 MINUTES AS NEEDED FOR CHEST PAIN  . triamterene-hydrochlorothiazide (MAXZIDE-25) 37.5-25 MG per tablet Take 1 tablet by mouth daily.   No facility-administered encounter medications on file as of 02/27/2015.    Allergies (verified) Review of patient's allergies indicates no known allergies.   History: Past Medical History  Diagnosis Date  . Hyperlipidemia   . Hypertension   . Gout   . Atrial fibrillation, unspecified   . Acute MI   . CAD (coronary artery disease)    Past Surgical History  Procedure Laterality Date  . Heart by pass     Family History  Problem Relation Age of Onset  . Heart disease Mother   . Alcohol abuse Father   . Alzheimer's disease Sister   . Cancer Sister    Social History   Occupational History  . Not on file.   Social History Main Topics  . Smoking status: Former Smoker -- 0.50 packs/day for 60 years    Types: Cigarettes    Quit date: 08/07/2014  . Smokeless tobacco: Former Systems developer    Quit date: 05/11/2014  . Alcohol Use: No  . Drug Use: No  . Sexual Activity: Yes    Do you feel safe at home?  Yes  Dietary  issues and exercise activities:    Current Dietary habits:  No following any specific dietary recommendations   Objective:    There were no vitals filed for this visit. There is no weight on file to calculate BMI.  Activities of Daily Living In your present state of health, do you have any difficulty performing the following activities: 06/03/2014  Vision? N  Difficulty concentrating or making decisions? N  Walking or climbing stairs? N  Dressing or bathing? N  Doing errands, shopping? N    Are there smokers in your home (other than you)? Yes - son   Current Height: Height: 5\' 3"  (160 cm)      Max Lifetime Height:  5\' 5"  Current Weight: Weight: 141 lb (63.957 kg)       Ethnicity:Caucasian   Calcium Assessment Calcium Intake  # of servings/day  Calcium mg  Milk (8 oz) 0  x  300  = 0  Yogurt (4 oz) 0 x  200 = 0  Cheese (1 oz) 1 x  200 = 200mg   Other Calcium sources   250mg   Ca supplement 0 = 0   Estimated calcium intake per day 450mg     DEXA Results Date of Test T-Score for AP Spine L1-L4 T-Score for Total Left Hip T-Score for Total  Right Hip  02/23/2015 -1.0 -2.4 -2.7                    Depression Screen PHQ 2/9 Scores 02/23/2015 12/01/2014 06/03/2014 03/03/2014  PHQ - 2 Score 0 0 0 0    Fall Risk Fall Risk  02/23/2015 12/01/2014 06/03/2014 03/03/2014 12/03/2013  Falls in the past year? No No No No No    Cognitive Function: No flowsheet data found.  Immunizations and Health Maintenance Immunization History  Administered Date(s) Administered  . Influenza, High Dose Seasonal PF 07/07/2014  . Pneumococcal Conjugate-13 02/23/2015   There are no preventive care reminders to display for this patient.  Patient Care Team: [name], FNP as PCP - General (Nurse Practitioner)  Indicate any recent Medical Services you may have received from other than [facility] providers in the past year (date may be approximate).    Assessment:    Annual Wellness Visit    Osteoporosis Low serum vitamin D   Screening Tests Health Maintenance  Topic Date Due  . ZOSTAVAX  06/01/2015 (Originally 03/31/1996)  . TETANUS/TDAP  06/01/2015 (Originally 04/01/1955)  . MAMMOGRAM  12/02/2015 (Originally 03/31/1954)  . COLONOSCOPY  12/02/2015 (Originally 03/31/1986)  . INFLUENZA VACCINE  05/08/2015  . PNA vac Low Risk Adult (2 of 2 - PPSV23) 02/23/2016  . DEXA SCAN  Completed        Plan:   During the course of the visit Saiya was educated and counseled about the following appropriate screening and preventive services:   Vaccines to include Pneumoccal, Influenza, Hepatitis B, Td, Zostavax - Received Prevnar 13 4 days ago.  Plan to get Tdap and Zostavax in 2 months  Colorectal cancer screening- UTD  Cardiovascular disease screening - UTD.  BP and LDL at goal  Diabetes screening - FBG was 84 last week  Bone Denisty / Osteoporosis Screening - done 02/22/2105  Add vitamin D to labs drawn 02/23/2015  Fall prevention discussed  Start calcium 500mg  bid  Due to elevated serum creatinine and history of atrial fibrillation will not start bisphosphonate - recheck DEXA in 1-2 years  Mammogram - UTD  Glaucoma screening / Diabetic Eye Exam - Due now patient has appt  Nutrition counseling - discussed how to increase calcium intake from diet.    Advanced Directives - Information given  Increase physical activity - especially weight bearing exercise   Goals    None       Patient Instructions (the written plan) were given to the patient.   Cherre Robins, [facility]   02/27/2015

## 2015-03-01 LAB — VITAMIN D 25 HYDROXY (VIT D DEFICIENCY, FRACTURES): Vit D, 25-Hydroxy: 44.5 ng/mL (ref 30.0–100.0)

## 2015-03-01 LAB — SPECIMEN STATUS REPORT

## 2015-03-13 ENCOUNTER — Other Ambulatory Visit: Payer: Self-pay | Admitting: *Deleted

## 2015-03-13 DIAGNOSIS — E785 Hyperlipidemia, unspecified: Secondary | ICD-10-CM

## 2015-03-13 MED ORDER — ATORVASTATIN CALCIUM 40 MG PO TABS: 40.0000 mg | ORAL_TABLET | Freq: Every day | ORAL | Status: DC

## 2015-03-17 ENCOUNTER — Encounter: Payer: Self-pay | Admitting: *Deleted

## 2015-04-03 ENCOUNTER — Encounter: Payer: Self-pay | Admitting: Cardiology

## 2015-04-03 DIAGNOSIS — I480 Paroxysmal atrial fibrillation: Secondary | ICD-10-CM | POA: Diagnosis not present

## 2015-04-03 DIAGNOSIS — R918 Other nonspecific abnormal finding of lung field: Secondary | ICD-10-CM | POA: Diagnosis not present

## 2015-04-03 DIAGNOSIS — I2510 Atherosclerotic heart disease of native coronary artery without angina pectoris: Secondary | ICD-10-CM | POA: Diagnosis not present

## 2015-04-03 NOTE — Progress Notes (Signed)
Patient ID: Laura Jacobs, female   DOB: 12-31-1933, 81 y.o.   MRN: [id number]  Sadey, [place]  Date of visit:  04/03/2015 DOB:  04-Dec-1933    Age:  81 yrs. Medical record number:  [id number]     Account number:  [phone] Primary Care Provider: Redge Gainer ____________________________ CURRENT DIAGNOSES  1. CAD Native without angina  2. Paroxysmal atrial fibrillation  3. Abnormal chest Xray  4. Long term (current) use of anticoagulants  5. Essential (primary) hypertension  6. Old myocardial infarction  7. Hyperlipidemia  8. Chronic kidney disease, stage 3 (moderate)  9. Presence of aortocoronary bypass graft ____________________________ ALLERGIES  No Known Allergies ____________________________ MEDICATIONS  1. nitroglycerin 0.4 mg Tablet, Sublingual, PRN  2. Zetia 10 mg tablet, 1 p.o. daily  3. atorvastatin 40 mg tablet, 1 p.o. daily  4. benazepril 20 mg tablet, 1 p.o. daily  5. Eliquis 5 mg tablet, BID  6. amlodipine 5 mg tablet, 1 p.o. daily  7. metoprolol tartrate 25 mg tablet, 3 p.o. b.i.d. ____________________________ CHIEF COMPLAINTS  Followup of Atherosclerotic heart disease of native coronary artery without angina pectoris  Followup of Paroxysmal atrial fibrillation ____________________________ HISTORY OF PRESENT ILLNESS Patient returns for cardiac followup. She has had less dyspnea since she was here. Her activity has been back to normal and she has not had recurrence of atrial fibrillation. She did have some small nodules noted on CT scan in 3-6 month followup was recommended at that time. She isn't currently having any angina. She denies PND, orthopnea or edema. No bleeding complications from anticoagulation. ____________________________ PAST HISTORY  Past Medical Illnesses:  hypertension, hyperlipidemia, history of benign renal cyst, history of anemia-iron deficieny;  Cardiovascular Illnesses:  CAD, S/P MI-inferior 1998, atrial fibrillation;  Surgical  Procedures:  CABG w LIMA to D1-D2-LAD, SVG to OM-circ, SVG to PD-PL 05/23/97 [place];  NYHA Classification:  I;  Canadian Angina Classification:  Class 0: Asymptomatic;  Cardiology Procedures-Invasive:  cardiac cath (left) 1998;  Cardiology Procedures-Noninvasive:  treadmill cardiolite December 2009, event monitor December 2009, echocardiogram November 2015;  Cardiac Cath Results:  50% stenosis ostial Left main, 60% stenosis proximal LAD, 90% stenosis mid LAD, occluded mid CFX, 70% stenosis proximal RCA, 80% stenosis ostial PDA, occluded OM 2;  LVEF of 50% documented via echocardiogram on 09/05/2014,   CHA2DS2-VASC Score:  5 ____________________________ CARDIO-PULMONARY TEST DATES EKG Date:  01/27/2015;   Cardiac Cath Date:  05/16/1997;  CABG: 05/23/1997;  Nuclear Study Date:  09/28/2008;  Echocardiography Date: 09/05/2014;  Chest Xray Date: 01/24/2015;  CT Scan Date:  01/24/2015   ____________________________ FAMILY HISTORY Brother -- Brother dead, Heart disease Brother -- Brother dead, Death of unknown cause Brother -- Brother dead, Death of unknown cause Brother -- Brother dead, Death of unknown cause Brother -- Brother dead, Death of unknown cause Brother -- Brother dead, Death of unknown cause Brother -- Brother dead, Death of unknown cause Father -- Father dead, Alcoholism Mother -- Mother dead, Congestive heart failure Sister -- Sister alive with problem, Alzheimers disease Sister -- Sister dead, Alzheimers disease Sister -- Sister alive with problem, Medical history unknown ____________________________ SOCIAL HISTORY Alcohol Use:  no alcohol use;  Smoking:  used to smoke but quit 2015, greater than 50 pack year history;  Diet:  regular diet;  Lifestyle:  married and 6 children;  Exercise:  no regular exercise;  Occupation:  retired and Equities trader;  Residence:  lives with husband and 2 sons still at home;   ____________________________ REVIEW OF  SYSTEMS General:  denies recent weight  change, fatique or change in exercise tolerance.  Integumentary:easy bruisability Eyes: wears eye glasses/contact lenses Respiratory: denies dyspnea, cough, wheezing or hemoptysis. Cardiovascular:  please review HPI Abdominal: denies dyspepsia, GI bleeding, constipation, or diarrheaGenitourinary-Female: frequency, hesitancyNeurological:  denies headaches, stroke, or TIA  ____________________________ PHYSICAL EXAMINATION VITAL SIGNS  Blood Pressure:  130/60 Sitting, Right arm, regular cuff  , 122/64 Standing, Right arm and regular cuff   Pulse:  64/min. Weight:  140.00 lbs. Height:  65"BMI: 23  Constitutional:  pleasant white female, in no acute distress, thin Skin:  warm and dry to touch, no apparent skin lesions, or masses noted. Head:  normocephalic, normal hair pattern, no masses or tenderness Neck:  supple, no masses, thyromegaly, JVD. Carotid pulses are full and equal bilaterally without bruits. Chest:  healed median sternotomy scar, increased A-P diameter, fine rales base(s) Cardiac:  regular rhythm, normal S1 and S2, No S3 or S4, no murmurs, gallops or rubs detected. Peripheral Pulses:  femoral pulses 2+, dorsalis pedis pulses diminished, posterior tibial pulses diminished Extremities & Back:  well healed saphenous vein donor site RLE, mild bilateral venous insufficiency changes present Neurological:  no gross motor or sensory deficits noted, affect appropriate, oriented x3. ____________________________ MOST RECENT LIPID PANEL 12/01/14  CHOL TOTL 108 mg/dl, LDL 54 NM, HDL 34 mg/dl and TRIGLYCER 99 mg/dl ____________________________ IMPRESSIONS/PLAN 1. Coronary artery disease with previous bypass grafting 2. Chart says later fibrillation clinically in sinus rhythm 3. Long-term use of anticoagulation 4. Chronic kidney disease stage III  Recommendations:  Continue Eliquus. She doesn't have a lot of atrial fibrillation at this time. She will need a followup CT scan to evaluate the  nodule in 4 months and I will see her back in followup in 6 months. ____________________________ TODAYS ORDERS  1. CT Chest w/o Contrast: 4 months  2. Return Visit: 6 months  3. 12 Lead EKG: 6 months                       ____________________________ Cardiology Physician:  Kerry Hough MD [facility]

## 2015-04-13 ENCOUNTER — Other Ambulatory Visit: Payer: Self-pay | Admitting: Nurse Practitioner

## 2015-04-16 DIAGNOSIS — N289 Disorder of kidney and ureter, unspecified: Secondary | ICD-10-CM | POA: Diagnosis not present

## 2015-04-16 DIAGNOSIS — R609 Edema, unspecified: Secondary | ICD-10-CM | POA: Diagnosis not present

## 2015-04-16 DIAGNOSIS — I4891 Unspecified atrial fibrillation: Secondary | ICD-10-CM | POA: Diagnosis not present

## 2015-05-09 ENCOUNTER — Other Ambulatory Visit: Payer: Self-pay | Admitting: Nurse Practitioner

## 2015-05-31 ENCOUNTER — Ambulatory Visit: Payer: Medicare Other | Admitting: Nurse Practitioner

## 2015-06-01 ENCOUNTER — Encounter: Payer: Self-pay | Admitting: Nurse Practitioner

## 2015-06-01 ENCOUNTER — Ambulatory Visit (INDEPENDENT_AMBULATORY_CARE_PROVIDER_SITE_OTHER): Payer: Medicare Other | Admitting: Nurse Practitioner

## 2015-06-01 VITALS — BP 125/71 | HR 81 | Temp 96.5°F | Ht 63.0 in | Wt 147.0 lb

## 2015-06-01 DIAGNOSIS — I480 Paroxysmal atrial fibrillation: Secondary | ICD-10-CM | POA: Diagnosis not present

## 2015-06-01 DIAGNOSIS — Z7901 Long term (current) use of anticoagulants: Secondary | ICD-10-CM

## 2015-06-01 DIAGNOSIS — I2510 Atherosclerotic heart disease of native coronary artery without angina pectoris: Secondary | ICD-10-CM

## 2015-06-01 DIAGNOSIS — I10 Essential (primary) hypertension: Secondary | ICD-10-CM | POA: Diagnosis not present

## 2015-06-01 DIAGNOSIS — M810 Age-related osteoporosis without current pathological fracture: Secondary | ICD-10-CM

## 2015-06-01 DIAGNOSIS — N184 Chronic kidney disease, stage 4 (severe): Secondary | ICD-10-CM

## 2015-06-01 DIAGNOSIS — E785 Hyperlipidemia, unspecified: Secondary | ICD-10-CM

## 2015-06-01 DIAGNOSIS — M1A9XX0 Chronic gout, unspecified, without tophus (tophi): Secondary | ICD-10-CM

## 2015-06-01 DIAGNOSIS — I119 Hypertensive heart disease without heart failure: Secondary | ICD-10-CM

## 2015-06-01 MED ORDER — EZETIMIBE 10 MG PO TABS: 10.0000 mg | ORAL_TABLET | Freq: Every day | ORAL | Status: DC

## 2015-06-01 MED ORDER — METOPROLOL TARTRATE 25 MG PO TABS: ORAL_TABLET | ORAL | Status: DC

## 2015-06-01 MED ORDER — AMLODIPINE BESYLATE 5 MG PO TABS: 5.0000 mg | ORAL_TABLET | Freq: Every day | ORAL | Status: DC

## 2015-06-01 MED ORDER — TRIAMTERENE-HCTZ 37.5-25 MG PO TABS: 1.0000 | ORAL_TABLET | Freq: Every day | ORAL | Status: DC

## 2015-06-01 NOTE — Patient Instructions (Signed)
Bone Health Our bones do many things. They provide structure, protect organs, anchor muscles, and store calcium. Adequate calcium in your diet and weight-bearing physical activity help build strong bones, improve bone amounts, and may reduce the risk of weakening of bones (osteoporosis) later in life. PEAK BONE MASS By age 79, the average woman has acquired most of her skeletal bone mass. A large decline occurs in older adults which increases the risk of osteoporosis. In women this occurs around the time of menopause. It is important for young girls to reach their peak bone mass in order to maintain bone health throughout life. A person with high bone mass as a young adult will be more likely to have a higher bone mass later in life. Not enough calcium consumption and physical activity early on could result in a failure to achieve optimum bone mass in adulthood. OSTEOPOROSIS Osteoporosis is a disease of the bones. It is defined as low bone mass with deterioration of bone structure. Osteoporosis leads to an increase risk of fractures with falls. These fractures commonly happen in the wrist, hip, and spine. While men and women of all ages and background can develop osteoporosis, some of the risk factors for osteoporosis are:  Female.  White.  Postmenopausal.  Older adults.  Small in body size.  Eating a diet low in calcium.  Physically inactive.  Smoking.  Use of some medications.  Family history. CALCIUM Calcium is a mineral needed by the body for healthy bones, teeth, and proper function of the heart, muscles, and nerves. The body cannot produce calcium so it must be absorbed through food. Good sources of calcium include:  Dairy products (low fat or nonfat milk, cheese, and yogurt).  Dark green leafy vegetables (bok choy and broccoli).  Calcium fortified foods (orange juice, cereal, bread, soy beverages, and tofu products).  Nuts (almonds). Recommended amounts of calcium vary  for individuals. RECOMMENDED CALCIUM INTAKES Age and Amount in mg per day  Children 1 to 3 years / 700 mg  Children 4 to 8 years / 1,000 mg  Children 9 to 13 years / 1,300 mg  Teens 14 to 18 years / 1,300 mg  Adults 19 to 50 years / 1,000 mg  Adult women 51 to 70 years / 1,200 mg  Adults 71 years and older / 1,200 mg  Pregnant and breastfeeding teens / 1,300 mg  Pregnant and breastfeeding adults / 1,000 mg Vitamin D also plays an important role in healthy bone development. Vitamin D helps in the absorption of calcium. WEIGHT-BEARING PHYSICAL ACTIVITY Regular physical activity has many positive health benefits. Benefits include strong bones. Weight-bearing physical activity early in life is important in reaching peak bone mass. Weight-bearing physical activities cause muscles and bones to work against gravity. Some examples of weight bearing physical activities include:  Walking, jogging, or running.  Field Hockey.  Jumping rope.  Dancing.  Soccer.  Tennis or Racquetball.  Stair climbing.  Basketball.  Hiking.  Weight lifting.  Aerobic fitness classes. Including weight-bearing physical activity into an exercise plan is a great way to keep bones healthy. Adults: Engage in at least 30 minutes of moderate physical activity on most, preferably all, days of the week. Children: Engage in at least 60 minutes of moderate physical activity on most, preferably all, days of the week. FOR MORE INFORMATION [place] Department of Agriculture, Center for Nutrition Policy and Promotion: www.cnpp.usda.gov National Osteoporosis Foundation: www.nof.org Document Released: 12/14/2003 Document Revised: 01/18/2013 Document Reviewed: 03/15/2009 ExitCare Patient Information   2015 [employer]. This information is not intended to replace advice given to you by your health care provider. Make sure you discuss any questions you have with your health care provider.  

## 2015-06-01 NOTE — Progress Notes (Signed)
Subjective:    Patient ID: Laura Jacobs, female    DOB: [birth year], 81 y.o.   MRN: [id number]  Patient here today for follow up of chronic medical problems. No complaints today   Hypertension This is a chronic problem. The current episode started more than 1 year ago. The problem is unchanged. The problem is controlled. Pertinent negatives include no blurred vision, chest pain, headaches, neck pain, orthopnea, peripheral edema, shortness of breath or sweats. There are no associated agents to hypertension. Risk factors for coronary artery disease include dyslipidemia, post-menopausal state, sedentary lifestyle and smoking/tobacco exposure. Past treatments include calcium channel blockers, beta blockers and diuretics. The current treatment provides significant improvement. Compliance problems include diet and exercise.  Hypertensive end-organ damage includes kidney disease and CAD/MI (MI in 1998). Identifiable causes of hypertension include chronic renal disease.  Hyperlipidemia This is a chronic problem. The current episode started more than 1 year ago. The problem is uncontrolled. Recent lipid tests were reviewed and are high. Exacerbating diseases include chronic renal disease. She has no history of diabetes. Factors aggravating her hyperlipidemia include smoking and fatty foods. Pertinent negatives include no chest pain or shortness of breath. Current antihyperlipidemic treatment includes statins and ezetimibe. The current treatment provides moderate improvement of lipids. Compliance problems include adherence to diet and adherence to exercise.  Risk factors for coronary artery disease include hypertension, post-menopausal, a sedentary lifestyle and dyslipidemia.  GOUT Last flareup was several months ago- doing well right now- on no maintenance meds CAD Bypass 1998- no need to use nitrogylcerin at ome. Chronic kidney disease See nephrologist every 6 months-  Atrial fib/long term  anticoagulant Diagnosed in November- started on eliquis- no recent palpitations- no bleeding Osteoporosis Currently not on any meds. Very little weight bearing exercises    Review of Systems  Eyes: Negative for blurred vision.  Respiratory: Negative for shortness of breath.   Cardiovascular: Negative for chest pain and orthopnea.  Genitourinary:       Denies urinary incontinence  Musculoskeletal: Negative for neck pain.  Neurological: Negative for headaches.  All other systems reviewed and are negative.      Objective:   Physical Exam  Constitutional: She is oriented to person, place, and time. She appears well-developed and well-nourished.  HENT:  Nose: Nose normal.  Mouth/Throat: Oropharynx is clear and moist.  Eyes: EOM are normal.  Neck: Trachea normal, normal range of motion and full passive range of motion without pain. Neck supple. No JVD present. Carotid bruit is not present. No thyromegaly present.  Cardiovascular: Normal rate, regular rhythm, normal heart sounds and intact distal pulses.  Exam reveals no gallop and no friction rub.   No murmur heard. Pulmonary/Chest: Effort normal and breath sounds normal.  Crackles in bases bilaterally   Abdominal: Soft. Bowel sounds are normal. She exhibits no distension and no mass. There is no tenderness.  Musculoskeletal: Normal range of motion.  Compression hose in place  Lymphadenopathy:    She has no cervical adenopathy.  Neurological: She is alert and oriented to person, place, and time. She has normal reflexes.  Skin: Skin is warm and dry.  Psychiatric: She has a normal mood and affect. Her behavior is normal. Judgment and thought content normal.    BP 125/71 mmHg  Pulse 81  Temp(Src) 96.5 F (35.8 C) (Oral)  Ht 5' 3"  (1.6 m)  Wt 147 lb (66.679 kg)  BMI 26.05 kg/m2      Assessment & Plan:  1. Hypertensive heart disease  2. Coronary artery disease involving native coronary artery of native heart without angina  pectoris Keep follow  Up with cardiology  3. Paroxysmal atrial fibrillation - metoprolol tartrate (LOPRESSOR) 25 MG tablet; Take 81m (3 tablets) in the morning and 75 mg (3 tablets) at night.  Dispense: 150 tablet; Refill: 2  4. Osteoporosis Weight bearing exercises  5. Chronic kidney disease (CKD), stage IV (severe) Avoid NSAIDS  6. Hyperlipidemia Low fat diet - ezetimibe (ZETIA) 10 MG tablet; Take 1 tablet (10 mg total) by mouth daily.  Dispense: 30 tablet; Refill: 5 - Lipid panel  7. Long-term (current) use of anticoagulants  8. Chronic gout without tophus, unspecified cause, unspecified site  9. Essential hypertension Do not add salt to diet - amLODipine (NORVASC) 5 MG tablet; Take 1 tablet (5 mg total) by mouth daily.  Dispense: 30 tablet; Refill: 5 - triamterene-hydrochlorothiazide (MAXZIDE-25) 37.5-25 MG per tablet; Take 1 tablet by mouth daily.  Dispense: 30 tablet; Refill: 5 - CMP14+EGFR    Labs pending Health maintenance reviewed Diet and exercise encouraged Continue all meds Follow up  In 3 months   MAlianza FNP

## 2015-06-02 LAB — CMP14+EGFR
A/G RATIO: 1.8 (ref 1.1–2.5)
ALBUMIN: 4.6 g/dL (ref 3.5–4.8)
ALT: 13 IU/L (ref 0–32)
AST: 20 IU/L (ref 0–40)
Alkaline Phosphatase: 73 IU/L (ref 39–117)
BILIRUBIN TOTAL: 0.9 mg/dL (ref 0.0–1.2)
BUN / CREAT RATIO: 14 (ref 11–26)
BUN: 26 mg/dL (ref 8–27)
CHLORIDE: 99 mmol/L (ref 97–108)
CO2: 26 mmol/L (ref 18–29)
Calcium: 9.4 mg/dL (ref 8.7–10.3)
Creatinine, Ser: 1.88 mg/dL — ABNORMAL HIGH (ref 0.57–1.00)
GFR calc Af Amer: 29 mL/min/{1.73_m2} — ABNORMAL LOW (ref >59)
GFR, EST NON AFRICAN AMERICAN: 25 mL/min/{1.73_m2} — AB (ref >59)
GLOBULIN, TOTAL: 2.6 g/dL (ref 1.5–4.5)
Glucose: 91 mg/dL (ref 65–99)
POTASSIUM: 3.8 mmol/L (ref 3.5–5.2)
SODIUM: 142 mmol/L (ref 134–144)
TOTAL PROTEIN: 7.2 g/dL (ref 6.0–8.5)

## 2015-06-02 LAB — LIPID PANEL
Chol/HDL Ratio: 2.8 ratio units (ref 0.0–4.4)
Cholesterol, Total: 118 mg/dL (ref 100–199)
HDL: 42 mg/dL (ref >39)
LDL Calculated: 41 mg/dL (ref 0–99)
Triglycerides: 176 mg/dL — ABNORMAL HIGH (ref 0–149)
VLDL Cholesterol Cal: 35 mg/dL (ref 5–40)

## 2015-06-07 ENCOUNTER — Other Ambulatory Visit: Payer: Self-pay | Admitting: Nurse Practitioner

## 2015-06-07 DIAGNOSIS — N184 Chronic kidney disease, stage 4 (severe): Secondary | ICD-10-CM

## 2015-07-29 ENCOUNTER — Other Ambulatory Visit: Payer: Self-pay | Admitting: Nurse Practitioner

## 2015-08-17 ENCOUNTER — Other Ambulatory Visit: Payer: Self-pay | Admitting: Cardiology

## 2015-08-17 DIAGNOSIS — R918 Other nonspecific abnormal finding of lung field: Secondary | ICD-10-CM

## 2015-08-23 ENCOUNTER — Other Ambulatory Visit: Payer: Self-pay | Admitting: Nurse Practitioner

## 2015-08-30 ENCOUNTER — Other Ambulatory Visit: Payer: Self-pay

## 2015-09-04 ENCOUNTER — Encounter: Payer: Self-pay | Admitting: Nurse Practitioner

## 2015-09-04 ENCOUNTER — Ambulatory Visit (INDEPENDENT_AMBULATORY_CARE_PROVIDER_SITE_OTHER): Payer: Commercial Managed Care - HMO | Admitting: Nurse Practitioner

## 2015-09-04 VITALS — BP 125/69 | HR 104 | Temp 96.8°F | Ht 63.0 in | Wt 148.0 lb

## 2015-09-04 DIAGNOSIS — M810 Age-related osteoporosis without current pathological fracture: Secondary | ICD-10-CM | POA: Diagnosis not present

## 2015-09-04 DIAGNOSIS — N184 Chronic kidney disease, stage 4 (severe): Secondary | ICD-10-CM | POA: Diagnosis not present

## 2015-09-04 DIAGNOSIS — I2510 Atherosclerotic heart disease of native coronary artery without angina pectoris: Secondary | ICD-10-CM | POA: Diagnosis not present

## 2015-09-04 DIAGNOSIS — E785 Hyperlipidemia, unspecified: Secondary | ICD-10-CM | POA: Diagnosis not present

## 2015-09-04 DIAGNOSIS — I480 Paroxysmal atrial fibrillation: Secondary | ICD-10-CM | POA: Diagnosis not present

## 2015-09-04 DIAGNOSIS — Z7901 Long term (current) use of anticoagulants: Secondary | ICD-10-CM

## 2015-09-04 MED ORDER — ATORVASTATIN CALCIUM 40 MG PO TABS: 40.0000 mg | ORAL_TABLET | Freq: Every day | ORAL | Status: DC

## 2015-09-04 MED ORDER — APIXABAN 5 MG PO TABS: ORAL_TABLET | ORAL | Status: DC

## 2015-09-04 NOTE — Progress Notes (Signed)
Subjective:    Patient ID: Laura Jacobs, female    DOB: Mar 23, 1934, 81 y.o.   MRN: [id number]  Patient here today for follow up of chronic medical problems. No complaints today   Hypertension This is a chronic problem. The current episode started more than 1 year ago. The problem is unchanged. The problem is controlled. Pertinent negatives include no blurred vision, chest pain, headaches, neck pain, orthopnea, peripheral edema, shortness of breath or sweats. There are no associated agents to hypertension. Risk factors for coronary artery disease include dyslipidemia, post-menopausal state, sedentary lifestyle and smoking/tobacco exposure. Past treatments include calcium channel blockers, beta blockers and diuretics. The current treatment provides significant improvement. Compliance problems include diet and exercise.  Hypertensive end-organ damage includes kidney disease and CAD/MI (MI in 1998). Identifiable causes of hypertension include chronic renal disease.  Hyperlipidemia This is a chronic problem. The current episode started more than 1 year ago. The problem is uncontrolled. Recent lipid tests were reviewed and are high. Exacerbating diseases include chronic renal disease. She has no history of diabetes. Factors aggravating her hyperlipidemia include smoking and fatty foods. Pertinent negatives include no chest pain or shortness of breath. Current antihyperlipidemic treatment includes statins and ezetimibe. The current treatment provides moderate improvement of lipids. Compliance problems include adherence to diet and adherence to exercise.  Risk factors for coronary artery disease include hypertension, post-menopausal, a sedentary lifestyle and dyslipidemia.  GOUT Last flareup was several months ago- doing well right now- on no maintenance meds CAD Bypass 1998- no need to use nitrogylcerin at ome. Chronic kidney disease See nephrologist every 6 months-  Atrial fib/long term  anticoagulant Diagnosed in November- started on eliquis- no recent palpitations- no bleeding Osteoporosis Currently not on any meds. Very little weight bearing exercises    Review of Systems  Eyes: Negative for blurred vision.  Respiratory: Negative for shortness of breath.   Cardiovascular: Negative for chest pain and orthopnea.  Genitourinary:       Denies urinary incontinence  Musculoskeletal: Negative for neck pain.  Neurological: Negative for headaches.  All other systems reviewed and are negative.      Objective:   Physical Exam  Constitutional: She is oriented to person, place, and time. She appears well-developed and well-nourished.  HENT:  Nose: Nose normal.  Mouth/Throat: Oropharynx is clear and moist.  Eyes: EOM are normal.  Neck: Trachea normal, normal range of motion and full passive range of motion without pain. Neck supple. No JVD present. Carotid bruit is not present. No thyromegaly present.  Cardiovascular: Normal rate, regular rhythm, normal heart sounds and intact distal pulses.  Exam reveals no gallop and no friction rub.   No murmur heard. Pulmonary/Chest: Effort normal and breath sounds normal.  Crackles in bases bilaterally   Abdominal: Soft. Bowel sounds are normal. She exhibits no distension and no mass. There is no tenderness.  Musculoskeletal: Normal range of motion.  Compression hose in place  Lymphadenopathy:    She has no cervical adenopathy.  Neurological: She is alert and oriented to person, place, and time. She has normal reflexes.  Skin: Skin is warm and dry.  Psychiatric: She has a normal mood and affect. Her behavior is normal. Judgment and thought content normal.   BP 125/69 mmHg  Pulse 104  Temp(Src) 96.8 F (36 C) (Oral)  Ht _0  (1.6 m)  Wt 148 lb (67.132 kg)  BMI 26.22 kg/m2       Assessment & Plan:  1. Coronary artery disease involving  native coronary artery of native heart without angina pectoris Keep follow up with  cardiologist  2. Paroxysmal atrial fibrillation (HCC)  3. Osteoporosis Weight bearing exercises  4. Chronic kidney disease (CKD), stage IV (severe) ([place]) Will continue to watch labs  5. Hyperlipidemia Low fat diet - atorvastatin (LIPITOR) 40 MG tablet; Take 1 tablet (40 mg total) by mouth daily.  Dispense: 90 tablet; Refill: 1 - CMP14+EGFR - Lipid panel  6. Long-term (current) use of anticoagulants Let us know if starts any unusual bleeding - apixaban (ELIQUIS) 5 MG TABS tablet; TAKE ONE TABLET BY MOUTH TWICE A DAY AS DIRECTED  Dispense: 60 tablet; Refill: 5   Counseling done on all immunizations given today- flu Labs pending Health maintenance reviewed Diet and exercise encouraged Continue all meds Follow up  In 3 months   [place], FNP

## 2015-09-04 NOTE — Patient Instructions (Signed)
Fall Prevention in the Home  Falls can cause injuries and can affect people from all age groups. There are many simple things that you can do to make your home safe and to help prevent falls. WHAT CAN I DO ON THE OUTSIDE OF MY HOME?  Regularly repair the edges of walkways and driveways and fix any cracks.  Remove high doorway thresholds.  Trim any shrubbery on the main path into your home.  Use bright outdoor lighting.  Clear walkways of debris and clutter, including tools and rocks.  Regularly check that handrails are securely fastened and in good repair. Both sides of any steps should have handrails.  Install guardrails along the edges of any raised decks or porches.  Have leaves, snow, and ice cleared regularly.  Use sand or salt on walkways during winter months.  In the garage, clean up any spills right away, including grease or oil spills. WHAT CAN I DO IN THE BATHROOM?  Use night lights.  Install grab bars by the toilet and in the tub and shower. Do not use towel bars as grab bars.  Use non-skid mats or decals on the floor of the tub or shower.  If you need to sit down while you are in the shower, use a plastic, non-slip stool..  Keep the floor dry. Immediately clean up any water that spills on the floor.  Remove soap buildup in the tub or shower on a regular basis.  Attach bath mats securely with double-sided non-slip rug tape.  Remove throw rugs and other tripping hazards from the floor. WHAT CAN I DO IN THE BEDROOM?  Use night lights.  Make sure that a bedside light is easy to reach.  Do not use oversized bedding that drapes onto the floor.  Have a firm chair that has side arms to use for getting dressed.  Remove throw rugs and other tripping hazards from the floor. WHAT CAN I DO IN THE KITCHEN?   Clean up any spills right away.  Avoid walking on wet floors.  Place frequently used items in easy-to-reach places.  If you need to reach for something  above you, use a sturdy step stool that has a grab bar.  Keep electrical cables out of the way.  Do not use floor polish or wax that makes floors slippery. If you have to use wax, make sure that it is non-skid floor wax.  Remove throw rugs and other tripping hazards from the floor. WHAT CAN I DO IN THE STAIRWAYS?  Do not leave any items on the stairs.  Make sure that there are handrails on both sides of the stairs. Fix handrails that are broken or loose. Make sure that handrails are as long as the stairways.  Check any carpeting to make sure that it is firmly attached to the stairs. Fix any carpet that is loose or worn.  Avoid having throw rugs at the top or bottom of stairways, or secure the rugs with carpet tape to prevent them from moving.  Make sure that you have a light switch at the top of the stairs and the bottom of the stairs. If you do not have them, have them installed. WHAT ARE SOME OTHER FALL PREVENTION TIPS?  Wear closed-toe shoes that fit well and support your feet. Wear shoes that have rubber soles or low heels.  When you use a stepladder, make sure that it is completely opened and that the sides are firmly locked. Have someone hold the ladder while you   are using it. Do not climb a closed stepladder.  Add color or contrast paint or tape to grab bars and handrails in your home. Place contrasting color strips on the first and last steps.  Use mobility aids as needed, such as canes, walkers, scooters, and crutches.  Turn on lights if it is dark. Replace any light bulbs that burn out.  Set up furniture so that there are clear paths. Keep the furniture in the same spot.  Fix any uneven floor surfaces.  Choose a carpet design that does not hide the edge of steps of a stairway.  Be aware of any and all pets.  Review your medicines with your healthcare provider. Some medicines can cause dizziness or changes in blood pressure, which increase your risk of falling. Talk  with your health care provider about other ways that you can decrease your risk of falls. This may include working with a physical therapist or trainer to improve your strength, balance, and endurance.   This information is not intended to replace advice given to you by your health care provider. Make sure you discuss any questions you have with your health care provider.   Document Released: 09/13/2002 Document Revised: 02/07/2015 Document Reviewed: 10/28/2014 Elsevier Interactive Patient Education 2016 [employer]  

## 2015-09-05 LAB — LIPID PANEL
CHOL/HDL RATIO: 2.6 ratio (ref 0.0–4.4)
Cholesterol, Total: 99 mg/dL — ABNORMAL LOW (ref 100–199)
HDL: 38 mg/dL — AB (ref >39)
LDL Calculated: 43 mg/dL (ref 0–99)
TRIGLYCERIDES: 92 mg/dL (ref 0–149)
VLDL Cholesterol Cal: 18 mg/dL (ref 5–40)

## 2015-09-05 LAB — CMP14+EGFR
A/G RATIO: 1.6 (ref 1.1–2.5)
ALT: 8 IU/L (ref 0–32)
AST: 18 IU/L (ref 0–40)
Albumin: 4.4 g/dL (ref 3.5–4.8)
Alkaline Phosphatase: 88 IU/L (ref 39–117)
BUN/Creatinine Ratio: 20 (ref 11–26)
BUN: 29 mg/dL — ABNORMAL HIGH (ref 8–27)
Bilirubin Total: 0.6 mg/dL (ref 0.0–1.2)
CALCIUM: 9.1 mg/dL (ref 8.7–10.3)
CO2: 22 mmol/L (ref 18–29)
CREATININE: 1.48 mg/dL — AB (ref 0.57–1.00)
Chloride: 99 mmol/L (ref 97–106)
GFR calc Af Amer: 39 mL/min/{1.73_m2} — ABNORMAL LOW (ref >59)
GFR, EST NON AFRICAN AMERICAN: 33 mL/min/{1.73_m2} — AB (ref >59)
Globulin, Total: 2.7 g/dL (ref 1.5–4.5)
Glucose: 90 mg/dL (ref 65–99)
POTASSIUM: 3.5 mmol/L (ref 3.5–5.2)
Sodium: 140 mmol/L (ref 136–144)
Total Protein: 7.1 g/dL (ref 6.0–8.5)

## 2015-09-06 ENCOUNTER — Ambulatory Visit
Admission: RE | Admit: 2015-09-06 | Discharge: 2015-09-06 | Disposition: A | Discharge status: Home / Self Care | Payer: Commercial Managed Care - HMO | Source: Ambulatory Visit | Attending: Cardiology | Admitting: Cardiology

## 2015-09-06 DIAGNOSIS — R918 Other nonspecific abnormal finding of lung field: Secondary | ICD-10-CM

## 2015-09-09 ENCOUNTER — Other Ambulatory Visit: Payer: Self-pay | Admitting: Nurse Practitioner

## 2015-10-06 ENCOUNTER — Encounter: Payer: Self-pay | Admitting: Cardiology

## 2015-10-06 NOTE — Progress Notes (Signed)
Patient ID: Laura Jacobs, female   DOB: 06/17/1934, 81 y.o.   MRN: [id number]  Laura, Jacobs  Date of visit:  10/06/2015 DOB:  10/27/1933    Age:  81 yrs. Medical record number:  [id number]     Account number:  [id number] Primary Care Provider: Redge Gainer ____________________________ CURRENT DIAGNOSES  1. CAD Native without angina  2. Dyspnea  3. Abnormal chest Xray  4. Paroxysmal atrial fibrillation  5. Long term (current) use of anticoagulants  6. Essential (primary) hypertension  7. Old myocardial infarction  8. Hyperlipidemia  9. Chronic kidney disease, stage 3 (moderate)  10. Presence of aortocoronary bypass graft ____________________________ ALLERGIES  No Known Allergies ____________________________ MEDICATIONS  1. nitroglycerin 0.4 mg Tablet, Sublingual, PRN  2. Zetia 10 mg tablet, 1 p.o. daily  3. atorvastatin 40 mg tablet, 1 p.o. daily  4. benazepril 20 mg tablet, 1 p.o. daily  5. Eliquis 5 mg tablet, BID  6. amlodipine 5 mg tablet, 1 p.o. daily  7. metoprolol tartrate 75 mg tablet, BID ____________________________ CHIEF COMPLAINTS  Followup of CAD Native without angina  dyspnea ____________________________ HISTORY OF PRESENT ILLNESS Patient seen for cardiac followup. Since she was here she has been doing relatively well. She has tolerated anticoagulation without complications. Recent CT scan did not show any growth or nodules. She denies angina. She has occasional shortness of breath that will be relieved if she does deep breathing. She has no PND, orthopnea, edema, syncope, palpitations or claudication. ____________________________ PAST HISTORY  Past Medical Illnesses:  hypertension, hyperlipidemia, history of benign renal cyst, history of anemia-iron deficieny;  Cardiovascular Illnesses:  CAD, S/P MI-inferior 1998, atrial fibrillation;  Surgical Procedures:  CABG w LIMA to D1-D2-LAD, SVG to OM-circ, SVG to PD-PL 05/23/97 [place];  NYHA Classification:  I;   [place] Angina Classification:  Class 0: Asymptomatic;  Cardiology Procedures-Invasive:  cardiac cath (left) 1998;  Cardiology Procedures-Noninvasive:  treadmill cardiolite December 2009, event monitor December 2009, echocardiogram November 2015;  Cardiac Cath Results:  50% stenosis ostial Left main, 60% stenosis proximal LAD, 90% stenosis mid LAD, occluded mid CFX, 70% stenosis proximal RCA, 80% stenosis ostial PDA, occluded OM 2;  Peripheral Vascular Procedures:  no previous invasive peripheral vascular procedures.;  LVEF of 50% documented via echocardiogram on 09/05/2014,   CHA2DS2-VASC Score:  5 ____________________________ CARDIO-PULMONARY TEST DATES EKG Date:  01/27/2015;   Cardiac Cath Date:  05/16/1997;  CABG: 05/23/1997;  Nuclear Study Date:  09/28/2008;  Echocardiography Date: 09/05/2014;  Chest Xray Date: 01/24/2015;  CT Scan Date:  01/24/2015   ____________________________ FAMILY HISTORY Brother -- Brother dead, Heart disease Brother -- Brother dead, Death of unknown cause Brother -- Brother dead, Death of unknown cause Brother -- Brother dead, Death of unknown cause Brother -- Brother dead, Death of unknown cause Brother -- Brother dead, Death of unknown cause Brother -- Brother dead, Death of unknown cause Father -- Father dead, Alcoholism Mother -- Mother dead, Congestive heart failure Sister -- Sister alive with problem, Alzheimers disease Sister -- Sister dead, Alzheimers disease Sister -- Sister alive with problem, Medical history unknown ____________________________ SOCIAL HISTORY Alcohol Use:  no alcohol use;  Smoking:  used to smoke but quit 2015, greater than 50 pack year history;  Diet:  regular diet;  Lifestyle:  married and 6 children;  Exercise:  no regular exercise;  Occupation:  retired and Equities trader;  Residence:  lives with husband and 2 sons still at home;   ____________________________ REVIEW OF SYSTEMS General:  denies recent  weight change, fatique or change  in exercise tolerance.  Integumentary:easy bruisability Eyes: wears eye glasses/contact lenses Respiratory: denies dyspnea, cough, wheezing or hemoptysis. Cardiovascular:  please review HPI Abdominal: denies dyspepsia, GI bleeding, constipation, or diarrheaGenitourinary-Female: frequency, hesitancyNeurological:  denies headaches, stroke, or TIA  ____________________________ PHYSICAL EXAMINATION VITAL SIGNS  Blood Pressure:  132/68 Sitting, Left arm, regular cuff   Pulse:  60/min. Weight:  154.00 lbs. Height:  65"BMI: 25  Constitutional:  pleasant white female, in no acute distress Skin:  warm and dry to touch, no apparent skin lesions, or masses noted. Head:  normocephalic, normal hair pattern, no masses or tenderness Neck:  supple, no masses, thyromegaly, JVD. Carotid pulses are full and equal bilaterally without bruits. Chest:  healed median sternotomy scar, increased A-P diameter, fine rales base(s) Cardiac:  regular rhythm, normal S1 and S2, No S3 or S4, no murmurs, gallops or rubs detected. Peripheral Pulses:  femoral pulses 2+, dorsalis pedis pulses diminished, posterior tibial pulses diminished Extremities & Back:  well healed saphenous vein donor site RLE, mild bilateral venous insufficiency changes present Neurological:  no gross motor or sensory deficits noted, affect appropriate, oriented x3. ____________________________ MOST RECENT LIPID PANEL 12/01/14  CHOL TOTL 108 mg/dl, LDL 54 NM, HDL 34 mg/dl and TRIGLYCER 99 mg/dl ____________________________ IMPRESSIONS/PLAN  1. Paroxysmal atrial fibrillation without a lot of recurrence 2. Long-term use of anticoagulants 3. Coronary artery disease with previous bypass grafting 4. Stage III chronic kidney disease 5. Hyperlipidemia 6. Unexplained dyspnea  Recommendations:  Continue Eliquis as she is tolerating this well. Because of dyspnea obtain BNP level. Followup in 6 months with EKG and call if  problems.  ____________________________ TODAYS ORDERS  1. Return Visit: 6 months  2. 12 Lead EKG: 6 months  3. BNP: Today                       ____________________________ Cardiology Physician:  Kerry Hough MD [facility]

## 2015-11-08 ENCOUNTER — Other Ambulatory Visit: Payer: Self-pay | Admitting: Nurse Practitioner

## 2015-11-24 ENCOUNTER — Encounter: Payer: Self-pay | Admitting: Nurse Practitioner

## 2015-12-05 ENCOUNTER — Encounter: Payer: Self-pay | Admitting: Nurse Practitioner

## 2015-12-05 ENCOUNTER — Ambulatory Visit (INDEPENDENT_AMBULATORY_CARE_PROVIDER_SITE_OTHER): Payer: Commercial Managed Care - HMO | Admitting: Nurse Practitioner

## 2015-12-05 VITALS — BP 130/79 | HR 106 | Temp 96.9°F | Ht 63.0 in | Wt 156.0 lb

## 2015-12-05 DIAGNOSIS — M1A9XX0 Chronic gout, unspecified, without tophus (tophi): Secondary | ICD-10-CM

## 2015-12-05 DIAGNOSIS — I2510 Atherosclerotic heart disease of native coronary artery without angina pectoris: Secondary | ICD-10-CM | POA: Diagnosis not present

## 2015-12-05 DIAGNOSIS — N184 Chronic kidney disease, stage 4 (severe): Secondary | ICD-10-CM

## 2015-12-05 DIAGNOSIS — M810 Age-related osteoporosis without current pathological fracture: Secondary | ICD-10-CM

## 2015-12-05 DIAGNOSIS — I119 Hypertensive heart disease without heart failure: Secondary | ICD-10-CM | POA: Diagnosis not present

## 2015-12-05 DIAGNOSIS — I10 Essential (primary) hypertension: Secondary | ICD-10-CM | POA: Diagnosis not present

## 2015-12-05 DIAGNOSIS — I480 Paroxysmal atrial fibrillation: Secondary | ICD-10-CM | POA: Diagnosis not present

## 2015-12-05 DIAGNOSIS — E785 Hyperlipidemia, unspecified: Secondary | ICD-10-CM

## 2015-12-05 DIAGNOSIS — Z7901 Long term (current) use of anticoagulants: Secondary | ICD-10-CM

## 2015-12-05 MED ORDER — APIXABAN 5 MG PO TABS: ORAL_TABLET | ORAL | Status: DC

## 2015-12-05 MED ORDER — METOPROLOL TARTRATE 25 MG PO TABS: ORAL_TABLET | ORAL | Status: DC

## 2015-12-05 MED ORDER — AMLODIPINE BESYLATE 5 MG PO TABS: 5.0000 mg | ORAL_TABLET | Freq: Every day | ORAL | Status: DC

## 2015-12-05 MED ORDER — TRIAMTERENE-HCTZ 37.5-25 MG PO TABS: 1.0000 | ORAL_TABLET | Freq: Every day | ORAL | Status: DC

## 2015-12-05 MED ORDER — EZETIMIBE 10 MG PO TABS: 10.0000 mg | ORAL_TABLET | Freq: Every day | ORAL | Status: DC

## 2015-12-05 MED ORDER — ATORVASTATIN CALCIUM 40 MG PO TABS: 40.0000 mg | ORAL_TABLET | Freq: Every day | ORAL | Status: DC

## 2015-12-05 NOTE — Progress Notes (Signed)
Subjective:    Patient ID: Laura Jacobs, female    DOB: Jun 30, 1934, 82 y.o.   MRN: [id number]  Patient here today for follow up of chronic medical problems.  Outpatient Encounter Prescriptions as of 12/05/2015  Medication Sig  . amLODipine (NORVASC) 5 MG tablet Take 1 tablet (5 mg total) by mouth daily.  Marland Kitchen atorvastatin (LIPITOR) 40 MG tablet Take 1 tablet (40 mg total) by mouth daily.  . calcium-vitamin D (OSCAL 500/200 D-3) 500-200 MG-UNIT per tablet Take 1 tablet by mouth 2 (two) times daily.  Marland Kitchen ELIQUIS 5 MG TABS tablet TAKE ONE TABLET BY MOUTH TWICE A DAY AS DIRECTED  . ezetimibe (ZETIA) 10 MG tablet Take 1 tablet (10 mg total) by mouth daily.  . metoprolol tartrate (LOPRESSOR) 25 MG tablet Take 26m (3 tablets) in the morning and 75 mg (3 tablets) at night.  .Marland KitchenNITROSTAT 0.4 MG SL tablet PLACE 1 TABLET UNDER THE TONGUE EVERY 5 MINUTES AS NEEDED FOR CHEST PA IN  . triamterene-hydrochlorothiazide (MAXZIDE-25) 37.5-25 MG per tablet Take 1 tablet by mouth daily.   No facility-administered encounter medications on file as of 12/05/2015.     Hypertension This is a chronic problem. The current episode started more than 1 year ago. The problem is unchanged. The problem is controlled. Pertinent negatives include no blurred vision, chest pain, headaches, neck pain, orthopnea, peripheral edema, shortness of breath or sweats. There are no associated agents to hypertension. Risk factors for coronary artery disease include dyslipidemia, post-menopausal state, sedentary lifestyle and smoking/tobacco exposure. Past treatments include calcium channel blockers, beta blockers and diuretics. The current treatment provides significant improvement. Compliance problems include diet and exercise.  Hypertensive end-organ damage includes kidney disease and CAD/MI (MI in 1998). Identifiable causes of hypertension include chronic renal disease.  Hyperlipidemia This is a chronic problem. The current episode  started more than 1 year ago. The problem is uncontrolled. Recent lipid tests were reviewed and are high. Exacerbating diseases include chronic renal disease. She has no history of diabetes. Factors aggravating her hyperlipidemia include smoking and fatty foods. Pertinent negatives include no chest pain or shortness of breath. Current antihyperlipidemic treatment includes statins and ezetimibe. The current treatment provides moderate improvement of lipids. Compliance problems include adherence to diet and adherence to exercise.  Risk factors for coronary artery disease include hypertension, post-menopausal, a sedentary lifestyle and dyslipidemia.  GOUT Last flareup was several months ago- doing well right now- on no maintenance meds CAD Bypass 1998- no need to use nitrogylcerin at ome. Chronic kidney disease See nephrologist every 6 months-  Atrial fib/long term anticoagulant Diagnosed in November- started on eliquis- no recent palpitations- no bleeding Osteoporosis Currently not on any meds. Very little weight bearing exercises    Review of Systems  Eyes: Negative for blurred vision.  Respiratory: Negative for shortness of breath.   Cardiovascular: Negative for chest pain and orthopnea.  Genitourinary:       Denies urinary incontinence  Musculoskeletal: Negative for neck pain.  Neurological: Negative for headaches.  All other systems reviewed and are negative.      Objective:   Physical Exam  Constitutional: She is oriented to person, place, and time. She appears well-developed and well-nourished.  HENT:  Nose: Nose normal.  Mouth/Throat: Oropharynx is clear and moist.  Eyes: EOM are normal.  Neck: Trachea normal, normal range of motion and full passive range of motion without pain. Neck supple. No JVD present. Carotid bruit is not present. No thyromegaly present.  Cardiovascular: Normal  rate, regular rhythm, normal heart sounds and intact distal pulses.  Exam reveals no gallop and  no friction rub.   No murmur heard. Pulmonary/Chest: Effort normal and breath sounds normal.  Crackles in bases bilaterally   Abdominal: Soft. Bowel sounds are normal. She exhibits no distension and no mass. There is no tenderness.  Musculoskeletal: Normal range of motion.  Compression hose in place  Lymphadenopathy:    She has no cervical adenopathy.  Neurological: She is alert and oriented to person, place, and time. She has normal reflexes.  Skin: Skin is warm and dry.  Psychiatric: She has a normal mood and affect. Her behavior is normal. Judgment and thought content normal.   BP 130/79 mmHg  Pulse 106  Temp(Src) 96.9 F (36.1 C) (Oral)  Ht 5' 3" (1.6 m)  Wt 156 lb (70.761 kg)  BMI 27.64 kg/m2      Assessment & Plan:  1. Hypertensive heart disease  Do not add salt to diet - CMP14+EGFR- triamterene-hydrochlorothiazide (MAXZIDE-25) 37.5-25 MG tablet; Take 1 tablet by mouth daily.  Dispense: 30 tablet; Refill: 5 - amLODipine (NORVASC) 5 MG tablet; Take 1 tablet (5 mg total) by mouth daily.  Dispense: 30 tablet; Refill: 5   2. Coronary artery disease involving native coronary artery of native heart without angina pectoris  3. Paroxysmal atrial fibrillation (HCC) Avoid caffeine - metoprolol tartrate (LOPRESSOR) 25 MG tablet; Take 29m (3 tablets) in the morning and 75 mg (3 tablets) at night.  Dispense: 150 tablet; Refill: 2  4. [place] kidney disease ([place]D), [place]) [place] Will continue to watch  5. Hyperlipidemia Low fat diet - ezetimibe (ZETIA) 10 MG tablet; Take 1 tablet (10 mg total) by mouth daily.  Dispense: 30 tablet; Refill: 5 - atorvastatin (LIPITOR) 40 MG tablet; Take 1 tablet (40 mg total) by mouth daily.  Dispense: 90 tablet; Refill: 1 - Lipid panel  6. Long-term (current) use of anticoagulants Watch for bleeding or excessive bruising - apixaban (ELIQUIS) 5 MG TABS tablet; TAKE ONE TABLET BY MOUTH TWICE A DAY AS DIRECTED  Dispense: 60 tablet; Refill:  5  7. Chronic gout without tophus, unspecified cause, unspecified site   8. Osteoporosis Weight bearing exercises    Labs pending Health maintenance reviewed Diet and exercise encouraged Continue all meds Follow up  In 3 months   [place] FNP

## 2015-12-05 NOTE — Patient Instructions (Signed)
Fall Prevention in the Home  Falls can cause injuries and can affect people from all age groups. There are many simple things that you can do to make your home safe and to help prevent falls. WHAT CAN I DO ON THE OUTSIDE OF MY HOME?  Regularly repair the edges of walkways and driveways and fix any cracks.  Remove high doorway thresholds.  Trim any shrubbery on the main path into your home.  Use bright outdoor lighting.  Clear walkways of debris and clutter, including tools and rocks.  Regularly check that handrails are securely fastened and in good repair. Both sides of any steps should have handrails.  Install guardrails along the edges of any raised decks or porches.  Have leaves, snow, and ice cleared regularly.  Use sand or salt on walkways during winter months.  In the garage, clean up any spills right away, including grease or oil spills. WHAT CAN I DO IN THE BATHROOM?  Use night lights.  Install grab bars by the toilet and in the tub and shower. Do not use towel bars as grab bars.  Use non-skid mats or decals on the floor of the tub or shower.  If you need to sit down while you are in the shower, use a plastic, non-slip stool..  Keep the floor dry. Immediately clean up any water that spills on the floor.  Remove soap buildup in the tub or shower on a regular basis.  Attach bath mats securely with double-sided non-slip rug tape.  Remove throw rugs and other tripping hazards from the floor. WHAT CAN I DO IN THE BEDROOM?  Use night lights.  Make sure that a bedside light is easy to reach.  Do not use oversized bedding that drapes onto the floor.  Have a firm chair that has side arms to use for getting dressed.  Remove throw rugs and other tripping hazards from the floor. WHAT CAN I DO IN THE KITCHEN?   Clean up any spills right away.  Avoid walking on wet floors.  Place frequently used items in easy-to-reach places.  If you need to reach for something  above you, use a sturdy step stool that has a grab bar.  Keep electrical cables out of the way.  Do not use floor polish or wax that makes floors slippery. If you have to use wax, make sure that it is non-skid floor wax.  Remove throw rugs and other tripping hazards from the floor. WHAT CAN I DO IN THE STAIRWAYS?  Do not leave any items on the stairs.  Make sure that there are handrails on both sides of the stairs. Fix handrails that are broken or loose. Make sure that handrails are as long as the stairways.  Check any carpeting to make sure that it is firmly attached to the stairs. Fix any carpet that is loose or worn.  Avoid having throw rugs at the top or bottom of stairways, or secure the rugs with carpet tape to prevent them from moving.  Make sure that you have a light switch at the top of the stairs and the bottom of the stairs. If you do not have them, have them installed. WHAT ARE SOME OTHER FALL PREVENTION TIPS?  Wear closed-toe shoes that fit well and support your feet. Wear shoes that have rubber soles or low heels.  When you use a stepladder, make sure that it is completely opened and that the sides are firmly locked. Have someone hold the ladder while you   are using it. Do not climb a closed stepladder.  Add color or contrast paint or tape to grab bars and handrails in your home. Place contrasting color strips on the first and last steps.  Use mobility aids as needed, such as canes, walkers, scooters, and crutches.  Turn on lights if it is dark. Replace any light bulbs that burn out.  Set up furniture so that there are clear paths. Keep the furniture in the same spot.  Fix any uneven floor surfaces.  Choose a carpet design that does not hide the edge of steps of a stairway.  Be aware of any and all pets.  Review your medicines with your healthcare provider. Some medicines can cause dizziness or changes in blood pressure, which increase your risk of falling. Talk  with your health care provider about other ways that you can decrease your risk of falls. This may include working with a physical therapist or trainer to improve your strength, balance, and endurance.   This information is not intended to replace advice given to you by your health care provider. Make sure you discuss any questions you have with your health care provider.   Document Released: 09/13/2002 Document Revised: 02/07/2015 Document Reviewed: 10/28/2014 Elsevier Interactive Patient Education 2016 [employer]  

## 2015-12-06 LAB — CMP14+EGFR
A/G RATIO: 1.5 (ref 1.1–2.5)
ALK PHOS: 91 IU/L (ref 39–117)
ALT: 15 IU/L (ref 0–32)
AST: 20 IU/L (ref 0–40)
Albumin: 4.4 g/dL (ref 3.5–4.8)
BILIRUBIN TOTAL: 0.8 mg/dL (ref 0.0–1.2)
BUN/Creatinine Ratio: 19 (ref 11–26)
BUN: 25 mg/dL (ref 8–27)
CHLORIDE: 101 mmol/L (ref 96–106)
CO2: 25 mmol/L (ref 18–29)
Calcium: 9 mg/dL (ref 8.7–10.3)
Creatinine, Ser: 1.35 mg/dL — ABNORMAL HIGH (ref 0.57–1.00)
GFR calc non Af Amer: 37 mL/min/{1.73_m2} — ABNORMAL LOW (ref >59)
GFR, EST AFRICAN AMERICAN: 43 mL/min/{1.73_m2} — AB (ref >59)
GLUCOSE: 126 mg/dL — AB (ref 65–99)
Globulin, Total: 2.9 g/dL (ref 1.5–4.5)
POTASSIUM: 3.5 mmol/L (ref 3.5–5.2)
Sodium: 143 mmol/L (ref 134–144)
TOTAL PROTEIN: 7.3 g/dL (ref 6.0–8.5)

## 2015-12-06 LAB — SPECIMEN STATUS REPORT

## 2015-12-06 LAB — LIPID PANEL
CHOLESTEROL TOTAL: 107 mg/dL (ref 100–199)
Chol/HDL Ratio: 2.6 ratio units (ref 0.0–4.4)
HDL: 41 mg/dL (ref >39)
LDL Calculated: 44 mg/dL (ref 0–99)
TRIGLYCERIDES: 109 mg/dL (ref 0–149)
VLDL CHOLESTEROL CAL: 22 mg/dL (ref 5–40)

## 2016-01-09 ENCOUNTER — Telehealth: Payer: Self-pay

## 2016-01-09 NOTE — Telephone Encounter (Signed)
Insurance prior authorized Eliquis through 10/06/16

## 2016-01-23 DIAGNOSIS — E559 Vitamin D deficiency, unspecified: Secondary | ICD-10-CM | POA: Diagnosis not present

## 2016-01-23 DIAGNOSIS — I10 Essential (primary) hypertension: Secondary | ICD-10-CM | POA: Diagnosis not present

## 2016-01-23 DIAGNOSIS — N183 Chronic kidney disease, stage 3 (moderate): Secondary | ICD-10-CM | POA: Diagnosis not present

## 2016-01-23 DIAGNOSIS — D509 Iron deficiency anemia, unspecified: Secondary | ICD-10-CM | POA: Diagnosis not present

## 2016-01-23 DIAGNOSIS — Z79899 Other long term (current) drug therapy: Secondary | ICD-10-CM | POA: Diagnosis not present

## 2016-03-07 ENCOUNTER — Ambulatory Visit: Payer: Self-pay | Admitting: Nurse Practitioner

## 2016-03-08 ENCOUNTER — Encounter: Payer: Self-pay | Admitting: *Deleted

## 2016-04-05 ENCOUNTER — Other Ambulatory Visit: Payer: Self-pay | Admitting: Nurse Practitioner

## 2016-04-08 ENCOUNTER — Other Ambulatory Visit: Payer: Self-pay | Admitting: Nurse Practitioner

## 2016-04-16 DIAGNOSIS — I2510 Atherosclerotic heart disease of native coronary artery without angina pectoris: Secondary | ICD-10-CM | POA: Diagnosis not present

## 2016-04-16 DIAGNOSIS — I480 Paroxysmal atrial fibrillation: Secondary | ICD-10-CM | POA: Diagnosis not present

## 2016-04-23 ENCOUNTER — Ambulatory Visit: Payer: Self-pay | Admitting: Nurse Practitioner

## 2016-04-24 ENCOUNTER — Ambulatory Visit (INDEPENDENT_AMBULATORY_CARE_PROVIDER_SITE_OTHER): Payer: Medicare Other | Admitting: Nurse Practitioner

## 2016-04-24 ENCOUNTER — Encounter: Payer: Self-pay | Admitting: Nurse Practitioner

## 2016-04-24 VITALS — BP 112/74 | HR 55 | Temp 97.1°F | Ht 63.0 in | Wt 148.0 lb

## 2016-04-24 DIAGNOSIS — M1A9XX0 Chronic gout, unspecified, without tophus (tophi): Secondary | ICD-10-CM

## 2016-04-24 DIAGNOSIS — Z7901 Long term (current) use of anticoagulants: Secondary | ICD-10-CM

## 2016-04-24 DIAGNOSIS — M810 Age-related osteoporosis without current pathological fracture: Secondary | ICD-10-CM

## 2016-04-24 DIAGNOSIS — I10 Essential (primary) hypertension: Secondary | ICD-10-CM

## 2016-04-24 DIAGNOSIS — I2510 Atherosclerotic heart disease of native coronary artery without angina pectoris: Secondary | ICD-10-CM | POA: Diagnosis not present

## 2016-04-24 DIAGNOSIS — I119 Hypertensive heart disease without heart failure: Secondary | ICD-10-CM

## 2016-04-24 DIAGNOSIS — I480 Paroxysmal atrial fibrillation: Secondary | ICD-10-CM | POA: Diagnosis not present

## 2016-04-24 DIAGNOSIS — E785 Hyperlipidemia, unspecified: Secondary | ICD-10-CM | POA: Diagnosis not present

## 2016-04-24 DIAGNOSIS — N184 Chronic kidney disease, stage 4 (severe): Secondary | ICD-10-CM | POA: Diagnosis not present

## 2016-04-24 MED ORDER — EZETIMIBE 10 MG PO TABS: 10.0000 mg | ORAL_TABLET | Freq: Every day | ORAL | Status: DC

## 2016-04-24 MED ORDER — ATORVASTATIN CALCIUM 40 MG PO TABS: 40.0000 mg | ORAL_TABLET | Freq: Every day | ORAL | Status: DC

## 2016-04-24 MED ORDER — METOPROLOL TARTRATE 25 MG PO TABS: ORAL_TABLET | ORAL | Status: DC

## 2016-04-24 MED ORDER — AMLODIPINE BESYLATE 5 MG PO TABS: 5.0000 mg | ORAL_TABLET | Freq: Every day | ORAL | Status: DC

## 2016-04-24 MED ORDER — TRIAMTERENE-HCTZ 37.5-25 MG PO TABS: 1.0000 | ORAL_TABLET | Freq: Every day | ORAL | Status: DC

## 2016-04-24 NOTE — Patient Instructions (Signed)
Fall Prevention in the Home  Falls can cause injuries and can affect people from all age groups. There are many simple things that you can do to make your home safe and to help prevent falls. WHAT CAN I DO ON THE OUTSIDE OF MY HOME?  Regularly repair the edges of walkways and driveways and fix any cracks.  Remove high doorway thresholds.  Trim any shrubbery on the main path into your home.  Use bright outdoor lighting.  Clear walkways of debris and clutter, including tools and rocks.  Regularly check that handrails are securely fastened and in good repair. Both sides of any steps should have handrails.  Install guardrails along the edges of any raised decks or porches.  Have leaves, snow, and ice cleared regularly.  Use sand or salt on walkways during winter months.  In the garage, clean up any spills right away, including grease or oil spills. WHAT CAN I DO IN THE BATHROOM?  Use night lights.  Install grab bars by the toilet and in the tub and shower. Do not use towel bars as grab bars.  Use non-skid mats or decals on the floor of the tub or shower.  If you need to sit down while you are in the shower, use a plastic, non-slip stool..  Keep the floor dry. Immediately clean up any water that spills on the floor.  Remove soap buildup in the tub or shower on a regular basis.  Attach bath mats securely with double-sided non-slip rug tape.  Remove throw rugs and other tripping hazards from the floor. WHAT CAN I DO IN THE BEDROOM?  Use night lights.  Make sure that a bedside light is easy to reach.  Do not use oversized bedding that drapes onto the floor.  Have a firm chair that has side arms to use for getting dressed.  Remove throw rugs and other tripping hazards from the floor. WHAT CAN I DO IN THE KITCHEN?   Clean up any spills right away.  Avoid walking on wet floors.  Place frequently used items in easy-to-reach places.  If you need to reach for something  above you, use a sturdy step stool that has a grab bar.  Keep electrical cables out of the way.  Do not use floor polish or wax that makes floors slippery. If you have to use wax, make sure that it is non-skid floor wax.  Remove throw rugs and other tripping hazards from the floor. WHAT CAN I DO IN THE STAIRWAYS?  Do not leave any items on the stairs.  Make sure that there are handrails on both sides of the stairs. Fix handrails that are broken or loose. Make sure that handrails are as long as the stairways.  Check any carpeting to make sure that it is firmly attached to the stairs. Fix any carpet that is loose or worn.  Avoid having throw rugs at the top or bottom of stairways, or secure the rugs with carpet tape to prevent them from moving.  Make sure that you have a light switch at the top of the stairs and the bottom of the stairs. If you do not have them, have them installed. WHAT ARE SOME OTHER FALL PREVENTION TIPS?  Wear closed-toe shoes that fit well and support your feet. Wear shoes that have rubber soles or low heels.  When you use a stepladder, make sure that it is completely opened and that the sides are firmly locked. Have someone hold the ladder while you   are using it. Do not climb a closed stepladder.  Add color or contrast paint or tape to grab bars and handrails in your home. Place contrasting color strips on the first and last steps.  Use mobility aids as needed, such as canes, walkers, scooters, and crutches.  Turn on lights if it is dark. Replace any light bulbs that burn out.  Set up furniture so that there are clear paths. Keep the furniture in the same spot.  Fix any uneven floor surfaces.  Choose a carpet design that does not hide the edge of steps of a stairway.  Be aware of any and all pets.  Review your medicines with your healthcare provider. Some medicines can cause dizziness or changes in blood pressure, which increase your risk of falling. Talk  with your health care provider about other ways that you can decrease your risk of falls. This may include working with a physical therapist or trainer to improve your strength, balance, and endurance.   This information is not intended to replace advice given to you by your health care provider. Make sure you discuss any questions you have with your health care provider.   Document Released: 09/13/2002 Document Revised: 02/07/2015 Document Reviewed: 10/28/2014 Elsevier Interactive Patient Education 2016 [employer]  

## 2016-04-24 NOTE — Progress Notes (Signed)
Subjective:    Patient ID: Laura Jacobs, female    DOB: Jan 30, 1934, 82 y.o.   MRN: [id number]  Patient here today for follow up of chronic medical problems.  Outpatient Encounter Prescriptions as of 04/24/2016  Medication Sig  . amLODipine (NORVASC) 5 MG tablet Take 1 tablet (5 mg total) by mouth daily.  Marland Kitchen apixaban (ELIQUIS) 5 MG TABS tablet TAKE ONE TABLET BY MOUTH TWICE A DAY AS DIRECTED  . atorvastatin (LIPITOR) 40 MG tablet TAKE 1 TABLET DAILY  . calcium-vitamin D (OSCAL 500/200 D-3) 500-200 MG-UNIT per tablet Take 1 tablet by mouth 2 (two) times daily.  Marland Kitchen ezetimibe (ZETIA) 10 MG tablet Take 1 tablet (10 mg total) by mouth daily.  . metoprolol tartrate (LOPRESSOR) 25 MG tablet TAKE 3 TABLETS TWICE DAILY  . NITROSTAT 0.4 MG SL tablet PLACE 1 TABLET UNDER THE TONGUE EVERY 5 MINUTES AS NEEDED FOR CHEST PA IN  . triamterene-hydrochlorothiazide (MAXZIDE-25) 37.5-25 MG tablet Take 1 tablet by mouth daily.   No facility-administered encounter medications on file as of 04/24/2016.     Hypertension This is a chronic problem. The current episode started more than 1 year ago. The problem is unchanged. The problem is controlled. Pertinent negatives include no blurred vision, chest pain, headaches, neck pain, orthopnea, peripheral edema, shortness of breath or sweats. There are no associated agents to hypertension. Risk factors for coronary artery disease include dyslipidemia, post-menopausal state, sedentary lifestyle and smoking/tobacco exposure. Past treatments include calcium channel blockers, beta blockers and diuretics. The current treatment provides significant improvement. Compliance problems include diet and exercise.  Hypertensive end-organ damage includes kidney disease and CAD/MI (MI in 1998). Identifiable causes of hypertension include chronic renal disease.  Hyperlipidemia This is a chronic problem. The current episode started more than 1 year ago. The problem is uncontrolled.  Recent lipid tests were reviewed and are high. Exacerbating diseases include chronic renal disease. She has no history of diabetes. Factors aggravating her hyperlipidemia include smoking and fatty foods. Pertinent negatives include no chest pain or shortness of breath. Current antihyperlipidemic treatment includes statins and ezetimibe. The current treatment provides moderate improvement of lipids. Compliance problems include adherence to diet and adherence to exercise.  Risk factors for coronary artery disease include hypertension, post-menopausal, a sedentary lifestyle and dyslipidemia.  GOUT Last flareup was several months ago- doing well right now- on no maintenance meds CAD Bypass 1998- no need to use nitrogylcerin at ome. Chronic kidney disease See nephrologist every 6 months-  Atrial fib/long term anticoagulant Diagnosed in November- started on eliquis- no recent palpitations- no bleeding Osteoporosis Currently not on any meds. Very little weight bearing exercises    Review of Systems  Eyes: Negative for blurred vision.  Respiratory: Negative for shortness of breath.   Cardiovascular: Negative for chest pain and orthopnea.  Genitourinary:       Denies urinary incontinence  Musculoskeletal: Negative for neck pain.  Neurological: Negative for headaches.  All other systems reviewed and are negative.      Objective:   Physical Exam  Constitutional: She is oriented to person, place, and time. She appears well-developed and well-nourished.  HENT:  Nose: Nose normal.  Mouth/Throat: Oropharynx is clear and moist.  Eyes: EOM are normal.  Neck: Trachea normal, normal range of motion and full passive range of motion without pain. Neck supple. No JVD present. Carotid bruit is not present. No thyromegaly present.  Cardiovascular: Normal rate, regular rhythm, normal heart sounds and intact distal pulses.  Exam reveals no  gallop and no friction rub.   No murmur heard. Pulmonary/Chest:  Effort normal and breath sounds normal.  Crackles in bases bilaterally   Abdominal: Soft. Bowel sounds are normal. She exhibits no distension and no mass. There is no tenderness.  Musculoskeletal: Normal range of motion.  Compression hose in place  Lymphadenopathy:    She has no cervical adenopathy.  Neurological: She is alert and oriented to person, place, and time. She has normal reflexes.  Skin: Skin is warm and dry.  Psychiatric: She has a normal mood and affect. Her behavior is normal. Judgment and thought content normal.   BP 112/74 mmHg  Pulse 55  Temp(Src) 97.1 F (36.2 C) (Oral)  Ht _0  (1.6 m)  Wt 148 lb (67.132 kg)  BMI 26.22 kg/m2      Assessment & Plan:  1. Hypertensive heart disease   2. Coronary artery disease involving native coronary artery of native heart without angina pectoris Keep follow up with cardiologist - metoprolol tartrate (LOPRESSOR) 25 MG tablet; TAKE 3 TABLETS TWICE DAILY  Dispense: 540 tablet; Refill: 1  3. Paroxysmal atrial fibrillation (HCC)  4. Osteoporosis Weight bearing exercise when can tolerate  5. Chronic kidney disease (CKD), stage IV (severe) (HCC) Will continue to watch labs  6. Hyperlipidemia Low fta diet - ezetimibe (ZETIA) 10 MG tablet; Take 1 tablet (10 mg total) by mouth daily.  Dispense: 90 tablet; Refill: 1 - atorvastatin (LIPITOR) 40 MG tablet; Take 1 tablet (40 mg total) by mouth daily.  Dispense: 90 tablet; Refill: 1 - Lipid panel  7. Chronic gout without tophus, unspecified cause, unspecified site  8. Long-term (current) use of anticoagulants  9. Essential hypertension Do not add salt to diet - triamterene-hydrochlorothiazide (MAXZIDE-25) 37.5-25 MG tablet; Take 1 tablet by mouth daily.  Dispense: 30 tablet; Refill: 5 - amLODipine (NORVASC) 5 MG tablet; Take 1 tablet (5 mg total) by mouth daily.  Dispense: 90 tablet; Refill: 1 - CMP14+EGFR    Labs pending Health maintenance reviewed Diet and exercise  encouraged Continue all meds Follow up  In 3 month   [place], FNP

## 2016-04-25 ENCOUNTER — Telehealth: Payer: Self-pay | Admitting: *Deleted

## 2016-04-25 LAB — LIPID PANEL
CHOL/HDL RATIO: 3.3 ratio (ref 0.0–4.4)
Cholesterol, Total: 114 mg/dL (ref 100–199)
HDL: 35 mg/dL — AB (ref >39)
LDL Calculated: 49 mg/dL (ref 0–99)
Triglycerides: 149 mg/dL (ref 0–149)
VLDL Cholesterol Cal: 30 mg/dL (ref 5–40)

## 2016-04-25 LAB — CMP14+EGFR
A/G RATIO: 1.6 (ref 1.2–2.2)
ALBUMIN: 4.4 g/dL (ref 3.5–4.7)
ALT: 7 IU/L (ref 0–32)
AST: 19 IU/L (ref 0–40)
Alkaline Phosphatase: 86 IU/L (ref 39–117)
BUN / CREAT RATIO: 12 (ref 12–28)
BUN: 30 mg/dL — ABNORMAL HIGH (ref 8–27)
Bilirubin Total: 0.9 mg/dL (ref 0.0–1.2)
CALCIUM: 9.3 mg/dL (ref 8.7–10.3)
CO2: 26 mmol/L (ref 18–29)
Chloride: 102 mmol/L (ref 96–106)
Creatinine, Ser: 2.44 mg/dL — ABNORMAL HIGH (ref 0.57–1.00)
GFR, EST AFRICAN AMERICAN: 21 mL/min/{1.73_m2} — AB (ref >59)
GFR, EST NON AFRICAN AMERICAN: 18 mL/min/{1.73_m2} — AB (ref >59)
GLOBULIN, TOTAL: 2.7 g/dL (ref 1.5–4.5)
Glucose: 93 mg/dL (ref 65–99)
POTASSIUM: 3.5 mmol/L (ref 3.5–5.2)
Sodium: 145 mmol/L — ABNORMAL HIGH (ref 134–144)
TOTAL PROTEIN: 7.1 g/dL (ref 6.0–8.5)

## 2016-04-25 NOTE — Telephone Encounter (Signed)
Pt notified of results Verbalizes understanding 

## 2016-04-25 NOTE — Telephone Encounter (Signed)
Pt has appt with nephrologist in August Sees Dr Birdie Sons in [place]

## 2016-04-25 NOTE — Telephone Encounter (Signed)
-----   Message from [place], [place] sent at 04/25/2016  1:04 PM EDT ----- Kidney function is worsening- has patient seen nephrologist- if not needs referral Cholesterol looks good Continue current meds- low fat diet and exercise and recheck in 3 months

## 2016-05-14 ENCOUNTER — Emergency Department (HOSPITAL_COMMUNITY)
Admission: EM | Admit: 2016-05-14 | Discharge: 2016-05-15 | Disposition: A | Discharge status: Home / Self Care | Payer: Medicare Other | Attending: Emergency Medicine | Admitting: Emergency Medicine

## 2016-05-14 ENCOUNTER — Encounter (HOSPITAL_COMMUNITY): Payer: Self-pay | Admitting: *Deleted

## 2016-05-14 DIAGNOSIS — Z87891 Personal history of nicotine dependence: Secondary | ICD-10-CM | POA: Diagnosis not present

## 2016-05-14 DIAGNOSIS — I2510 Atherosclerotic heart disease of native coronary artery without angina pectoris: Secondary | ICD-10-CM | POA: Diagnosis not present

## 2016-05-14 DIAGNOSIS — R001 Bradycardia, unspecified: Secondary | ICD-10-CM | POA: Insufficient documentation

## 2016-05-14 DIAGNOSIS — I129 Hypertensive chronic kidney disease with stage 1 through stage 4 chronic kidney disease, or unspecified chronic kidney disease: Secondary | ICD-10-CM | POA: Insufficient documentation

## 2016-05-14 DIAGNOSIS — R1084 Generalized abdominal pain: Secondary | ICD-10-CM | POA: Diagnosis not present

## 2016-05-14 DIAGNOSIS — N184 Chronic kidney disease, stage 4 (severe): Secondary | ICD-10-CM | POA: Insufficient documentation

## 2016-05-14 DIAGNOSIS — Z79899 Other long term (current) drug therapy: Secondary | ICD-10-CM | POA: Diagnosis not present

## 2016-05-14 NOTE — ED Triage Notes (Signed)
Pt c/o right upper abdominal that radiates around to her front; pt states she has been feeling dizzy

## 2016-05-14 NOTE — ED Provider Notes (Signed)
Emergency Department Provider Note   I have reviewed the triage vital signs and the nursing notes.   HISTORY  Chief Complaint Abdominal Pain   HPI LAKEVA CLEMSON is a 82 y.o. female with PMHx of Acute MI, CAD, HTN, HLD, CKD complaining of acute onset, sharp, intermittent, right sided abdominal pain onset this morning. She states episodes of pain last only a few minutes and then subside. Pt denies abdominal PSHx  Pt reports she was light headed, but states that her lightheadedness preceded the abdominal pain. Pt states she was laying down when she first noticed abdominal pain. Pt states she has not had similar symptoms before. Pt has not taken any medications to relieve pain. She is unaware of any exacerbating or relieving factors of her pain. Pt denies vaginal bleeding or discharge, fever, nausea, vomiting, chills, chest pain, shortness of breath, urinary symptoms, dysuria, changes in appetite. Pt states she has kidney trouble, but states she sees a specialist on a regular basis.  Past Medical History:  Diagnosis Date  . Acute MI ([place])   . Atrial fibrillation, unspecified   . CAD (coronary artery disease)   . Gout   . Hyperlipidemia   . Hypertension     Patient Active Problem List   Diagnosis Date Noted  . Osteoporosis 02/27/2015  . Low serum vitamin D 02/27/2015  . Paroxysmal atrial fibrillation (HCC)   . Elira Colasanti-term (current) use of anticoagulants   . Lung nodule 08/23/2014  . CAD (coronary artery disease) 08/11/2013  . Gout 05/10/2013  . Hyperlipidemia 02/04/2013  . Chronic kidney disease (CKD), stage IV (severe) ([place]) 02/04/2013  . Hypertensive heart disease      Past Surgical History:  Procedure Laterality Date  . heart by pass      Current Outpatient Rx  . Order #: [id number] Class: Normal  . Order #: [id number] Class: Normal  . Order #: [id number] Class: Normal  . Order #: [id number] Class: OTC  . Order #: [id number] Class: Normal  . Order #: [id number] Class:  Normal  . Order #: [id number] Class: Normal    Allergies Review of patient's allergies indicates no known allergies.  Family History  Problem Relation Age of Onset  . Heart disease Mother   . Alcohol abuse Father   . Alzheimer's disease Sister   . Cancer Sister     Social History Social History  Substance Use Topics  . Smoking status: Former Smoker    Packs/day: 0.50    Years: 60.00    Types: Cigarettes    Quit date: 08/07/2014  . Smokeless tobacco: Former Systems developer    Quit date: 05/11/2014  . Alcohol use No    Review of Systems Constitutional: No fever/chills. Negative appetite change. Eyes: No visual changes. ENT: No sore throat. Cardiovascular: Denies chest pain. Respiratory: Denies shortness of breath. Gastrointestinal: Positive abdominal pain. No nausea, no vomiting.  No diarrhea.  Genitourinary: Negative for dysuria, vaginal bleeding, vaginal discharge, dysuria. Denies urinary symptoms. Musculoskeletal: Negative for back pain. Skin: Negative for rash. Neurological: Positive for lightheadedness.  10-point ROS otherwise negative.  ____________________________________________   PHYSICAL EXAM:  VITAL SIGNS: ED Triage Vitals [05/14/16 2030]  Enc Vitals Group     BP 137/71     Pulse Rate 73     Resp 18     Temp 97.8 F (36.6 C)     Temp Source Oral     SpO2 98 %     Weight 140 lb (63.5 kg)     Height 5\' 5"  (1.651 m)  Pain Score 7   Constitutional: Alert and oriented. Well appearing and in no acute distress. Eyes: Conjunctivae are normal. PERRL. EOMI. Head: Atraumatic. Nose: No congestion/rhinnorhea. Mouth/Throat: Mucous membranes are moist.  Oropharynx non-erythematous. Neck: No stridor.  Cardiovascular: Normal rate, regular rhythm. Good peripheral circulation. Grossly normal heart sounds.   Respiratory: Normal respiratory effort.  No retractions. Lungs CTAB. Gastrointestinal: Soft and non-tender to palpation throughout. No distention. No CVA tenderness to  percussion.  Musculoskeletal: No lower extremity tenderness nor edema. No gross deformities of extremities. Neurologic:  Normal speech and language. No gross focal neurologic deficits are appreciated.  Skin:  Skin is warm, dry and intact. No rash noted. Psychiatric: Mood and affect are normal. Speech and behavior are normal.  ____________________________________________   LABS (all labs ordered are listed, but only abnormal results are displayed)  Labs Reviewed  URINALYSIS, ROUTINE W REFLEX MICROSCOPIC (NOT AT [facility]) - Abnormal; Notable for the following:       Result Value   Hgb urine dipstick TRACE (*)    Leukocytes, UA SMALL (*)    All other components within normal limits  COMPREHENSIVE METABOLIC PANEL - Abnormal; Notable for the following:    Glucose, Bld 102 (*)    BUN 39 (*)    Creatinine, Ser 2.73 (*)    Calcium 8.7 (*)    ALT 13 (*)    Total Bilirubin 1.3 (*)    GFR calc non Af Amer 15 (*)    GFR calc Af Amer 18 (*)    All other components within normal limits  CBC WITH DIFFERENTIAL/PLATELET - Abnormal; Notable for the following:    HCT 35.9 (*)    All other components within normal limits  URINE MICROSCOPIC-ADD ON - Abnormal; Notable for the following:    Squamous Epithelial / LPF 6-30 (*)    Bacteria, UA FEW (*)    All other components within normal limits  LIPASE, BLOOD  I-STAT TROPOININ, ED   ____________________________________________  EKG  Reviewed in MUSE. No STEMI.  ____________________________________________  RADIOLOGY  None ____________________________________________   PROCEDURES  Procedure(s) performed:   Procedures  None ____________________________________________   INITIAL IMPRESSION / ASSESSMENT AND PLAN / ED COURSE  Pertinent labs & imaging results that were available during my care of the patient were reviewed by me and considered in my medical decision making (see chart for details).  Patient presents to the ED for  evaluation of intermittent right lower quadrant abdominal pain. No obvious inciting factors. Pain resolves spontaneously. Patient is pain-free at this time. No chest pain or SOB. Low suspicion for anginal equivalent in this case. EKG and troponin are negative. Abdomen is soft and completely non-tender to palpation. Labs are unremarkable. Following UA and will likely discharge if patient remains pain-free and UA shows no evidence of infection.   Care transferred to Dr. Christy Gentles who will follow UA results and assist with disposition. Updated patient and reviewed labs/EKG. Patient remains pain-free.   ____________________________________________  FINAL CLINICAL IMPRESSION(S) / ED DIAGNOSES  Final diagnoses:  Generalized abdominal pain  Bradycardia     MEDICATIONS GIVEN DURING THIS VISIT:  None  NEW OUTPATIENT MEDICATIONS STARTED DURING THIS VISIT:  None   Note:  This document was prepared using Dragon voice recognition software and may include unintentional dictation errors.  Nanda Quinton, MD Emergency Medicine\    Margette Fast, MD 05/15/16 [phone]

## 2016-05-15 ENCOUNTER — Telehealth: Payer: Self-pay

## 2016-05-15 DIAGNOSIS — R1084 Generalized abdominal pain: Secondary | ICD-10-CM | POA: Diagnosis not present

## 2016-05-15 LAB — COMPREHENSIVE METABOLIC PANEL
ALK PHOS: 67 U/L (ref 38–126)
ALT: 13 U/L — ABNORMAL LOW (ref 14–54)
ANION GAP: 9 (ref 5–15)
AST: 21 U/L (ref 15–41)
Albumin: 4.5 g/dL (ref 3.5–5.0)
BUN: 39 mg/dL — ABNORMAL HIGH (ref 6–20)
CALCIUM: 8.7 mg/dL — AB (ref 8.9–10.3)
CO2: 26 mmol/L (ref 22–32)
Chloride: 102 mmol/L (ref 101–111)
Creatinine, Ser: 2.73 mg/dL — ABNORMAL HIGH (ref 0.44–1.00)
GFR calc non Af Amer: 15 mL/min — ABNORMAL LOW (ref >60)
GFR, EST AFRICAN AMERICAN: 18 mL/min — AB (ref >60)
Glucose, Bld: 102 mg/dL — ABNORMAL HIGH (ref 65–99)
Potassium: 3.5 mmol/L (ref 3.5–5.1)
Sodium: 137 mmol/L (ref 135–145)
TOTAL PROTEIN: 7.7 g/dL (ref 6.5–8.1)
Total Bilirubin: 1.3 mg/dL — ABNORMAL HIGH (ref 0.3–1.2)

## 2016-05-15 LAB — CBC WITH DIFFERENTIAL/PLATELET
BASOS ABS: 0 10*3/uL (ref 0.0–0.1)
BASOS PCT: 1 %
EOS ABS: 0 10*3/uL (ref 0.0–0.7)
Eosinophils Relative: 0 %
HCT: 35.9 % — ABNORMAL LOW (ref 36.0–46.0)
HEMOGLOBIN: 12.5 g/dL (ref 12.0–15.0)
LYMPHS ABS: 1.7 10*3/uL (ref 0.7–4.0)
Lymphocytes Relative: 24 %
MCH: 31.6 pg (ref 26.0–34.0)
MCHC: 34.8 g/dL (ref 30.0–36.0)
MCV: 90.7 fL (ref 78.0–100.0)
Monocytes Absolute: 0.9 10*3/uL (ref 0.1–1.0)
Monocytes Relative: 13 %
NEUTROS PCT: 62 %
Neutro Abs: 4.3 10*3/uL (ref 1.7–7.7)
Platelets: 202 10*3/uL (ref 150–400)
RBC: 3.96 MIL/uL (ref 3.87–5.11)
RDW: 13.1 % (ref 11.5–15.5)
WBC: 7 10*3/uL (ref 4.0–10.5)

## 2016-05-15 LAB — I-STAT TROPONIN, ED: TROPONIN I, POC: 0.01 ng/mL (ref 0.00–0.08)

## 2016-05-15 LAB — URINALYSIS, ROUTINE W REFLEX MICROSCOPIC
BILIRUBIN URINE: NEGATIVE
Glucose, UA: NEGATIVE mg/dL
Ketones, ur: NEGATIVE mg/dL
Nitrite: NEGATIVE
Protein, ur: NEGATIVE mg/dL
SPECIFIC GRAVITY, URINE: 1.01 (ref 1.005–1.030)
pH: 6 (ref 5.0–8.0)

## 2016-05-15 LAB — URINE MICROSCOPIC-ADD ON

## 2016-05-15 LAB — LIPASE, BLOOD: Lipase: 36 U/L (ref 11–51)

## 2016-05-15 NOTE — ED Provider Notes (Signed)
I assumed care at signout Urinalysis negative Pt feels well No focal abd tenderness She had episodes of asymptomatic bradycardia   EKG Interpretation  Date/Time:  Wednesday May 15 2016 02:26:05 EDT Ventricular Rate:  47 PR Interval:    QRS Duration: 96 QT Interval:  443 QTC Calculation: 392 R Axis:   70 Text Interpretation:  Sinus bradycardia Consider left atrial enlargement Low voltage, precordial leads Borderline repolarization abnormality No significant change since last tracing Confirmed by Christy Gentles  MD, Elenore Rota ([id number]) on 05/15/2016 2:37:33 AM      Repeat EKG reveal sinus brady Pt ambulated without difficulty Denies recent syncope Will have her STOP metoprolol F/u with PCP in one week for recheck Pt agreeable    Ripley Fraise, MD 05/15/16 [phone]

## 2016-05-15 NOTE — Discharge Instructions (Signed)

## 2016-05-15 NOTE — Telephone Encounter (Signed)
Patient called to let us know she went to ER last night with abdominal pain and they advised her to stop her metoprolol because her BP and pulse were low. Wants to let MMM know and see if this is ok. MMM advised for patient to start taking 1/2 tab daily instead of stopping all together. I advised patient to keep track of BP and pulse and to contact the office in 2 weeks and let us know how the numbers are. Patient verbalized understanding

## 2016-07-03 ENCOUNTER — Other Ambulatory Visit: Payer: Self-pay | Admitting: Nurse Practitioner

## 2016-07-03 DIAGNOSIS — Z7901 Long term (current) use of anticoagulants: Secondary | ICD-10-CM

## 2016-07-25 ENCOUNTER — Ambulatory Visit (INDEPENDENT_AMBULATORY_CARE_PROVIDER_SITE_OTHER): Payer: Medicare Other | Admitting: Nurse Practitioner

## 2016-07-25 ENCOUNTER — Encounter: Payer: Self-pay | Admitting: Nurse Practitioner

## 2016-07-25 ENCOUNTER — Ambulatory Visit: Payer: Self-pay | Admitting: Nurse Practitioner

## 2016-07-25 VITALS — BP 138/63 | HR 60 | Temp 97.2°F | Ht 65.0 in | Wt 142.0 lb

## 2016-07-25 DIAGNOSIS — M810 Age-related osteoporosis without current pathological fracture: Secondary | ICD-10-CM | POA: Diagnosis not present

## 2016-07-25 DIAGNOSIS — I10 Essential (primary) hypertension: Secondary | ICD-10-CM | POA: Diagnosis not present

## 2016-07-25 DIAGNOSIS — E785 Hyperlipidemia, unspecified: Secondary | ICD-10-CM

## 2016-07-25 DIAGNOSIS — N184 Chronic kidney disease, stage 4 (severe): Secondary | ICD-10-CM | POA: Diagnosis not present

## 2016-07-25 DIAGNOSIS — I119 Hypertensive heart disease without heart failure: Secondary | ICD-10-CM | POA: Diagnosis not present

## 2016-07-25 DIAGNOSIS — Z23 Encounter for immunization: Secondary | ICD-10-CM | POA: Diagnosis not present

## 2016-07-25 DIAGNOSIS — M1A9XX0 Chronic gout, unspecified, without tophus (tophi): Secondary | ICD-10-CM | POA: Diagnosis not present

## 2016-07-25 DIAGNOSIS — I2510 Atherosclerotic heart disease of native coronary artery without angina pectoris: Secondary | ICD-10-CM

## 2016-07-25 DIAGNOSIS — I480 Paroxysmal atrial fibrillation: Secondary | ICD-10-CM

## 2016-07-25 MED ORDER — TRIAMTERENE-HCTZ 37.5-25 MG PO TABS: 1.0000 | ORAL_TABLET | Freq: Every day | ORAL | 5 refills | Status: DC

## 2016-07-25 MED ORDER — ATORVASTATIN CALCIUM 40 MG PO TABS: 40.0000 mg | ORAL_TABLET | Freq: Every evening | ORAL | 1 refills | Status: DC

## 2016-07-25 MED ORDER — AMLODIPINE BESYLATE 5 MG PO TABS: 5.0000 mg | ORAL_TABLET | Freq: Every day | ORAL | 1 refills | Status: DC

## 2016-07-25 MED ORDER — EZETIMIBE 10 MG PO TABS: 10.0000 mg | ORAL_TABLET | Freq: Every day | ORAL | 1 refills | Status: DC

## 2016-07-25 NOTE — Progress Notes (Signed)
Subjective:    Patient ID: Laura Jacobs, female    DOB: Jan 06, 1934, 82 y.o.   MRN: [id number]  Patient here today for follow up of chronic medical problems. No changes since last visit. No complaints today.   Outpatient Encounter Prescriptions as of 07/25/2016  Medication Sig  . amLODipine (NORVASC) 5 MG tablet Take 1 tablet (5 mg total) by mouth daily.  Marland Kitchen atorvastatin (LIPITOR) 40 MG tablet Take 1 tablet (40 mg total) by mouth every evening.  . calcium-vitamin D (OSCAL 500/200 D-3) 500-200 MG-UNIT per tablet Take 1 tablet by mouth 2 (two) times daily.  Marland Kitchen ELIQUIS 5 MG TABS tablet TAKE  (1)  TABLET TWICE A DAY.  Marland Kitchen ezetimibe (ZETIA) 10 MG tablet Take 1 tablet (10 mg total) by mouth daily.  Marland Kitchen NITROSTAT 0.4 MG SL tablet PLACE 1 TABLET UNDER THE TONGUE EVERY 5 MINUTES AS NEEDED FOR CHEST PA IN  . triamterene-hydrochlorothiazide (MAXZIDE-25) 37.5-25 MG tablet Take 1 tablet by mouth daily.  . [DISCONTINUED] amLODipine (NORVASC) 5 MG tablet Take 1 tablet (5 mg total) by mouth daily.  . [DISCONTINUED] atorvastatin (LIPITOR) 40 MG tablet Take 1 tablet (40 mg total) by mouth daily. (Patient taking differently: Take 40 mg by mouth every evening. )  . [DISCONTINUED] ezetimibe (ZETIA) 10 MG tablet Take 1 tablet (10 mg total) by mouth daily.  . [DISCONTINUED] triamterene-hydrochlorothiazide (MAXZIDE-25) 37.5-25 MG tablet Take 1 tablet by mouth daily.   No facility-administered encounter medications on file as of 07/25/2016.      Hypertension  This is a chronic problem. The current episode started more than 1 year ago. The problem is unchanged. The problem is controlled. Pertinent negatives include no blurred vision, chest pain, headaches, orthopnea, peripheral edema, shortness of breath or sweats. There are no associated agents to hypertension. Risk factors for coronary artery disease include dyslipidemia, post-menopausal state, sedentary lifestyle and smoking/tobacco exposure. Past treatments  include calcium channel blockers, beta blockers and diuretics. The current treatment provides significant improvement. Compliance problems include diet and exercise.  Hypertensive end-organ damage includes kidney disease and CAD/MI (MI in 1998). Identifiable causes of hypertension include chronic renal disease.  Hyperlipidemia  This is a chronic problem. The current episode started more than 1 year ago. The problem is uncontrolled. Recent lipid tests were reviewed and are high. Exacerbating diseases include chronic renal disease. She has no history of diabetes. Factors aggravating her hyperlipidemia include smoking and fatty foods. Pertinent negatives include no chest pain or shortness of breath. Current antihyperlipidemic treatment includes statins and ezetimibe. The current treatment provides moderate improvement of lipids. Compliance problems include adherence to diet and adherence to exercise.  Risk factors for coronary artery disease include hypertension, post-menopausal, a sedentary lifestyle and dyslipidemia.  GOUT Controlled with no current medications. CAD Bypass 1998- no need to use nitrogylcerin at home. Chronic kidney disease See nephrologist. Has missed past 2 appointments due to unforseen events. Last seen 3 months ago. Will be making an appointment soon.   Atrial fib/long term anticoagulant On eliquis- no recent palpitations- no bleeding Osteoporosis Taking oscal. Some weight bearing exercises prior to hurting back last Friday.  Back Pain Slipped on back porch in the rain. Not currently taking anything for it. Rates the pain 10/10 on a scale of 0-10. Afraid to take anything due to taking Eliquis.     Review of Systems  Eyes: Negative for blurred vision.  Respiratory: Negative for shortness of breath.   Cardiovascular: Negative for chest pain and orthopnea.  Musculoskeletal:  Positive for back pain.       Low back pain.  Neurological: Negative for headaches.  Hematological:  Bruises/bleeds easily.  All other systems reviewed and are negative.      Objective:   Physical Exam  Constitutional: She is oriented to person, place, and time. She appears well-developed and well-nourished.  HENT:  Head: Normocephalic.  Right Ear: External ear normal.  Left Ear: External ear normal.  Mouth/Throat: Oropharynx is clear and moist.  Eyes: Conjunctivae and EOM are normal. Pupils are equal, round, and reactive to light.  Neck: Trachea normal, normal range of motion and full passive range of motion without pain. Neck supple. No JVD present. Carotid bruit is not present. No thyromegaly present.  Cardiovascular: Normal rate, normal heart sounds and intact distal pulses.  Exam reveals no gallop and no friction rub.   No murmur heard. Pulmonary/Chest: Effort normal. She has decreased breath sounds.  Abdominal: Soft. Bowel sounds are normal. She exhibits no distension and no mass. There is no tenderness.  Musculoskeletal: Normal range of motion.  Lymphadenopathy:    She has no cervical adenopathy.  Neurological: She is alert and oriented to person, place, and time.  Moves slowly d/t low back pain.  Skin: Skin is warm and dry.  Psychiatric: She has a normal mood and affect. Her behavior is normal. Judgment and thought content normal.   BP 138/63   Pulse 60   Temp 97.2 F (36.2 C) (Oral)   Ht 5' 5"  (1.651 m)   Wt 142 lb (64.4 kg)   BMI 23.63 kg/m       Assessment & Plan:  1. Hypertensive heart disease  Do not add salt to diet.  2. Paroxysmal atrial fibrillation (HCC) Continue current medications. Monitor for excessive bleeding.  3. Coronary artery disease involving native coronary artery of native heart without angina pectoris Controlled  4. Age related osteoporosis, unspecified pathological fracture presence Continue OsCal. Weight bearing exercises as tolerated.  5. Chronic kidney disease (CKD), stage IV (severe) (HCC) Avoid Ibuprofen/Advil for back  pain.  6. Hyperlipidemia, unspecified hyperlipidemia type Avoid fatty foods. - atorvastatin (LIPITOR) 40 MG tablet; Take 1 tablet (40 mg total) by mouth every evening.  Dispense: 90 tablet; Refill: 1 - ezetimibe (ZETIA) 10 MG tablet; Take 1 tablet (10 mg total) by mouth daily.  Dispense: 90 tablet; Refill: 1 - Lipid panel  7. Chronic gout without tophus, unspecified cause, unspecified site Controlled on diet alone.  8. Essential hypertension Do not add salt to diet. - amLODipine (NORVASC) 5 MG tablet; Take 1 tablet (5 mg total) by mouth daily.  Dispense: 90 tablet; Refill: 1 - triamterene-hydrochlorothiazide (MAXZIDE-25) 37.5-25 MG tablet; Take 1 tablet by mouth daily.  Dispense: 30 tablet; Refill: 5 - CMP14+EGFR  9. Back pain May take Tylenol for pain.    Labs pending Health maintenance reviewed Diet and exercise encouraged Continue all meds Follow up  In 3 months  Hendricks Limes, BSN-RN/FNP student  [place], [place]

## 2016-07-25 NOTE — Patient Instructions (Signed)

## 2016-07-26 LAB — CMP14+EGFR
A/G RATIO: 1.5 (ref 1.2–2.2)
ALBUMIN: 4.5 g/dL (ref 3.5–4.7)
ALT: 7 IU/L (ref 0–32)
AST: 14 IU/L (ref 0–40)
Alkaline Phosphatase: 102 IU/L (ref 39–117)
BILIRUBIN TOTAL: 1 mg/dL (ref 0.0–1.2)
BUN / CREAT RATIO: 23 (ref 12–28)
BUN: 52 mg/dL — ABNORMAL HIGH (ref 8–27)
CALCIUM: 9.5 mg/dL (ref 8.7–10.3)
CHLORIDE: 96 mmol/L (ref 96–106)
CO2: 25 mmol/L (ref 18–29)
Creatinine, Ser: 2.22 mg/dL — ABNORMAL HIGH (ref 0.57–1.00)
GFR, EST AFRICAN AMERICAN: 23 mL/min/{1.73_m2} — AB (ref >59)
GFR, EST NON AFRICAN AMERICAN: 20 mL/min/{1.73_m2} — AB (ref >59)
GLOBULIN, TOTAL: 3 g/dL (ref 1.5–4.5)
Glucose: 98 mg/dL (ref 65–99)
POTASSIUM: 4.3 mmol/L (ref 3.5–5.2)
Sodium: 139 mmol/L (ref 134–144)
TOTAL PROTEIN: 7.5 g/dL (ref 6.0–8.5)

## 2016-07-26 LAB — LIPID PANEL
CHOL/HDL RATIO: 4.2 ratio (ref 0.0–4.4)
CHOLESTEROL TOTAL: 144 mg/dL (ref 100–199)
HDL: 34 mg/dL — AB (ref >39)
LDL Calculated: 86 mg/dL (ref 0–99)
TRIGLYCERIDES: 118 mg/dL (ref 0–149)
VLDL Cholesterol Cal: 24 mg/dL (ref 5–40)

## 2016-10-22 DIAGNOSIS — R0602 Shortness of breath: Secondary | ICD-10-CM | POA: Diagnosis not present

## 2016-10-22 DIAGNOSIS — E785 Hyperlipidemia, unspecified: Secondary | ICD-10-CM | POA: Diagnosis not present

## 2016-10-22 DIAGNOSIS — I2510 Atherosclerotic heart disease of native coronary artery without angina pectoris: Secondary | ICD-10-CM | POA: Diagnosis not present

## 2016-10-22 DIAGNOSIS — I480 Paroxysmal atrial fibrillation: Secondary | ICD-10-CM | POA: Diagnosis not present

## 2016-10-22 DIAGNOSIS — R065 Mouth breathing: Secondary | ICD-10-CM | POA: Diagnosis not present

## 2016-10-28 ENCOUNTER — Ambulatory Visit (INDEPENDENT_AMBULATORY_CARE_PROVIDER_SITE_OTHER): Payer: Medicare Other | Admitting: Nurse Practitioner

## 2016-10-28 ENCOUNTER — Encounter: Payer: Self-pay | Admitting: Nurse Practitioner

## 2016-10-28 VITALS — BP 131/71 | HR 87 | Temp 96.8°F | Ht 65.0 in | Wt 151.0 lb

## 2016-10-28 DIAGNOSIS — M1A9XX0 Chronic gout, unspecified, without tophus (tophi): Secondary | ICD-10-CM

## 2016-10-28 DIAGNOSIS — I480 Paroxysmal atrial fibrillation: Secondary | ICD-10-CM | POA: Diagnosis not present

## 2016-10-28 DIAGNOSIS — N184 Chronic kidney disease, stage 4 (severe): Secondary | ICD-10-CM

## 2016-10-28 DIAGNOSIS — Z23 Encounter for immunization: Secondary | ICD-10-CM

## 2016-10-28 DIAGNOSIS — I2510 Atherosclerotic heart disease of native coronary artery without angina pectoris: Secondary | ICD-10-CM | POA: Diagnosis not present

## 2016-10-28 DIAGNOSIS — Z7901 Long term (current) use of anticoagulants: Secondary | ICD-10-CM

## 2016-10-28 DIAGNOSIS — E785 Hyperlipidemia, unspecified: Secondary | ICD-10-CM | POA: Diagnosis not present

## 2016-10-28 DIAGNOSIS — I119 Hypertensive heart disease without heart failure: Secondary | ICD-10-CM

## 2016-10-28 DIAGNOSIS — R600 Localized edema: Secondary | ICD-10-CM | POA: Diagnosis not present

## 2016-10-28 NOTE — Addendum Note (Signed)
Addended by: Rolena Infante on: 10/28/2016 05:10 PM   Modules accepted: Orders

## 2016-10-28 NOTE — Patient Instructions (Signed)
Edema  Edema is an abnormal buildup of fluids. It is more common in your legs and thighs. Painless swelling of the feet and ankles is more likely as a person ages. It also is common in looser skin, like around your eyes.  Follow these instructions at home:  ? Keep the affected body part above the level of the heart while lying down.  ? Do not sit still or stand for a long time.  ? Do not put anything right under your knees when you lie down.  ? Do not wear tight clothes on your upper legs.  ? Exercise your legs to help the puffiness (swelling) go down.  ? Wear elastic bandages or support stockings as told by your doctor.  ? A low-salt diet may help lessen the puffiness.  ? Only take medicine as told by your doctor.  Contact a doctor if:  ? Treatment is not working.  ? You have heart, liver, or kidney disease and notice that your skin looks puffy or shiny.  ? You have puffiness in your legs that does not get better when you raise your legs.  ? You have sudden weight gain for no reason.  Get help right away if:  ? You have shortness of breath or chest pain.  ? You cannot breathe when you lie down.  ? You have pain, redness, or warmth in the areas that are puffy.  ? You have heart, liver, or kidney disease and get edema all of a sudden.  ? You have a fever and your symptoms get worse all of a sudden.  This information is not intended to replace advice given to you by your health care provider. Make sure you discuss any questions you have with your health care provider.  Document Released: 03/11/2008 Document Revised: 02/29/2016 Document Reviewed: 07/16/2013  Elsevier Interactive Patient Education ? 2017 [employer]

## 2016-10-28 NOTE — Progress Notes (Signed)
Subjective:    Patient ID: Laura Jacobs, female    DOB: 10/02/1934, 83 y.o.   MRN: [id number]  Patient here today for follow up of chronic medical problems. No changes since last visit.   Outpatient Encounter Prescriptions as of 10/28/2016  Medication Sig  . amLODipine (NORVASC) 5 MG tablet Take 1 tablet (5 mg total) by mouth daily.  Marland Kitchen atorvastatin (LIPITOR) 40 MG tablet Take 1 tablet (40 mg total) by mouth every evening.  . calcium-vitamin D (OSCAL 500/200 D-3) 500-200 MG-UNIT per tablet Take 1 tablet by mouth 2 (two) times daily.  Marland Kitchen ELIQUIS 5 MG TABS tablet TAKE  (1)  TABLET TWICE A DAY.  Marland Kitchen ezetimibe (ZETIA) 10 MG tablet Take 1 tablet (10 mg total) by mouth daily.  Marland Kitchen NITROSTAT 0.4 MG SL tablet PLACE 1 TABLET UNDER THE TONGUE EVERY 5 MINUTES AS NEEDED FOR CHEST PA IN  . triamterene-hydrochlorothiazide (MAXZIDE-25) 37.5-25 MG tablet Take 1 tablet by mouth daily.   No facility-administered encounter medications on file as of 10/28/2016.    * woke up this morning with right foot swollen- does not hurt and denies any injury- just swollen.  * weight up 10 lbs from previous visit.  Hypertension  This is a chronic problem. The current episode started more than 1 year ago. The problem is unchanged. The problem is controlled. Pertinent negatives include no blurred vision, chest pain, headaches, orthopnea, peripheral edema, shortness of breath or sweats. There are no associated agents to hypertension. Risk factors for coronary artery disease include dyslipidemia, post-menopausal state, sedentary lifestyle and smoking/tobacco exposure. Past treatments include calcium channel blockers, beta blockers and diuretics. The current treatment provides significant improvement. Compliance problems include diet and exercise.  Hypertensive end-organ damage includes kidney disease and CAD/MI (MI in 1998). Identifiable causes of hypertension include chronic renal disease.  Hyperlipidemia  This is a chronic  problem. The current episode started more than 1 year ago. The problem is uncontrolled. Recent lipid tests were reviewed and are high. Exacerbating diseases include chronic renal disease. She has no history of diabetes. Factors aggravating her hyperlipidemia include smoking and fatty foods. Pertinent negatives include no chest pain or shortness of breath. Current antihyperlipidemic treatment includes statins and ezetimibe. The current treatment provides moderate improvement of lipids. Compliance problems include adherence to diet and adherence to exercise.  Risk factors for coronary artery disease include hypertension, post-menopausal, a sedentary lifestyle and dyslipidemia.  GOUT Controlled with no current medications. CAD Bypass 1998- no need to use nitrogylcerin at home. Chronic kidney disease See nephrologist. Has missed past 2 appointments due to unforseen events. Last seen 3 months ago. Will be making an appointment soon.   Atrial fib/long term anticoagulant On eliquis- no recent palpitations- no bleeding Osteoporosis Taking oscal. Some weight bearing exercises prior to hurting back last Friday.  Back Pain Slipped on back porch in the rain. Not currently taking anything for it. Rates the pain 10/10 on a scale of 0-10. Afraid to take anything due to taking Eliquis.     Review of Systems  Constitutional: Negative.   HENT: Negative.   Eyes: Negative.  Negative for blurred vision.  Respiratory: Negative for shortness of breath.   Cardiovascular: Positive for leg swelling (mostly right foot). Negative for chest pain and orthopnea.  Gastrointestinal: Negative.   Endocrine: Negative.   Genitourinary: Negative.   Musculoskeletal: Positive for back pain.       Low back pain.  Neurological: Negative for headaches.  Hematological: Bruises/bleeds easily.  Psychiatric/Behavioral:  Negative.   All other systems reviewed and are negative.      Objective:   Physical Exam  Constitutional: She  is oriented to person, place, and time. She appears well-developed and well-nourished.  HENT:  Head: Normocephalic.  Right Ear: External ear normal.  Left Ear: External ear normal.  Mouth/Throat: Oropharynx is clear and moist.  Eyes: Conjunctivae and EOM are normal. Pupils are equal, round, and reactive to light.  Neck: Trachea normal, normal range of motion and full passive range of motion without pain. Neck supple. No JVD present. Carotid bruit is not present. No thyromegaly present.  Cardiovascular: Normal rate, normal heart sounds and intact distal pulses.  Exam reveals no gallop and no friction rub.   No murmur heard. Pulmonary/Chest: Effort normal. She has decreased breath sounds. She has rales (fine crackles bil bases).  Abdominal: Soft. Bowel sounds are normal. She exhibits no distension and no mass. There is no tenderness.  Musculoskeletal: Normal range of motion. She exhibits edema (right foot).  Lymphadenopathy:    She has no cervical adenopathy.  Neurological: She is alert and oriented to person, place, and time.  Moves slowly d/t low back pain.  Skin: Skin is warm and dry.  Psychiatric: She has a normal mood and affect. Her behavior is normal. Judgment and thought content normal.   BP 131/71   Pulse 87   Temp (!) 96.8 F (36 C) (Oral)   Ht _0  (1.651 m)   Wt 151 lb (68.5 kg)   BMI 25.13 kg/m        Assessment & Plan:  1. Coronary artery disease involving native coronary artery of native heart without angina pectoris  2. Hypertensive heart disease  Low sodium diet - CMP14+EGFR - Brain natriuretic peptide  3. Paroxysmal atrial fibrillation (HCC) Continue eliquis  4. Chronic kidney disease (CKD), stage IV (severe) ([place]) Needs to follow up with nephrologist  5. Chronic gout without tophus, unspecified cause, unspecified site Low purine diet  6. Hyperlipidemia, unspecified hyperlipidemia type Low fat diet - Lipid panel  7. Long-term (current) use of  anticoagulants Watch for signs of bleeding and report immediately  8. Edema of right foot Elevate foot tonight and see if improves-  Want to wait on labs before changing any meds    Labs pending Health maintenance reviewed Diet and exercise encouraged Continue all meds Follow up  In 3 months   [place], FNP

## 2016-10-29 ENCOUNTER — Other Ambulatory Visit: Payer: Self-pay | Admitting: Nurse Practitioner

## 2016-10-29 LAB — CMP14+EGFR
ALT: 9 IU/L (ref 0–32)
AST: 17 IU/L (ref 0–40)
Albumin/Globulin Ratio: 1.4 (ref 1.2–2.2)
Albumin: 4.2 g/dL (ref 3.5–4.7)
Alkaline Phosphatase: 108 IU/L (ref 39–117)
BUN/Creatinine Ratio: 18 (ref 12–28)
BUN: 27 mg/dL (ref 8–27)
Bilirubin Total: 0.7 mg/dL (ref 0.0–1.2)
CALCIUM: 9 mg/dL (ref 8.7–10.3)
CO2: 27 mmol/L (ref 18–29)
CREATININE: 1.5 mg/dL — AB (ref 0.57–1.00)
Chloride: 99 mmol/L (ref 96–106)
GFR calc Af Amer: 38 mL/min/{1.73_m2} — ABNORMAL LOW (ref >59)
GFR, EST NON AFRICAN AMERICAN: 33 mL/min/{1.73_m2} — AB (ref >59)
GLOBULIN, TOTAL: 2.9 g/dL (ref 1.5–4.5)
Glucose: 82 mg/dL (ref 65–99)
Potassium: 3.6 mmol/L (ref 3.5–5.2)
SODIUM: 142 mmol/L (ref 134–144)
TOTAL PROTEIN: 7.1 g/dL (ref 6.0–8.5)

## 2016-10-29 LAB — BRAIN NATRIURETIC PEPTIDE: BNP: 663.7 pg/mL — ABNORMAL HIGH (ref 0.0–100.0)

## 2016-10-29 LAB — LIPID PANEL
CHOL/HDL RATIO: 5.5 ratio — AB (ref 0.0–4.4)
Cholesterol, Total: 209 mg/dL — ABNORMAL HIGH (ref 100–199)
HDL: 38 mg/dL — AB (ref >39)
LDL Calculated: 142 mg/dL — ABNORMAL HIGH (ref 0–99)
TRIGLYCERIDES: 145 mg/dL (ref 0–149)
VLDL Cholesterol Cal: 29 mg/dL (ref 5–40)

## 2016-10-29 MED ORDER — FUROSEMIDE 20 MG PO TABS: 20.0000 mg | ORAL_TABLET | Freq: Every day | ORAL | 5 refills | Status: DC

## 2016-10-29 MED ORDER — METOPROLOL TARTRATE 25 MG PO TABS: 25.0000 mg | ORAL_TABLET | Freq: Two times a day (BID) | ORAL | 5 refills | Status: DC

## 2016-11-29 ENCOUNTER — Encounter: Payer: Self-pay | Admitting: Nurse Practitioner

## 2016-11-29 ENCOUNTER — Ambulatory Visit (INDEPENDENT_AMBULATORY_CARE_PROVIDER_SITE_OTHER): Payer: Medicare Other | Admitting: Nurse Practitioner

## 2016-11-29 VITALS — BP 136/82 | HR 84 | Temp 96.6°F

## 2016-11-29 DIAGNOSIS — I5042 Chronic combined systolic (congestive) and diastolic (congestive) heart failure: Secondary | ICD-10-CM | POA: Diagnosis not present

## 2016-11-29 NOTE — Progress Notes (Signed)
   Subjective:    Patient ID: Laura Jacobs, female    DOB: Jul 25, 1934, 83 y.o.   MRN: [id number]  HPI Patient was seen on 10/27/16 for follow up. Laura Jacobs was having some SOB and edema- BNP came back at 663.7. We added lasix to meds and patient is here today for follow up. Laura Jacobs says Laura Jacobs as noticed that Laura Jacobs does not get as SOB and is breathing better at night when Laura Jacobs is sleeping.    Review of Systems  Constitutional: Negative.   HENT: Negative.   Respiratory: Positive for shortness of breath (some but not near as much). Negative for cough.   Cardiovascular: Positive for leg swelling.  Gastrointestinal: Negative.   Genitourinary: Negative.   Neurological: Negative.   Psychiatric/Behavioral: Negative.   All other systems reviewed and are negative.      Objective:   Physical Exam  Constitutional: Laura Jacobs appears well-developed and well-nourished. No distress.  Cardiovascular: Normal rate.   Pulmonary/Chest: Effort normal.  Diminished breath sounds on right- no more audible rales.  Abdominal: Soft.  Neurological: Laura Jacobs is alert.  Skin: Skin is warm.  Psychiatric: Laura Jacobs has a normal mood and affect. Her behavior is normal. Judgment and thought content normal.   BP 136/82   Pulse 84   Temp (!) 96.6 F (35.9 C) (Oral)      Assessment & Plan:  1. Chronic combined systolic and diastolic congestive heart failure (HCC) Limit fluid intake Continue lasix as rx BNP repeated today Follow up in 3 months  [place], FNP

## 2016-11-29 NOTE — Patient Instructions (Signed)
Heart Failure  Heart failure means your heart has trouble pumping blood. This makes it hard for your body to work well. Heart failure is usually a long-term (chronic) condition. You must take good care of yourself and follow your doctor's treatment plan.  HOME CARE   Take your heart medicine as told by your doctor.    Do not stop taking medicine unless your doctor tells you to.    Do not skip any dose of medicine.    Refill your medicines before they run out.    Take other medicines only as told by your doctor or pharmacist.   Stay active if told by your doctor. The elderly and people with severe heart failure should talk with a doctor about physical activity.   Eat heart-healthy foods. Choose foods that are without trans fat and are low in saturated fat, cholesterol, and salt (sodium). This includes fresh or frozen fruits and vegetables, fish, lean meats, fat-free or low-fat dairy foods, whole grains, and high-fiber foods. Lentils and dried peas and beans (legumes) are also good choices.   Limit salt if told by your doctor.   Cook in a healthy way. Roast, grill, broil, bake, poach, steam, or stir-fry foods.   Limit fluids as told by your doctor.   Weigh yourself every morning. Do this after you pee (urinate) and before you eat breakfast. Write down your weight to give to your doctor.   Take your blood pressure and write it down if your doctor tells you to.   Ask your doctor how to check your pulse. Check your pulse as told.   Lose weight if told by your doctor.   Stop smoking or chewing tobacco. Do not use gum or patches that help you quit without your doctor's approval.   Schedule and go to doctor visits as told.   Nonpregnant women should have no more than 1 drink a day. Men should have no more than 2 drinks a day. Talk to your doctor about drinking alcohol.   Stop illegal drug use.   Stay current with shots (immunizations).   Manage your health conditions as told by your doctor.   Learn to  manage your stress.   Rest when you are tired.   If it is really hot outside:    Avoid intense activities.    Use air conditioning or fans, or get in a cooler place.    Avoid caffeine and alcohol.    Wear loose-fitting, lightweight, and light-colored clothing.   If it is really cold outside:    Avoid intense activities.    Layer your clothing.    Wear mittens or gloves, a hat, and a scarf when going outside.    Avoid alcohol.   Learn about heart failure and get support as needed.   Get help to maintain or improve your quality of life and your ability to care for yourself as needed.  GET HELP IF:    You gain weight quickly.   You are more short of breath than usual.   You cannot do your normal activities.   You tire easily.   You cough more than normal, especially with activity.   You have any or more puffiness (swelling) in areas such as your hands, feet, ankles, or belly (abdomen).   You cannot sleep because it is hard to breathe.   You feel like your heart is beating fast (palpitations).   You get dizzy or light-headed when you stand up.  GET HELP   RIGHT AWAY IF:    You have trouble breathing.   There is a change in mental status, such as becoming less alert or not being able to focus.   You have chest pain or discomfort.   You faint.  MAKE SURE YOU:    Understand these instructions.   Will watch your condition.   Will get help right away if you are not doing well or get worse.     This information is not intended to replace advice given to you by your health care provider. Make sure you discuss any questions you have with your health care provider.     Document Released: 07/02/2008 Document Revised: 10/14/2014 Document Reviewed: 11/09/2012  Elsevier Interactive Patient Education 2017 Elsevier Inc.

## 2016-11-30 ENCOUNTER — Other Ambulatory Visit: Payer: Self-pay | Admitting: Nurse Practitioner

## 2016-11-30 LAB — BRAIN NATRIURETIC PEPTIDE: BNP: 713.8 pg/mL — AB (ref 0.0–100.0)

## 2016-11-30 MED ORDER — FUROSEMIDE 40 MG PO TABS: 40.0000 mg | ORAL_TABLET | Freq: Every day | ORAL | 5 refills | Status: DC

## 2016-12-23 ENCOUNTER — Other Ambulatory Visit: Payer: Self-pay | Admitting: Nurse Practitioner

## 2016-12-23 DIAGNOSIS — Z7901 Long term (current) use of anticoagulants: Secondary | ICD-10-CM

## 2017-01-28 ENCOUNTER — Ambulatory Visit: Payer: Self-pay | Admitting: Nurse Practitioner

## 2017-01-29 ENCOUNTER — Encounter: Payer: Self-pay | Admitting: Nurse Practitioner

## 2017-02-11 ENCOUNTER — Encounter: Payer: Self-pay | Admitting: Nurse Practitioner

## 2017-02-11 ENCOUNTER — Ambulatory Visit (INDEPENDENT_AMBULATORY_CARE_PROVIDER_SITE_OTHER): Payer: Medicare Other | Admitting: Nurse Practitioner

## 2017-02-11 VITALS — BP 110/75 | HR 135 | Temp 96.8°F | Ht 63.0 in | Wt 143.0 lb

## 2017-02-11 DIAGNOSIS — I480 Paroxysmal atrial fibrillation: Secondary | ICD-10-CM

## 2017-02-11 DIAGNOSIS — I2510 Atherosclerotic heart disease of native coronary artery without angina pectoris: Secondary | ICD-10-CM | POA: Diagnosis not present

## 2017-02-11 DIAGNOSIS — N184 Chronic kidney disease, stage 4 (severe): Secondary | ICD-10-CM | POA: Diagnosis not present

## 2017-02-11 DIAGNOSIS — M810 Age-related osteoporosis without current pathological fracture: Secondary | ICD-10-CM

## 2017-02-11 DIAGNOSIS — I10 Essential (primary) hypertension: Secondary | ICD-10-CM | POA: Diagnosis not present

## 2017-02-11 DIAGNOSIS — I119 Hypertensive heart disease without heart failure: Secondary | ICD-10-CM | POA: Diagnosis not present

## 2017-02-11 DIAGNOSIS — E785 Hyperlipidemia, unspecified: Secondary | ICD-10-CM

## 2017-02-11 MED ORDER — EZETIMIBE 10 MG PO TABS: 10.0000 mg | ORAL_TABLET | Freq: Every day | ORAL | 1 refills | Status: DC

## 2017-02-11 MED ORDER — ATORVASTATIN CALCIUM 40 MG PO TABS: 40.0000 mg | ORAL_TABLET | Freq: Every evening | ORAL | 1 refills | Status: DC

## 2017-02-11 MED ORDER — METOPROLOL TARTRATE 25 MG PO TABS: 25.0000 mg | ORAL_TABLET | Freq: Two times a day (BID) | ORAL | 5 refills | Status: DC

## 2017-02-11 MED ORDER — FUROSEMIDE 40 MG PO TABS: 40.0000 mg | ORAL_TABLET | Freq: Every day | ORAL | 5 refills | Status: DC

## 2017-02-11 MED ORDER — APIXABAN 5 MG PO TABS: ORAL_TABLET | ORAL | 1 refills | Status: DC

## 2017-02-11 MED ORDER — AMLODIPINE BESYLATE 5 MG PO TABS: 5.0000 mg | ORAL_TABLET | Freq: Every day | ORAL | 1 refills | Status: DC

## 2017-02-11 NOTE — Patient Instructions (Signed)
Bone Health Bones protect organs, store calcium, and anchor muscles. Good health habits, such as eating nutritious foods and exercising regularly, are important for maintaining healthy bones. They can also help to prevent a condition that causes bones to lose density and become weak and brittle (osteoporosis). Why is bone mass important? Bone mass refers to the amount of bone tissue that you have. The higher your bone mass, the stronger your bones. An important step toward having healthy bones throughout life is to have strong and dense bones during childhood. A young adult who has a high bone mass is more likely to have a high bone mass later in life. Bone mass at its greatest it is called peak bone mass. A large decline in bone mass occurs in older adults. In women, it occurs about the time of menopause. During this time, it is important to practice good health habits, because if more bone is lost than what is replaced, the bones will become less healthy and more likely to break (fracture). If you find that you have a low bone mass, you may be able to prevent osteoporosis or further bone loss by changing your diet and lifestyle. How can I find out if my bone mass is low? Bone mass can be measured with an X-ray test that is called a bone mineral density (BMD) test. This test is recommended for all women who are age 65 or older. It may also be recommended for men who are age 70 or older, or for people who are more likely to develop osteoporosis due to:  Having bones that break easily.  Having a long-term disease that weakens bones, such as kidney disease or rheumatoid arthritis.  Having menopause earlier than normal.  Taking medicine that weakens bones, such as steroids, thyroid hormones, or hormone treatment for breast cancer or prostate cancer.  Smoking.  Drinking three or more alcoholic drinks each day. What are the nutritional recommendations for healthy bones? To have healthy bones, you need  to get enough of the right minerals and vitamins. Most nutrition experts recommend getting these nutrients from the foods that you eat. Nutritional recommendations vary from person to person. Ask your health care provider what is healthy for you. Here are some general guidelines. Calcium Recommendations  Calcium is the most important (essential) mineral for bone health. Most people can get enough calcium from their diet, but supplements may be recommended for people who are at risk for osteoporosis. Good sources of calcium include:  Dairy products, such as low-fat or nonfat milk, cheese, and yogurt.  Dark green leafy vegetables, such as bok choy and broccoli.  Calcium-fortified foods, such as orange juice, cereal, bread, soy beverages, and tofu products.  Nuts, such as almonds. Follow these recommended amounts for daily calcium intake:  Children, age 1?3: 700 mg.  Children, age 4?8: 1,000 mg.  Children, age 9?13: 1,300 mg.  Teens, age 14?18: 1,300 mg.  Adults, age 19?50: 1,000 mg.  Adults, age 51?70:  Men: 1,000 mg.  Women: 1,200 mg.  Adults, age 71 or older: 1,200 mg.  Pregnant and breastfeeding females:  Teens: 1,300 mg.  Adults: 1,000 mg. Vitamin D Recommendations  Vitamin D is the most essential vitamin for bone health. It helps the body to absorb calcium. Sunlight stimulates the skin to make vitamin D, so be sure to get enough sunlight. If you live in a cold climate or you do not get outside often, your health care provider may recommend that you take vitamin D   supplements. Good sources of vitamin D in your diet include:  Egg yolks.  Saltwater fish.  Milk and cereal fortified with vitamin D. Follow these recommended amounts for daily vitamin D intake:  Children and teens, age 1?18: 600 international units.  Adults, age 50 or younger: 400-800 international units.  Adults, age 51 or older: 800-1,000 international units. Other Nutrients  Other nutrients for bone  health include:  Phosphorus. This mineral is found in meat, poultry, dairy foods, nuts, and legumes. The recommended daily intake for adult men and adult women is 700 mg.  Magnesium. This mineral is found in seeds, nuts, dark green vegetables, and legumes. The recommended daily intake for adult men is 400?420 mg. For adult women, it is 310?320 mg.  Vitamin K. This vitamin is found in green leafy vegetables. The recommended daily intake is 120 mg for adult men and 90 mg for adult women. What type of physical activity is best for building and maintaining healthy bones? Weight-bearing and strength-building activities are important for building and maintaining peak bone mass. Weight-bearing activities cause muscles and bones to work against gravity. Strength-building activities increases muscle strength that supports bones. Weight-bearing and muscle-building activities include:  Walking and hiking.  Jogging and running.  Dancing.  Gym exercises.  Lifting weights.  Tennis and racquetball.  Climbing stairs.  Aerobics. Adults should get at least 30 minutes of moderate physical activity on most days. Children should get at least 60 minutes of moderate physical activity on most days. Ask your health care provide what type of exercise is best for you. Where can I find more information? For more information, check out the following websites:  National Osteoporosis Foundation: http://nof.org/learn/basics  National Institutes of Health: http://www.niams.nih.gov/Health_Info/Bone/Bone_Health/bone_health_for_life.asp This information is not intended to replace advice given to you by your health care provider. Make sure you discuss any questions you have with your health care provider. Document Released: 12/14/2003 Document Revised: 04/12/2016 Document Reviewed: 09/28/2014 Elsevier Interactive Patient Education  2017 Elsevier Inc.  

## 2017-02-11 NOTE — Progress Notes (Signed)
Subjective:    Patient ID: Darel Hong, female    DOB: 01-Feb-1934, 83 y.o.   MRN: [id number]  HPI   TORRI MICHALSKI is here today for follow up of chronic medical problem.  Outpatient Encounter Prescriptions as of 02/11/2017  Medication Sig  . amLODipine (NORVASC) 5 MG tablet Take 1 tablet (5 mg total) by mouth daily.  Marland Kitchen atorvastatin (LIPITOR) 40 MG tablet Take 1 tablet (40 mg total) by mouth every evening.  . calcium-vitamin D (OSCAL 500/200 D-3) 500-200 MG-UNIT per tablet Take 1 tablet by mouth 2 (two) times daily.  Marland Kitchen ELIQUIS 5 MG TABS tablet TAKE  (1)  TABLET TWICE A DAY.  Marland Kitchen ezetimibe (ZETIA) 10 MG tablet Take 1 tablet (10 mg total) by mouth daily.  . furosemide (LASIX) 40 MG tablet Take 1 tablet (40 mg total) by mouth daily.  . metoprolol tartrate (LOPRESSOR) 25 MG tablet Take 1 tablet (25 mg total) by mouth 2 (two) times daily.  Marland Kitchen NITROSTAT 0.4 MG SL tablet PLACE 1 TABLET UNDER THE TONGUE EVERY 5 MINUTES AS NEEDED FOR CHEST PA IN   No facility-administered encounter medications on file as of 02/11/2017.     1. Coronary artery disease involving native coronary artery of native heart without angina pectoris  No c/o chest pain  2. Hypertensive heart disease  No sob or HA   3. Paroxysmal atrial fibrillation (HCC)  No palpitations  4. Age related osteoporosis, unspecified pathological fracture presence Does very little exercise   5. Chronic kidney disease (CKD), stage IV (severe) (HCC)  Currently watching labs  6. Hyperlipidemia, unspecified hyperlipidemia type   does not watch diet    New complaints: None today    Review of Systems  Constitutional: Negative.  Negative for diaphoresis.  Eyes: Negative for pain.  Respiratory: Negative for shortness of breath.   Cardiovascular: Negative for chest pain, palpitations and leg swelling.  Gastrointestinal: Negative for abdominal pain.  Endocrine: Negative for polydipsia.  Skin: Negative for rash.  Neurological:  Negative for dizziness, weakness and headaches.  Hematological: Does not bruise/bleed easily.  All other systems reviewed and are negative.      Objective:   Physical Exam  Constitutional: She is oriented to person, place, and time. She appears well-developed and well-nourished.  HENT:  Nose: Nose normal.  Mouth/Throat: Oropharynx is clear and moist.  Eyes: EOM are normal.  Neck: Trachea normal, normal range of motion and full passive range of motion without pain. Neck supple. No JVD present. Carotid bruit is not present. No thyromegaly present.  Cardiovascular: Normal rate, regular rhythm, normal heart sounds and intact distal pulses.  Exam reveals no gallop and no friction rub.   No murmur heard. Pulmonary/Chest: Effort normal and breath sounds normal.  Abdominal: Soft. Bowel sounds are normal. She exhibits no distension and no mass. There is no tenderness.  Musculoskeletal: Normal range of motion.  Lymphadenopathy:    She has no cervical adenopathy.  Neurological: She is alert and oriented to person, place, and time. She has normal reflexes.  Skin: Skin is warm and dry.  Psychiatric: She has a normal mood and affect. Her behavior is normal. Judgment and thought content normal.   BP 110/75   Pulse (!) 135   Temp (!) 96.8 F (36 C) (Oral)   Ht 5' 3"  (1.6 m)   Wt 143 lb (64.9 kg)   BMI 25.33 kg/m       Assessment & Plan:  1. Coronary artery disease involving  native coronary artery of native heart without angina pectoris Continue eliquis - apixaban (ELIQUIS) 5 MG TABS tablet; 1po BID  Dispense: 180 tablet; Refill: 1  2. Hypertensive heart disease  Low sodium diet  3. Paroxysmal atrial fibrillation (HCC) avoid cafeine  4. Age related osteoporosis, unspecified pathological fracture presence Weight bearing exrercise  5. Chronic kidney disease (CKD), stage IV (severe) (HCC) Will continue to watch labs  6. Hyperlipidemia, unspecified hyperlipidemia type Low fat diet -  atorvastatin (LIPITOR) 40 MG tablet; Take 1 tablet (40 mg total) by mouth every evening.  Dispense: 90 tablet; Refill: 1 - ezetimibe (ZETIA) 10 MG tablet; Take 1 tablet (10 mg total) by mouth daily.  Dispense: 90 tablet; Refill: 1 - Lipid panel  7. Essential hypertension Low sodium diet - amLODipine (NORVASC) 5 MG tablet; Take 1 tablet (5 mg total) by mouth daily.  Dispense: 90 tablet; Refill: 1 - metoprolol tartrate (LOPRESSOR) 25 MG tablet; Take 1 tablet (25 mg total) by mouth 2 (two) times daily.  Dispense: 60 tablet; Refill: 5 - furosemide (LASIX) 40 MG tablet; Take 1 tablet (40 mg total) by mouth daily.  Dispense: 30 tablet; Refill: 5 - CMP14+EGFR    Labs pending Health maintenance reviewed Diet and exercise encouraged Continue all meds Follow up  In 3 months   [place], FNP

## 2017-02-12 LAB — CMP14+EGFR
ALBUMIN: 4.3 g/dL (ref 3.5–4.7)
ALT: 9 IU/L (ref 0–32)
AST: 16 IU/L (ref 0–40)
Albumin/Globulin Ratio: 1.5 (ref 1.2–2.2)
Alkaline Phosphatase: 106 IU/L (ref 39–117)
BILIRUBIN TOTAL: 0.8 mg/dL (ref 0.0–1.2)
BUN / CREAT RATIO: 13 (ref 12–28)
BUN: 29 mg/dL — AB (ref 8–27)
CALCIUM: 9.1 mg/dL (ref 8.7–10.3)
CO2: 25 mmol/L (ref 18–29)
Chloride: 93 mmol/L — ABNORMAL LOW (ref 96–106)
Creatinine, Ser: 2.16 mg/dL — ABNORMAL HIGH (ref 0.57–1.00)
GFR, EST AFRICAN AMERICAN: 24 mL/min/{1.73_m2} — AB (ref >59)
GFR, EST NON AFRICAN AMERICAN: 21 mL/min/{1.73_m2} — AB (ref >59)
Globulin, Total: 2.9 g/dL (ref 1.5–4.5)
Glucose: 87 mg/dL (ref 65–99)
POTASSIUM: 3.2 mmol/L — AB (ref 3.5–5.2)
Sodium: 140 mmol/L (ref 134–144)
TOTAL PROTEIN: 7.2 g/dL (ref 6.0–8.5)

## 2017-02-12 LAB — LIPID PANEL
CHOL/HDL RATIO: 4.6 ratio — AB (ref 0.0–4.4)
Cholesterol, Total: 161 mg/dL (ref 100–199)
HDL: 35 mg/dL — AB (ref >39)
LDL Calculated: 96 mg/dL (ref 0–99)
Triglycerides: 149 mg/dL (ref 0–149)
VLDL CHOLESTEROL CAL: 30 mg/dL (ref 5–40)

## 2017-02-13 ENCOUNTER — Other Ambulatory Visit: Payer: Self-pay | Admitting: Nurse Practitioner

## 2017-02-13 DIAGNOSIS — E876 Hypokalemia: Secondary | ICD-10-CM

## 2017-02-13 MED ORDER — POTASSIUM CHLORIDE ER 10 MEQ PO TBCR: 10.0000 meq | EXTENDED_RELEASE_TABLET | Freq: Every day | ORAL | 3 refills | Status: DC

## 2017-03-18 ENCOUNTER — Telehealth: Payer: Self-pay | Admitting: *Deleted

## 2017-03-18 NOTE — Telephone Encounter (Signed)
[place] with [facility] went out to do an AWV on Laura Jacobs on 03/17/2017 Laura Linton Rump did Dorene Ar testing for PAD Pt tested positive for PAD Results were as follows RLE 0.23 LLE 0.28  Pt has no complaints and is currently taking Eliquis Pt normally sees MMM

## 2017-03-19 NOTE — Telephone Encounter (Signed)
Called and informed patient that she will need to be seen.  Appt made to see MMM on 04/10/17 at 3pm

## 2017-03-19 NOTE — Telephone Encounter (Signed)
Needs appt to be seen to discuss

## 2017-04-10 ENCOUNTER — Ambulatory Visit: Payer: Self-pay | Admitting: Nurse Practitioner

## 2017-04-25 DIAGNOSIS — R0602 Shortness of breath: Secondary | ICD-10-CM | POA: Diagnosis not present

## 2017-04-25 DIAGNOSIS — I2510 Atherosclerotic heart disease of native coronary artery without angina pectoris: Secondary | ICD-10-CM | POA: Diagnosis not present

## 2017-06-17 ENCOUNTER — Ambulatory Visit (INDEPENDENT_AMBULATORY_CARE_PROVIDER_SITE_OTHER): Payer: Medicare Other | Admitting: Nurse Practitioner

## 2017-06-17 ENCOUNTER — Encounter: Payer: Self-pay | Admitting: Nurse Practitioner

## 2017-06-17 VITALS — BP 135/80 | HR 66 | Temp 96.8°F | Ht 63.0 in | Wt 131.0 lb

## 2017-06-17 DIAGNOSIS — R7989 Other specified abnormal findings of blood chemistry: Secondary | ICD-10-CM

## 2017-06-17 DIAGNOSIS — I480 Paroxysmal atrial fibrillation: Secondary | ICD-10-CM | POA: Diagnosis not present

## 2017-06-17 DIAGNOSIS — I10 Essential (primary) hypertension: Secondary | ICD-10-CM

## 2017-06-17 DIAGNOSIS — N184 Chronic kidney disease, stage 4 (severe): Secondary | ICD-10-CM

## 2017-06-17 DIAGNOSIS — E785 Hyperlipidemia, unspecified: Secondary | ICD-10-CM

## 2017-06-17 DIAGNOSIS — Z7901 Long term (current) use of anticoagulants: Secondary | ICD-10-CM

## 2017-06-17 DIAGNOSIS — I2510 Atherosclerotic heart disease of native coronary artery without angina pectoris: Secondary | ICD-10-CM

## 2017-06-17 DIAGNOSIS — M1A9XX0 Chronic gout, unspecified, without tophus (tophi): Secondary | ICD-10-CM

## 2017-06-17 DIAGNOSIS — E876 Hypokalemia: Secondary | ICD-10-CM

## 2017-06-17 DIAGNOSIS — M810 Age-related osteoporosis without current pathological fracture: Secondary | ICD-10-CM

## 2017-06-17 DIAGNOSIS — I119 Hypertensive heart disease without heart failure: Secondary | ICD-10-CM

## 2017-06-17 MED ORDER — EZETIMIBE 10 MG PO TABS: 10.0000 mg | ORAL_TABLET | Freq: Every day | ORAL | 1 refills | Status: DC

## 2017-06-17 MED ORDER — FUROSEMIDE 40 MG PO TABS: 40.0000 mg | ORAL_TABLET | Freq: Every day | ORAL | 5 refills | Status: DC

## 2017-06-17 MED ORDER — AMLODIPINE BESYLATE 5 MG PO TABS: 5.0000 mg | ORAL_TABLET | Freq: Every day | ORAL | 1 refills | Status: DC

## 2017-06-17 MED ORDER — APIXABAN 5 MG PO TABS: ORAL_TABLET | ORAL | 1 refills | Status: DC

## 2017-06-17 MED ORDER — METOPROLOL TARTRATE 25 MG PO TABS: 25.0000 mg | ORAL_TABLET | Freq: Two times a day (BID) | ORAL | 5 refills | Status: DC

## 2017-06-17 MED ORDER — POTASSIUM CHLORIDE ER 10 MEQ PO TBCR: 10.0000 meq | EXTENDED_RELEASE_TABLET | Freq: Every day | ORAL | 3 refills | Status: DC

## 2017-06-17 MED ORDER — ATORVASTATIN CALCIUM 40 MG PO TABS: 40.0000 mg | ORAL_TABLET | Freq: Every evening | ORAL | 1 refills | Status: DC

## 2017-06-17 NOTE — Addendum Note (Signed)
Addended by: Rolena Infante on: 06/17/2017 04:33 PM   Modules accepted: Orders

## 2017-06-17 NOTE — Progress Notes (Signed)
Subjective:    Patient ID: Laura Jacobs, female    DOB: [birth year], 83 y.o.   MRN: [id number]  HPI Laura Jacobs is here today for follow up of chronic medical problem.  Outpatient Encounter Prescriptions as of 06/17/2017  Medication Sig  . amLODipine (NORVASC) 5 MG tablet Take 1 tablet (5 mg total) by mouth daily.  Marland Kitchen apixaban (ELIQUIS) 5 MG TABS tablet 1po BID  . atorvastatin (LIPITOR) 40 MG tablet Take 1 tablet (40 mg total) by mouth every evening.  . calcium-vitamin D (OSCAL 500/200 D-3) 500-200 MG-UNIT per tablet Take 1 tablet by mouth 2 (two) times daily.  Marland Kitchen ezetimibe (ZETIA) 10 MG tablet Take 1 tablet (10 mg total) by mouth daily.  . furosemide (LASIX) 40 MG tablet Take 1 tablet (40 mg total) by mouth daily.  . metoprolol tartrate (LOPRESSOR) 25 MG tablet Take 1 tablet (25 mg total) by mouth 2 (two) times daily.  Marland Kitchen NITROSTAT 0.4 MG SL tablet PLACE 1 TABLET UNDER THE TONGUE EVERY 5 MINUTES AS NEEDED FOR CHEST PA IN  . potassium chloride (K-DUR) 10 MEQ tablet Take 1 tablet (10 mEq total) by mouth daily.   No facility-administered encounter medications on file as of 06/17/2017.     1. Hypertensive heart disease   Patient taking metoprolol, amlodipine, and Lasix for blood pressure management.  Patient does not check blood pressure at home.  2. Paroxysmal atrial fibrillation (HCC) Patient on Eliquis for thrombus prophylaxis.  No palpitations.  Patient followed by cardiology.  3. Age related osteoporosis, unspecified pathological fracture presence  Patient taking Oscal for supplementation.  4. Chronic kidney disease (CKD), stage IV (severe) (HCC)  Regular monitoring of kidney function.  Patient followed by nephrology.  5. Hyperlipidemia, unspecified hyperlipidemia type  Patient taking atorvastatin.  Regular LFT monitoring.  6. Chronic gout without tophus, unspecified cause, unspecified site  No current concern.  7. Long term current use of anticoagulant therapy    Patient continues to take Eliquis for atrial fibrillation.  8. Low serum vitamin D  Patient taking vitamin D supplement.    New complaints: None today.  Social history:    Review of Systems  Constitutional: Negative for activity change and appetite change.  Respiratory: Negative for cough.   Cardiovascular: Negative for chest pain and palpitations.  Neurological: Negative for dizziness, light-headedness and headaches.  All other systems reviewed and are negative.      Objective:   Physical Exam  Constitutional: She is oriented to person, place, and time. She appears well-developed and well-nourished. No distress.  HENT:  Head: Normocephalic.  Right Ear: External ear normal.  Left Ear: External ear normal.  Mouth/Throat: Oropharynx is clear and moist.  Eyes: Pupils are equal, round, and reactive to light.  Neck: Normal range of motion. Neck supple.  Cardiovascular: Normal rate, normal heart sounds and intact distal pulses.   No murmur heard. Pulmonary/Chest: Effort normal and breath sounds normal. No respiratory distress. She has no wheezes.  Abdominal: Soft. Bowel sounds are normal. She exhibits no distension. There is no tenderness.  Musculoskeletal: Normal range of motion. She exhibits no edema.  Lymphadenopathy:    She has no cervical adenopathy.  Neurological: She is alert and oriented to person, place, and time.  Skin: Skin is warm and dry.  Psychiatric: She has a normal mood and affect. Her behavior is normal.   BP 135/80   Pulse 66   Temp (!) 96.8 F (36 C) (Oral)   Ht 5\' 3"  (  1.6 m)   Wt 131 lb (59.4 kg)   BMI 23.21 kg/m         Assessment & Plan:  1. Hypertensive heart disease   2. Paroxysmal atrial fibrillation (HCC) Avoid caffeine  3. Age related osteoporosis, unspecified pathological fracture presence Weight bearing exercise  4. Chronic kidney disease (CKD), stage IV (severe) ([place]) Watching labs  5. Hyperlipidemia, unspecified  hyperlipidemia type Low fta diet - atorvastatin (LIPITOR) 40 MG tablet; Take 1 tablet (40 mg total) by mouth every evening.  Dispense: 90 tablet; Refill: 1 - ezetimibe (ZETIA) 10 MG tablet; Take 1 tablet (10 mg total) by mouth daily.  Dispense: 90 tablet; Refill: 1  6. Chronic gout without tophus, unspecified cause, unspecified site  7. Long term current use of anticoagulant therapy  8. Low serum vitamin D  9. Coronary artery disease involving native coronary artery of native heart without angina pectoris Keep follow up with cardiology - apixaban (ELIQUIS) 5 MG TABS tablet; 1po BID  Dispense: 180 tablet; Refill: 1  10. Essential hypertension Low sodium diet - amLODipine (NORVASC) 5 MG tablet; Take 1 tablet (5 mg total) by mouth daily.  Dispense: 90 tablet; Refill: 1 - furosemide (LASIX) 40 MG tablet; Take 1 tablet (40 mg total) by mouth daily.  Dispense: 30 tablet; Refill: 5 - metoprolol tartrate (LOPRESSOR) 25 MG tablet; Take 1 tablet (25 mg total) by mouth 2 (two) times daily.  Dispense: 60 tablet; Refill: 5  11. Hypokalemia - potassium chloride (K-DUR) 10 MEQ tablet; Take 1 tablet (10 mEq total) by mouth daily.  Dispense: 30 tablet; Refill: 3    Labs pending Health maintenance reviewed Diet and exercise encouraged Continue all meds Follow up  In 3 months   [place], FNP

## 2017-06-17 NOTE — Patient Instructions (Signed)

## 2017-06-18 ENCOUNTER — Other Ambulatory Visit: Payer: Self-pay | Admitting: Nurse Practitioner

## 2017-06-18 LAB — CMP14+EGFR
A/G RATIO: 1.4 (ref 1.2–2.2)
ALT: 8 IU/L (ref 0–32)
AST: 15 IU/L (ref 0–40)
Albumin: 4.2 g/dL (ref 3.5–4.7)
Alkaline Phosphatase: 93 IU/L (ref 39–117)
BUN / CREAT RATIO: 15 (ref 12–28)
BUN: 32 mg/dL — ABNORMAL HIGH (ref 8–27)
Bilirubin Total: 0.9 mg/dL (ref 0.0–1.2)
CALCIUM: 9.2 mg/dL (ref 8.7–10.3)
CO2: 26 mmol/L (ref 20–29)
Chloride: 92 mmol/L — ABNORMAL LOW (ref 96–106)
Creatinine, Ser: 2.14 mg/dL — ABNORMAL HIGH (ref 0.57–1.00)
GFR, EST AFRICAN AMERICAN: 24 mL/min/{1.73_m2} — AB (ref >59)
GFR, EST NON AFRICAN AMERICAN: 21 mL/min/{1.73_m2} — AB (ref >59)
GLOBULIN, TOTAL: 3.1 g/dL (ref 1.5–4.5)
GLUCOSE: 79 mg/dL (ref 65–99)
POTASSIUM: 3 mmol/L — AB (ref 3.5–5.2)
SODIUM: 139 mmol/L (ref 134–144)
TOTAL PROTEIN: 7.3 g/dL (ref 6.0–8.5)

## 2017-06-18 LAB — LIPID PANEL
CHOL/HDL RATIO: 4 ratio (ref 0.0–4.4)
Cholesterol, Total: 143 mg/dL (ref 100–199)
HDL: 36 mg/dL — ABNORMAL LOW (ref >39)
LDL Calculated: 81 mg/dL (ref 0–99)
Triglycerides: 130 mg/dL (ref 0–149)
VLDL Cholesterol Cal: 26 mg/dL (ref 5–40)

## 2017-06-18 MED ORDER — POTASSIUM CHLORIDE CRYS ER 20 MEQ PO TBCR: 20.0000 meq | EXTENDED_RELEASE_TABLET | Freq: Two times a day (BID) | ORAL | 5 refills | Status: DC

## 2017-09-08 ENCOUNTER — Ambulatory Visit (INDEPENDENT_AMBULATORY_CARE_PROVIDER_SITE_OTHER): Payer: Medicare Other

## 2017-09-08 ENCOUNTER — Telehealth: Payer: Self-pay | Admitting: Nurse Practitioner

## 2017-09-08 ENCOUNTER — Encounter: Payer: Self-pay | Admitting: Family Medicine

## 2017-09-08 ENCOUNTER — Ambulatory Visit (INDEPENDENT_AMBULATORY_CARE_PROVIDER_SITE_OTHER): Payer: Medicare Other | Admitting: Family Medicine

## 2017-09-08 VITALS — BP 139/86 | HR 94 | Temp 98.4°F | Ht 63.0 in | Wt 139.0 lb

## 2017-09-08 DIAGNOSIS — R0602 Shortness of breath: Secondary | ICD-10-CM

## 2017-09-08 DIAGNOSIS — Z23 Encounter for immunization: Secondary | ICD-10-CM

## 2017-09-08 NOTE — Patient Instructions (Signed)
Take 1 extra furosemide 40 mg for the next 3 days for a total of 2 a day for 3 days then resume normal dosing of one 40 mg furosemide daily

## 2017-09-08 NOTE — Telephone Encounter (Signed)
Advised patient she would need to come in to be seen and evaluated to see why she was having trouble breathing.  Appt made with Dr. Warrick Parisian today at 4:10pm

## 2017-09-08 NOTE — Progress Notes (Signed)
BP 139/86   Pulse 94   Temp 98.4 F (36.9 C) (Oral)   Ht 5\' 3"  (1.6 m)   Wt 139 lb (63 kg)   SpO2 97%   BMI 24.62 kg/m    Subjective:    Patient ID: Laura Jacobs, female    DOB: [birth year], 83 y.o.   MRN: [id number]  HPI: Laura Jacobs is a 83 y.o. female presenting on 09/08/2017 for Shortness of Breath (x 1 week, wakes up about 2-3 am every morning with shortness of breath)   HPI Shortness of breath/orthopnea Patient complains of shortness of breath that is been going on for the past 1 week.  She says she will wake up around 2 or 3 in the morning but feeling very short of breath and having trouble catching her breath and she has to sit up to finally catch her breath for a few minutes.  She denies any shortness of breath or wheezing or coughing throughout the day.  She denies any sinus pressure or congestion.  She does have a 50-pack-year smoking history but quit 4 years ago.  Relevant past medical, surgical, family and social history reviewed and updated as indicated. Interim medical history since our last visit reviewed. Allergies and medications reviewed and updated.  Review of Systems  Constitutional: Negative for chills and fever.  HENT: Negative for congestion, ear discharge, ear pain, sinus pressure and sinus pain.   Eyes: Negative for redness and visual disturbance.  Respiratory: Positive for shortness of breath. Negative for cough, chest tightness and wheezing.   Cardiovascular: Negative for chest pain and leg swelling.  Genitourinary: Negative for difficulty urinating and dysuria.  Musculoskeletal: Negative for back pain and gait problem.  Skin: Negative for rash.  Neurological: Negative for light-headedness and headaches.  Psychiatric/Behavioral: Negative for agitation and behavioral problems.  All other systems reviewed and are negative.   Per HPI unless specifically indicated above        Objective:    BP 139/86   Pulse 94   Temp 98.4 F  (36.9 C) (Oral)   Ht 5\' 3"  (1.6 m)   Wt 139 lb (63 kg)   SpO2 97%   BMI 24.62 kg/m   Wt Readings from Last 3 Encounters:  09/08/17 139 lb (63 kg)  06/17/17 131 lb (59.4 kg)  02/11/17 143 lb (64.9 kg)    Physical Exam  Constitutional: She is oriented to person, place, and time. She appears well-developed and well-nourished. No distress.  Eyes: Conjunctivae are normal.  Neck: Neck supple. No thyromegaly present.  Cardiovascular: Normal rate, regular rhythm, normal heart sounds and intact distal pulses.  No murmur heard. Pulmonary/Chest: Effort normal. No respiratory distress. She has no wheezes. She has no rhonchi. She has rales in the right lower field and the left lower field. She exhibits no tenderness.  Musculoskeletal: Normal range of motion. She exhibits edema (1+ edema in the right lower extremity, no calf tenderness, negative Homans sign).  Lymphadenopathy:    She has no cervical adenopathy.  Neurological: She is alert and oriented to person, place, and time. Coordination normal.  Skin: Skin is warm and dry. No rash noted. She is not diaphoretic.  Psychiatric: She has a normal mood and affect. Her behavior is normal.  Nursing note and vitals reviewed.   Chest x-ray: Fluid at bases of both lungs    Assessment & Plan:   Problem List Items Addressed This Visit    None    Visit Diagnoses  Shortness of breath    -  Primary   Concern for possible CHF, will send for echocardiogram and increase Lasix for the next 3 days   Relevant Orders   DG Chest 2 View   ECHOCARDIOGRAM COMPLETE   Encounter for immunization       Relevant Orders   Flu vaccine HIGH DOSE PF (Completed)     Increase Lasix dose over the next few days  Follow up plan: Return if symptoms worsen or fail to improve.  Counseling provided for all of the vaccine components Orders Placed This Encounter  Procedures  . DG Chest 2 View  . Flu vaccine HIGH DOSE PF    Caryl Pina, MD [facility] 09/08/2017, 4:43 PM

## 2017-09-16 ENCOUNTER — Inpatient Hospital Stay (HOSPITAL_COMMUNITY): Admission: RE | Admit: 2017-09-16 | Payer: Medicare Other | Source: Ambulatory Visit

## 2017-09-18 ENCOUNTER — Encounter: Payer: Self-pay | Admitting: Nurse Practitioner

## 2017-09-18 ENCOUNTER — Ambulatory Visit (INDEPENDENT_AMBULATORY_CARE_PROVIDER_SITE_OTHER): Payer: Medicare Other | Admitting: Nurse Practitioner

## 2017-09-18 VITALS — BP 134/72 | HR 95 | Temp 97.2°F | Ht 63.0 in | Wt 137.0 lb

## 2017-09-18 DIAGNOSIS — N184 Chronic kidney disease, stage 4 (severe): Secondary | ICD-10-CM

## 2017-09-18 DIAGNOSIS — M810 Age-related osteoporosis without current pathological fracture: Secondary | ICD-10-CM

## 2017-09-18 DIAGNOSIS — I119 Hypertensive heart disease without heart failure: Secondary | ICD-10-CM

## 2017-09-18 DIAGNOSIS — R7989 Other specified abnormal findings of blood chemistry: Secondary | ICD-10-CM

## 2017-09-18 DIAGNOSIS — I480 Paroxysmal atrial fibrillation: Secondary | ICD-10-CM

## 2017-09-18 DIAGNOSIS — M1A9XX0 Chronic gout, unspecified, without tophus (tophi): Secondary | ICD-10-CM | POA: Diagnosis not present

## 2017-09-18 DIAGNOSIS — Z7901 Long term (current) use of anticoagulants: Secondary | ICD-10-CM | POA: Diagnosis not present

## 2017-09-18 DIAGNOSIS — E785 Hyperlipidemia, unspecified: Secondary | ICD-10-CM

## 2017-09-18 DIAGNOSIS — I2510 Atherosclerotic heart disease of native coronary artery without angina pectoris: Secondary | ICD-10-CM | POA: Diagnosis not present

## 2017-09-18 NOTE — Progress Notes (Signed)
Subjective:    Patient ID: Laura Jacobs, female    DOB: 08/28/1934, 83 y.o.   MRN: [id number]  HPI  Laura Jacobs is here today for follow up of chronic medical problem.  Outpatient Encounter Medications as of 09/18/2017  Medication Sig  . amLODipine (NORVASC) 5 MG tablet Take 1 tablet (5 mg total) by mouth daily.  Marland Kitchen apixaban (ELIQUIS) 5 MG TABS tablet 1po BID  . atorvastatin (LIPITOR) 40 MG tablet Take 1 tablet (40 mg total) by mouth every evening.  . calcium-vitamin D (OSCAL 500/200 D-3) 500-200 MG-UNIT per tablet Take 1 tablet by mouth 2 (two) times daily.  Marland Kitchen ezetimibe (ZETIA) 10 MG tablet Take 1 tablet (10 mg total) by mouth daily.  . furosemide (LASIX) 40 MG tablet Take 1 tablet (40 mg total) by mouth daily.  . metoprolol tartrate (LOPRESSOR) 25 MG tablet Take 1 tablet (25 mg total) by mouth 2 (two) times daily.  Marland Kitchen NITROSTAT 0.4 MG SL tablet PLACE 1 TABLET UNDER THE TONGUE EVERY 5 MINUTES AS NEEDED FOR CHEST PA IN  . potassium chloride SA (KLOR-CON M20) 20 MEQ tablet Take 1 tablet (20 mEq total) by mouth 2 (two) times daily.     1. Hypertensive heart disease   No c/o chest pain, sob or headache. Does not check blood pressures at home.  2. Coronary artery disease involving native coronary artery of native heart without angina pectoris has no seen cardiology  3. Paroxysmal atrial fibrillation (HCC)  denies palpitations- is on eliquis with no side effects  4. Age related osteoporosis, unspecified pathological fracture presence No c/o back pain- dos very little exrcise   5. Chronic kidney disease (CKD), stage IV (severe) ([place])  Just watching labs- saw nephrology on 01/1817 no changes made  To plan of care.  6. Hyperlipidemia, unspecified hyperlipidemia type  Doe snot watch diet  7. 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 Long term current use of anticoagulant therapy  On eliquis-  No bleeding- does bruise  easy  9. Low serum vitamin D  Takes oscal daily- due for dexa scan    New complaints: none today  Social history: Lives with husband on >50 years   Review of Systems  Constitutional: Negative for activity change and appetite change.  HENT: Negative.   Eyes: Negative for pain.  Respiratory: Negative for shortness of breath.   Cardiovascular: Negative for chest pain, palpitations and leg swelling.  Gastrointestinal: Negative for abdominal pain.  Endocrine: Negative for polydipsia.  Genitourinary: Negative.   Skin: Negative for rash.  Neurological: Negative for dizziness, weakness and headaches.  Hematological: Does not bruise/bleed easily.  Psychiatric/Behavioral: Negative.   All other systems reviewed and are negative.      Objective:   Physical Exam  Constitutional: She is oriented to person, place, and time. She appears well-developed and well-nourished.  HENT:  Nose: Nose normal.  Mouth/Throat: Oropharynx is clear and moist.  Eyes: EOM are normal.  Neck: Trachea normal, normal range of motion and full passive range of motion without pain. Neck supple. No JVD present. Carotid bruit is not present. No thyromegaly present.  Cardiovascular: Normal rate, regular rhythm, normal heart sounds and intact distal pulses. Exam reveals no gallop and no friction rub.  No murmur heard. Pulmonary/Chest: Effort normal and breath sounds normal.  Abdominal: Soft. Bowel sounds are normal. She exhibits no distension and no mass. There is no tenderness.  Musculoskeletal: Normal range of  motion.  Lymphadenopathy:    She has no cervical adenopathy.  Neurological: She is alert and oriented to person, place, and time. She has normal reflexes.  Skin: Skin is warm and dry.  Psychiatric: She has a normal mood and affect. Her behavior is normal. Judgment and thought content normal.    BP 134/72   Pulse 95   Temp (!) 97.2 F (36.2 C) (Oral)   Ht 5' 3"  (1.6 m)   Wt 137 lb (62.1 kg)   BMI  24.27 kg/m         Assessment & Plan:  1. Hypertensive heart disease  Ow sodium diet - CMP14+EGFR  2. Coronary artery disease involving native coronary artery of native heart without angina pectoris Needs to see cardiology soon- refuses for now  3. Paroxysmal atrial fibrillation (HCC)  4. Age related osteoporosis, unspecified pathological fracture presence Weight bearing exercises encouraged Patient declined dexa scan  5. Chronic kidney disease (CKD), stage IV (severe) ([place]) Labs pending  6. Hyperlipidemia, unspecified hyperlipidemia type Low fat diet - Lipid panel  7. Chronic gout without tophus, unspecified cause, unspecified site  8. Long term current use of anticoagulant therapy  9. Low serum vitamin D Continue oscal daily    Labs pending Health maintenance reviewed Diet and exercise encouraged Continue all meds Follow up  In 3 months   [place], FNP

## 2017-09-18 NOTE — Patient Instructions (Signed)
Bone Health Bones protect organs, store calcium, and anchor muscles. Good health habits, such as eating nutritious foods and exercising regularly, are important for maintaining healthy bones. They can also help to prevent a condition that causes bones to lose density and become weak and brittle (osteoporosis). Why is bone mass important? Bone mass refers to the amount of bone tissue that you have. The higher your bone mass, the stronger your bones. An important step toward having healthy bones throughout life is to have strong and dense bones during childhood. A young adult who has a high bone mass is more likely to have a high bone mass later in life. Bone mass at its greatest it is called peak bone mass. A large decline in bone mass occurs in older adults. In women, it occurs about the time of menopause. During this time, it is important to practice good health habits, because if more bone is lost than what is replaced, the bones will become less healthy and more likely to break (fracture). If you find that you have a low bone mass, you may be able to prevent osteoporosis or further bone loss by changing your diet and lifestyle. How can I find out if my bone mass is low? Bone mass can be measured with an X-ray test that is called a bone mineral density (BMD) test. This test is recommended for all women who are age 65 or older. It may also be recommended for men who are age 70 or older, or for people who are more likely to develop osteoporosis due to:  Having bones that break easily.  Having a long-term disease that weakens bones, such as kidney disease or rheumatoid arthritis.  Having menopause earlier than normal.  Taking medicine that weakens bones, such as steroids, thyroid hormones, or hormone treatment for breast cancer or prostate cancer.  Smoking.  Drinking three or more alcoholic drinks each day.  What are the nutritional recommendations for healthy bones? To have healthy bones, you  need to get enough of the right minerals and vitamins. Most nutrition experts recommend getting these nutrients from the foods that you eat. Nutritional recommendations vary from person to person. Ask your health care provider what is healthy for you. Here are some general guidelines. Calcium Recommendations Calcium is the most important (essential) mineral for bone health. Most people can get enough calcium from their diet, but supplements may be recommended for people who are at risk for osteoporosis. Good sources of calcium include:  Dairy products, such as low-fat or nonfat milk, cheese, and yogurt.  Dark green leafy vegetables, such as bok choy and broccoli.  Calcium-fortified foods, such as orange juice, cereal, bread, soy beverages, and tofu products.  Nuts, such as almonds.  Follow these recommended amounts for daily calcium intake:  Children, age 1?3: 700 mg.  Children, age 4?8: 1,000 mg.  Children, age 9?13: 1,300 mg.  Teens, age 14?18: 1,300 mg.  Adults, age 19?50: 1,000 mg.  Adults, age 51?70: ? Men: 1,000 mg. ? Women: 1,200 mg.  Adults, age 71 or older: 1,200 mg.  Pregnant and breastfeeding females: ? Teens: 1,300 mg. ? Adults: 1,000 mg.  Vitamin D Recommendations Vitamin D is the most essential vitamin for bone health. It helps the body to absorb calcium. Sunlight stimulates the skin to make vitamin D, so be sure to get enough sunlight. If you live in a cold climate or you do not get outside often, your health care provider may recommend that you take vitamin   D supplements. Good sources of vitamin D in your diet include:  Egg yolks.  Saltwater fish.  Milk and cereal fortified with vitamin D.  Follow these recommended amounts for daily vitamin D intake:  Children and teens, age 1?18: 600 international units.  Adults, age 50 or younger: 400-800 international units.  Adults, age 51 or older: 800-1,000 international units.  Other Nutrients Other nutrients  for bone health include:  Phosphorus. This mineral is found in meat, poultry, dairy foods, nuts, and legumes. The recommended daily intake for adult men and adult women is 700 mg.  Magnesium. This mineral is found in seeds, nuts, dark green vegetables, and legumes. The recommended daily intake for adult men is 400?420 mg. For adult women, it is 310?320 mg.  Vitamin K. This vitamin is found in green leafy vegetables. The recommended daily intake is 120 mg for adult men and 90 mg for adult women.  What type of physical activity is best for building and maintaining healthy bones? Weight-bearing and strength-building activities are important for building and maintaining peak bone mass. Weight-bearing activities cause muscles and bones to work against gravity. Strength-building activities increases muscle strength that supports bones. Weight-bearing and muscle-building activities include:  Walking and hiking.  Jogging and running.  Dancing.  Gym exercises.  Lifting weights.  Tennis and racquetball.  Climbing stairs.  Aerobics.  Adults should get at least 30 minutes of moderate physical activity on most days. Children should get at least 60 minutes of moderate physical activity on most days. Ask your health care provide what type of exercise is best for you. Where can I find more information? For more information, check out the following websites:  National Osteoporosis Foundation: http://nof.org/learn/basics  National Institutes of Health: http://www.niams.nih.gov/Health_Info/Bone/Bone_Health/bone_health_for_life.asp  This information is not intended to replace advice given to you by your health care provider. Make sure you discuss any questions you have with your health care provider. Document Released: 12/14/2003 Document Revised: 04/12/2016 Document Reviewed: 09/28/2014 Elsevier Interactive Patient Education  2018 [employer]  

## 2017-09-19 LAB — LIPID PANEL
CHOLESTEROL TOTAL: 174 mg/dL (ref 100–199)
Chol/HDL Ratio: 3.7 ratio (ref 0.0–4.4)
HDL: 47 mg/dL (ref >39)
LDL CALC: 109 mg/dL — AB (ref 0–99)
Triglycerides: 89 mg/dL (ref 0–149)
VLDL Cholesterol Cal: 18 mg/dL (ref 5–40)

## 2017-09-19 LAB — CMP14+EGFR
ALBUMIN: 4.1 g/dL (ref 3.5–4.7)
ALK PHOS: 114 IU/L (ref 39–117)
ALT: 18 IU/L (ref 0–32)
AST: 27 IU/L (ref 0–40)
Albumin/Globulin Ratio: 1.5 (ref 1.2–2.2)
BUN / CREAT RATIO: 12 (ref 12–28)
BUN: 20 mg/dL (ref 8–27)
Bilirubin Total: 0.9 mg/dL (ref 0.0–1.2)
CO2: 25 mmol/L (ref 20–29)
CREATININE: 1.62 mg/dL — AB (ref 0.57–1.00)
Calcium: 8.9 mg/dL (ref 8.7–10.3)
Chloride: 101 mmol/L (ref 96–106)
GFR calc Af Amer: 34 mL/min/{1.73_m2} — ABNORMAL LOW (ref >59)
GFR, EST NON AFRICAN AMERICAN: 30 mL/min/{1.73_m2} — AB (ref >59)
GLOBULIN, TOTAL: 2.8 g/dL (ref 1.5–4.5)
Glucose: 83 mg/dL (ref 65–99)
Potassium: 3.8 mmol/L (ref 3.5–5.2)
SODIUM: 141 mmol/L (ref 134–144)
Total Protein: 6.9 g/dL (ref 6.0–8.5)

## 2017-12-18 ENCOUNTER — Ambulatory Visit: Payer: Self-pay | Admitting: Nurse Practitioner

## 2017-12-26 ENCOUNTER — Encounter: Payer: Self-pay | Admitting: Nurse Practitioner

## 2017-12-26 ENCOUNTER — Ambulatory Visit (INDEPENDENT_AMBULATORY_CARE_PROVIDER_SITE_OTHER): Payer: Medicare Other | Admitting: Nurse Practitioner

## 2017-12-26 VITALS — BP 116/76 | HR 87 | Temp 96.7°F | Ht 63.0 in | Wt 133.0 lb

## 2017-12-26 DIAGNOSIS — I10 Essential (primary) hypertension: Secondary | ICD-10-CM | POA: Diagnosis not present

## 2017-12-26 DIAGNOSIS — E785 Hyperlipidemia, unspecified: Secondary | ICD-10-CM | POA: Diagnosis not present

## 2017-12-26 DIAGNOSIS — I2510 Atherosclerotic heart disease of native coronary artery without angina pectoris: Secondary | ICD-10-CM | POA: Diagnosis not present

## 2017-12-26 DIAGNOSIS — R7989 Other specified abnormal findings of blood chemistry: Secondary | ICD-10-CM

## 2017-12-26 DIAGNOSIS — N184 Chronic kidney disease, stage 4 (severe): Secondary | ICD-10-CM

## 2017-12-26 DIAGNOSIS — Z7901 Long term (current) use of anticoagulants: Secondary | ICD-10-CM | POA: Diagnosis not present

## 2017-12-26 DIAGNOSIS — M1A9XX0 Chronic gout, unspecified, without tophus (tophi): Secondary | ICD-10-CM

## 2017-12-26 DIAGNOSIS — I480 Paroxysmal atrial fibrillation: Secondary | ICD-10-CM | POA: Diagnosis not present

## 2017-12-26 DIAGNOSIS — M810 Age-related osteoporosis without current pathological fracture: Secondary | ICD-10-CM | POA: Diagnosis not present

## 2017-12-26 DIAGNOSIS — I119 Hypertensive heart disease without heart failure: Secondary | ICD-10-CM

## 2017-12-26 MED ORDER — METOPROLOL TARTRATE 25 MG PO TABS: 25.0000 mg | ORAL_TABLET | Freq: Two times a day (BID) | ORAL | 5 refills | Status: DC

## 2017-12-26 MED ORDER — AMLODIPINE BESYLATE 5 MG PO TABS: 5.0000 mg | ORAL_TABLET | Freq: Every day | ORAL | 1 refills | Status: DC

## 2017-12-26 MED ORDER — POTASSIUM CHLORIDE CRYS ER 20 MEQ PO TBCR: 20.0000 meq | EXTENDED_RELEASE_TABLET | Freq: Two times a day (BID) | ORAL | 5 refills | Status: DC

## 2017-12-26 MED ORDER — ATORVASTATIN CALCIUM 40 MG PO TABS: 40.0000 mg | ORAL_TABLET | Freq: Every evening | ORAL | 1 refills | Status: DC

## 2017-12-26 MED ORDER — EZETIMIBE 10 MG PO TABS: 10.0000 mg | ORAL_TABLET | Freq: Every day | ORAL | 1 refills | Status: DC

## 2017-12-26 MED ORDER — APIXABAN 5 MG PO TABS: ORAL_TABLET | ORAL | 1 refills | Status: DC

## 2017-12-26 MED ORDER — FUROSEMIDE 40 MG PO TABS: 40.0000 mg | ORAL_TABLET | Freq: Every day | ORAL | 5 refills | Status: DC

## 2017-12-26 NOTE — Progress Notes (Signed)
Subjective:    Patient ID: Laura Jacobs, female    DOB: [birth year], 84 y.o.   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  Outpatient Encounter Medications as of 12/26/2017  Medication Sig  . amLODipine (NORVASC) 5 MG tablet Take 1 tablet (5 mg total) by mouth daily.  Marland Kitchen apixaban (ELIQUIS) 5 MG TABS tablet 1po BID  . atorvastatin (LIPITOR) 40 MG tablet Take 1 tablet (40 mg total) by mouth every evening.  . calcium-vitamin D (OSCAL 500/200 D-3) 500-200 MG-UNIT per tablet Take 1 tablet by mouth 2 (two) times daily.  Marland Kitchen ezetimibe (ZETIA) 10 MG tablet Take 1 tablet (10 mg total) by mouth daily.  . furosemide (LASIX) 40 MG tablet Take 1 tablet (40 mg total) by mouth daily.  . metoprolol tartrate (LOPRESSOR) 25 MG tablet Take 1 tablet (25 mg total) by mouth 2 (two) times daily.  Marland Kitchen NITROSTAT 0.4 MG SL tablet PLACE 1 TABLET UNDER THE TONGUE EVERY 5 MINUTES AS NEEDED FOR CHEST PA IN  . potassium chloride SA (KLOR-CON M20) 20 MEQ tablet Take 1 tablet (20 mEq total) by mouth 2 (two) times daily.     1. Coronary artery disease involving native coronary artery of native heart without angina pectoris patient has not seen cardiology in several years. She. was suppose to have an echocardiogram in December but patient no showed  2. Hypertensive heart disease   Does not c/o chest pain, increasing sob or headaches. She doe snot check blood pressures at home.  BP Readings from Last 3 Encounters:  09/18/17 134/72  09/08/17 139/86  06/17/17 135/80     3. Paroxysmal atrial fibrillation (HCC)  She is currently taking eliquis without any known side effects  4. Age related osteoporosis, unspecified pathological fracture presence  Last bone density was in 2016 and patient does not want o do anymore.  5. Chronic kidney disease (CKD), stage IV (severe) (HCC)  We are watching labs. She did see dr. Lowanda Foster in the past but not since 2017.  6.  Chronic gout without tophus, unspecified cause, unspecified site  No recent flare ups  7. Hyperlipidemia, unspecified hyperlipidemia type  Not watching diet at all  8. Long term current use of anticoagulant therapy  No side effects as stated earlier  8. Low serum vitamin D  Takes calcium and vitamin d supplement daily    New complaints: None today  Social history: Lives with husband and they are both in poor health and just take care of each other.   Review of Systems  Constitutional: Negative for activity change and appetite change.  HENT: Negative.   Eyes: Negative for pain.  Respiratory: Negative for shortness of breath.   Cardiovascular: Negative for chest pain, palpitations and leg swelling.  Gastrointestinal: Negative for abdominal pain.  Endocrine: Negative for polydipsia.  Genitourinary: Negative.   Musculoskeletal: Positive for arthralgias.  Skin: Negative for rash.  Neurological: Negative for dizziness, weakness and headaches.  Hematological: Does not bruise/bleed easily.  Psychiatric/Behavioral: Negative.   All other systems reviewed and are negative.      Objective:   Physical Exam  Constitutional: She is oriented to person, place, and time. She appears well-developed and well-nourished.  HENT:  Nose: Nose normal.  Mouth/Throat: Oropharynx is clear and moist.  Eyes: EOM are normal.  Neck: Trachea normal, normal range of motion and full passive range of motion without pain. Neck supple. No JVD present. Carotid bruit  is not present. No thyromegaly present.  Cardiovascular: Normal rate, regular rhythm, normal heart sounds and intact distal pulses. Exam reveals no gallop and no friction rub.  No murmur heard. Pulmonary/Chest: Effort normal and breath sounds normal.  Abdominal: Soft. Bowel sounds are normal. She exhibits no distension and no mass. There is no tenderness.  Musculoskeletal: Normal range of motion.  Lymphadenopathy:    She has no cervical  adenopathy.  Neurological: She is alert and oriented to person, place, and time. She has normal reflexes.  Skin: Skin is warm and dry.  Psychiatric: She has a normal mood and affect. Her behavior is normal. Judgment and thought content normal.    BP 116/76   Pulse 87   Temp (!) 96.7 F (35.9 C) (Oral)   Ht 5' 3"  (1.6 m)   Wt 133 lb (60.3 kg)   BMI 23.56 kg/m        Assessment & Plan:  1. Coronary artery disease involving native coronary artery of native heart without angina pectoris Does not want to see cardiology anymore - apixaban (ELIQUIS) 5 MG TABS tablet; 1po BID  Dispense: 180 tablet; Refill: 1  2. Hypertensive heart disease  Low sodium diet - CMP14+EGFR - amLODipine (NORVASC) 5 MG tablet; Take 1 tablet (5 mg total) by mouth daily.  Dispense: 90 tablet; Refill: 1 - furosemide (LASIX) 40 MG tablet; Take 1 tablet (40 mg total) by mouth daily.  Dispense: 30 tablet; Refill: 5 - metoprolol tartrate (LOPRESSOR) 25 MG tablet; Take 1 tablet (25 mg total) by mouth 2 (two) times daily.  Dispense: 60 tablet; Refill: 5   3. Paroxysmal atrial fibrillation (HCC) Avoid caffeine  4. Age related osteoporosis, unspecified pathological fracture presence Weight bearing exercise  5. Chronic kidney disease (CKD), stage IV (severe) ([facility]) Labs pending  6. 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 Hyperlipidemia, unspecified hyperlipidemia type Low fat diet - Lipid panel - atorvastatin (LIPITOR) 40 MG tablet; Take 1 tablet (40 mg total) by mouth every evening.  Dispense: 90 tablet; Refill: 1 - ezetimibe (ZETIA) 10 MG tablet; Take 1 tablet (10 mg total) by mouth daily.  Dispense: 90 tablet; Refill: 1  8. Long term current use of anticoagulant therapy  9. Low serum vitamin D    Labs pending Health maintenance reviewed Diet and exercise encouraged Continue all meds Follow up  In 56month   MWauna FNP

## 2017-12-27 LAB — LIPID PANEL
CHOLESTEROL TOTAL: 99 mg/dL — AB (ref 100–199)
Chol/HDL Ratio: 2.2 ratio (ref 0.0–4.4)
HDL: 45 mg/dL (ref >39)
LDL Calculated: 39 mg/dL (ref 0–99)
Triglycerides: 74 mg/dL (ref 0–149)
VLDL Cholesterol Cal: 15 mg/dL (ref 5–40)

## 2017-12-27 LAB — CMP14+EGFR
ALBUMIN: 4.3 g/dL (ref 3.5–4.7)
ALK PHOS: 101 IU/L (ref 39–117)
ALT: 26 IU/L (ref 0–32)
AST: 19 IU/L (ref 0–40)
Albumin/Globulin Ratio: 1.5 (ref 1.2–2.2)
BILIRUBIN TOTAL: 1.2 mg/dL (ref 0.0–1.2)
BUN / CREAT RATIO: 16 (ref 12–28)
BUN: 33 mg/dL — AB (ref 8–27)
CO2: 24 mmol/L (ref 20–29)
CREATININE: 2.04 mg/dL — AB (ref 0.57–1.00)
Calcium: 9.1 mg/dL (ref 8.7–10.3)
Chloride: 95 mmol/L — ABNORMAL LOW (ref 96–106)
GFR calc non Af Amer: 22 mL/min/{1.73_m2} — ABNORMAL LOW (ref >59)
GFR, EST AFRICAN AMERICAN: 26 mL/min/{1.73_m2} — AB (ref >59)
GLOBULIN, TOTAL: 2.8 g/dL (ref 1.5–4.5)
GLUCOSE: 93 mg/dL (ref 65–99)
Potassium: 3.5 mmol/L (ref 3.5–5.2)
SODIUM: 141 mmol/L (ref 134–144)
TOTAL PROTEIN: 7.1 g/dL (ref 6.0–8.5)

## 2018-08-03 ENCOUNTER — Ambulatory Visit: Payer: Self-pay | Admitting: Nurse Practitioner

## 2018-08-13 ENCOUNTER — Encounter: Payer: Self-pay | Admitting: Nurse Practitioner

## 2018-08-21 ENCOUNTER — Ambulatory Visit (INDEPENDENT_AMBULATORY_CARE_PROVIDER_SITE_OTHER): Payer: Medicare Other | Admitting: Nurse Practitioner

## 2018-08-21 ENCOUNTER — Encounter: Payer: Self-pay | Admitting: Nurse Practitioner

## 2018-08-21 VITALS — BP 124/80 | HR 116 | Temp 96.7°F | Ht 63.0 in | Wt 129.0 lb

## 2018-08-21 DIAGNOSIS — I480 Paroxysmal atrial fibrillation: Secondary | ICD-10-CM | POA: Diagnosis not present

## 2018-08-21 DIAGNOSIS — E785 Hyperlipidemia, unspecified: Secondary | ICD-10-CM | POA: Diagnosis not present

## 2018-08-21 DIAGNOSIS — I2510 Atherosclerotic heart disease of native coronary artery without angina pectoris: Secondary | ICD-10-CM | POA: Diagnosis not present

## 2018-08-21 DIAGNOSIS — M810 Age-related osteoporosis without current pathological fracture: Secondary | ICD-10-CM | POA: Diagnosis not present

## 2018-08-21 DIAGNOSIS — I10 Essential (primary) hypertension: Secondary | ICD-10-CM | POA: Diagnosis not present

## 2018-08-21 DIAGNOSIS — N184 Chronic kidney disease, stage 4 (severe): Secondary | ICD-10-CM

## 2018-08-21 DIAGNOSIS — Z7901 Long term (current) use of anticoagulants: Secondary | ICD-10-CM

## 2018-08-21 DIAGNOSIS — M1A9XX0 Chronic gout, unspecified, without tophus (tophi): Secondary | ICD-10-CM

## 2018-08-21 MED ORDER — ATORVASTATIN CALCIUM 40 MG PO TABS: 40.0000 mg | ORAL_TABLET | Freq: Every evening | ORAL | 1 refills | Status: DC

## 2018-08-21 MED ORDER — FUROSEMIDE 40 MG PO TABS: 40.0000 mg | ORAL_TABLET | Freq: Every day | ORAL | 5 refills | Status: DC

## 2018-08-21 MED ORDER — METOPROLOL TARTRATE 25 MG PO TABS: 25.0000 mg | ORAL_TABLET | Freq: Two times a day (BID) | ORAL | 5 refills | Status: DC

## 2018-08-21 MED ORDER — APIXABAN 5 MG PO TABS: ORAL_TABLET | ORAL | 1 refills | Status: DC

## 2018-08-21 MED ORDER — EZETIMIBE 10 MG PO TABS: 10.0000 mg | ORAL_TABLET | Freq: Every day | ORAL | 1 refills | Status: DC

## 2018-08-21 MED ORDER — POTASSIUM CHLORIDE CRYS ER 20 MEQ PO TBCR: 20.0000 meq | EXTENDED_RELEASE_TABLET | Freq: Two times a day (BID) | ORAL | 5 refills | Status: DC

## 2018-08-21 MED ORDER — AMLODIPINE BESYLATE 5 MG PO TABS: 5.0000 mg | ORAL_TABLET | Freq: Every day | ORAL | 1 refills | Status: DC

## 2018-08-21 NOTE — Patient Instructions (Signed)

## 2018-08-21 NOTE — Progress Notes (Signed)
Subjective:    Patient ID: Laura Jacobs, female    DOB: 09-Feb-1934, 84 y.o.   MRN: [id number]   Chief Complaint: medical management of chronic issues  HPI:  1. Hypertensive heart disease   no c/o chest pain, sob or headache. Does not check blood pressure at home. BP Readings from Last 3 Encounters:  08/21/18 124/80  12/26/17 116/76  09/18/17 134/72     2. Paroxysmal atrial fibrillation (HCC)  Denies any palpitations.  3. Coronary artery disease involving native coronary artery of native heart without angina pectoris  Patient has not seen cardiology in awhile and has no desire to see one right now  4. Age related osteoporosis, unspecified pathological fracture presence Last dexascan was 02/23/15. She does not want to do anymore of these.   5. Chronic kidney disease (CKD), stage IV (severe) ([facility])  Will repeat labs today  6. Hyperlipidemia, unspecified hyperlipidemia type  Does not watch diet but is also not a very good eater  7. Chronic gout without tophus, unspecified cause, unspecified site  No recent gout flare ups  8. Long term current use of anticoagulant therapy  She is eliquis and denies any breathing problems.    Outpatient Encounter Medications as of 08/21/2018  Medication Sig  . amLODipine (NORVASC) 5 MG tablet Take 1 tablet (5 mg total) by mouth daily.  Marland Kitchen apixaban (ELIQUIS) 5 MG TABS tablet 1po BID  . atorvastatin (LIPITOR) 40 MG tablet Take 1 tablet (40 mg total) by mouth every evening.  . calcium-vitamin D (OSCAL 500/200 D-3) 500-200 MG-UNIT per tablet Take 1 tablet by mouth 2 (two) times daily.  Marland Kitchen ezetimibe (ZETIA) 10 MG tablet Take 1 tablet (10 mg total) by mouth daily.  . furosemide (LASIX) 40 MG tablet Take 1 tablet (40 mg total) by mouth daily.  . metoprolol tartrate (LOPRESSOR) 25 MG tablet Take 1 tablet (25 mg total) by mouth 2 (two) times daily.  Marland Kitchen NITROSTAT 0.4 MG SL tablet PLACE 1 TABLET UNDER THE TONGUE EVERY 5 MINUTES AS NEEDED FOR CHEST PA  IN  . potassium chloride SA (KLOR-CON M20) 20 MEQ tablet Take 1 tablet (20 mEq total) by mouth 2 (two) times daily.      New complaints: None today  Social history: Lives with her husband that sh ehas to do a lot for.    Review of Systems  Constitutional: Negative for activity change and appetite change.  HENT: Negative.   Eyes: Negative for pain.  Respiratory: Negative for shortness of breath.   Cardiovascular: Negative for chest pain, palpitations and leg swelling.  Gastrointestinal: Negative for abdominal pain.  Endocrine: Negative for polydipsia.  Genitourinary: Negative.   Skin: Negative for rash.  Neurological: Negative for dizziness, weakness and headaches.  Hematological: Does not bruise/bleed easily.  Psychiatric/Behavioral: Negative.   All other systems reviewed and are negative.      Objective:   Physical Exam  Constitutional: She is oriented to person, place, and time. She appears well-developed and well-nourished. No distress.  HENT:  Head: Normocephalic.  Nose: Nose normal.  Mouth/Throat: Oropharynx is clear and moist.  Eyes: Pupils are equal, round, and reactive to light. EOM are normal.  Neck: Normal range of motion. Neck supple. No JVD present. Carotid bruit is not present.  Cardiovascular: Normal rate, normal heart sounds and intact distal pulses.  Heart rate irregular  Pulmonary/Chest: Effort normal and breath sounds normal. No respiratory distress. She has no wheezes. She has no rales. She exhibits no tenderness.  Abdominal: Soft. Normal appearance, normal aorta and bowel sounds are normal. She exhibits no distension, no abdominal bruit, no pulsatile midline mass and no mass. There is no splenomegaly or hepatomegaly. There is no tenderness.  Musculoskeletal: Normal range of motion. She exhibits no edema.  Lymphadenopathy:    She has no cervical adenopathy.  Neurological: She is alert and oriented to person, place, and time. She has normal reflexes.    Skin: Skin is warm and dry.  Psychiatric: She has a normal mood and affect. Her behavior is normal. Judgment and thought content normal.  Nursing note and vitals reviewed.   BP 124/80   Pulse (!) 116   Temp (!) 96.7 F (35.9 C) (Oral)   Ht 5\' 3"  (1.6 m)   Wt 129 lb (58.5 kg)   BMI 22.85 kg/m      Assessment & Plan:  Laura Jacobs comes in today with chief complaint of Medical Management of Chronic Issues   Diagnosis and orders addressed:  1. Paroxysmal atrial fibrillation (HCC) Needs to sees cardiology for follow up but refuses  2. Coronary artery disease involving native coronary artery of native heart without angina pectoris - apixaban (ELIQUIS) 5 MG TABS tablet; 1po BID  Dispense: 180 tablet; Refill: 1 - potassium chloride SA (KLOR-CON M20) 20 MEQ tablet; Take 1 tablet (20 mEq total) by mouth 2 (two) times daily.  Dispense: 30 tablet; Refill: 5  3. Age related osteoporosis, unspecified pathological fracture presence Weight bearing exercise if can toleratae  4. Chronic kidney disease (CKD), stage IV (severe) ([facility]) Labs pending  5. Hyperlipidemia, unspecified hyperlipidemia type Low fat diet - atorvastatin (LIPITOR) 40 MG tablet; Take 1 tablet (40 mg total) by mouth every evening.  Dispense: 90 tablet; Refill: 1 - ezetimibe (ZETIA) 10 MG tablet; Take 1 tablet (10 mg total) by mouth daily.  Dispense: 90 tablet; Refill: 1  6. Chronic gout without tophus, unspecified cause, unspecified site  7. Long term current use of anticoagulant therapy  8. Essential hypertension Low sodium diet - amLODipine (NORVASC) 5 MG tablet; Take 1 tablet (5 mg total) by mouth daily.  Dispense: 90 tablet; Refill: 1 - furosemide (LASIX) 40 MG tablet; Take 1 tablet (40 mg total) by mouth daily.  Dispense: 30 tablet; Refill: 5 - metoprolol tartrate (LOPRESSOR) 25 MG tablet; Take 1 tablet (25 mg total) by mouth 2 (two) times daily.  Dispense: 60 tablet; Refill: 5   Labs pending Health  Maintenance reviewed Diet and exercise encouraged  Follow up plan: 3 months   [name], FNP

## 2018-08-21 NOTE — Addendum Note (Signed)
Addended by: Terald Sleeper on: 08/21/2018 05:55 PM   Modules accepted: Orders

## 2018-08-23 LAB — CMP14+EGFR
ALT: 11 IU/L (ref 0–32)
AST: 14 IU/L (ref 0–40)
Albumin/Globulin Ratio: 1.6 (ref 1.2–2.2)
Albumin: 4.4 g/dL (ref 3.5–4.7)
Alkaline Phosphatase: 111 IU/L (ref 39–117)
BUN/Creatinine Ratio: 10 — ABNORMAL LOW (ref 12–28)
BUN: 15 mg/dL (ref 8–27)
Bilirubin Total: 1.1 mg/dL (ref 0.0–1.2)
CALCIUM: 9.4 mg/dL (ref 8.7–10.3)
CO2: 23 mmol/L (ref 20–29)
CREATININE: 1.43 mg/dL — AB (ref 0.57–1.00)
Chloride: 101 mmol/L (ref 96–106)
GFR calc Af Amer: 39 mL/min/{1.73_m2} — ABNORMAL LOW (ref >59)
GFR, EST NON AFRICAN AMERICAN: 34 mL/min/{1.73_m2} — AB (ref >59)
Globulin, Total: 2.7 g/dL (ref 1.5–4.5)
Glucose: 126 mg/dL — ABNORMAL HIGH (ref 65–99)
Potassium: 4.2 mmol/L (ref 3.5–5.2)
Sodium: 141 mmol/L (ref 134–144)
Total Protein: 7.1 g/dL (ref 6.0–8.5)

## 2018-08-23 LAB — LIPID PANEL
CHOL/HDL RATIO: 1.9 ratio (ref 0.0–4.4)
Cholesterol, Total: 104 mg/dL (ref 100–199)
HDL: 54 mg/dL (ref >39)
LDL CALC: 34 mg/dL (ref 0–99)
TRIGLYCERIDES: 80 mg/dL (ref 0–149)
VLDL Cholesterol Cal: 16 mg/dL (ref 5–40)

## 2018-11-26 ENCOUNTER — Ambulatory Visit: Payer: Self-pay | Admitting: Nurse Practitioner

## 2019-03-10 ENCOUNTER — Telehealth: Payer: Self-pay | Admitting: Nurse Practitioner

## 2019-03-24 ENCOUNTER — Other Ambulatory Visit: Payer: Self-pay | Admitting: Nurse Practitioner

## 2019-03-24 DIAGNOSIS — E785 Hyperlipidemia, unspecified: Secondary | ICD-10-CM

## 2019-03-24 DIAGNOSIS — I10 Essential (primary) hypertension: Secondary | ICD-10-CM

## 2019-03-24 DIAGNOSIS — I2510 Atherosclerotic heart disease of native coronary artery without angina pectoris: Secondary | ICD-10-CM

## 2019-04-01 ENCOUNTER — Ambulatory Visit (INDEPENDENT_AMBULATORY_CARE_PROVIDER_SITE_OTHER): Payer: Medicare Other | Admitting: *Deleted

## 2019-04-01 ENCOUNTER — Encounter: Payer: Self-pay | Admitting: *Deleted

## 2019-04-01 DIAGNOSIS — Z0000 Encounter for general adult medical examination without abnormal findings: Secondary | ICD-10-CM

## 2019-04-01 NOTE — Progress Notes (Addendum)
MEDICARE ANNUAL WELLNESS VISIT  04/01/2019  Telephone Visit Disclaimer This Medicare AWV was conducted by telephone due to national recommendations for restrictions regarding the COVID-19 Pandemic (e.g. social distancing).  I verified, using two identifiers, that I am speaking with Laura Jacobs or their authorized healthcare agent. I discussed the limitations, risks, security, and privacy concerns of performing an evaluation and management service by telephone and the potential availability of an in-person appointment in the future. The patient expressed understanding and agreed to proceed.   Subjective:  Laura Jacobs is a 85 y.o. female patient of [name], [employer] who had a Medicare Annual Wellness Visit today via telephone. Laura Jacobs is Retired and lives with their spouse. she has 6 children. she reports that she is socially active and does interact with friends/family regularly. she is minimally physically active and enjoys playing with her grandkids.  Patient Care Team: [name], FNP as PCP - General (Nurse Practitioner) Jacolyn Reedy, MD as Consulting Physician (Cardiology) Fran Lowes, MD as Consulting Physician (Nephrology) Okey Regal, [place] as Consulting Physician (Optometry)  Advanced Directives 04/01/2019 05/14/2016 02/27/2015 01/02/2015 08/22/2014  Does Patient Have a Medical Advance Directive? No No No No No  Would patient like information on creating a medical advance directive? No - Patient declined No - patient declined information Yes - Educational materials given No - patient declined information No - patient declined information    Hospital Utilization Over the Past 12 Months: # of hospitalizations or ER visits: 0 # of surgeries: 0  Review of Systems    Patient reports that her overall health is better compared to last year.  Patient Reported Readings (BP, Pulse, CBG, Weight, etc) none  Review of Systems: No  complaints  All other systems negative.  Pain Assessment Pain : No/denies pain     Current Medications & Allergies (verified) Allergies as of 04/01/2019   No Known Allergies     Medication List       Accurate as of April 01, 2019  3:14 PM. If you have any questions, ask your nurse or doctor.        amLODipine 5 MG tablet Commonly known as: NORVASC Take 1 tablet (5 mg total) by mouth daily.   atorvastatin 40 MG tablet Commonly known as: LIPITOR Take 1 tablet (40 mg total) by mouth every evening.   calcium-vitamin D 500-200 MG-UNIT tablet Commonly known as: Oscal 500/200 D-3 Take 1 tablet by mouth 2 (two) times daily.   Eliquis 5 MG Tabs tablet Generic drug: apixaban TAKE  (1)  TABLET TWICE A DAY.   ezetimibe 10 MG tablet Commonly known as: ZETIA Take 1 tablet (10 mg total) by mouth daily.   furosemide 40 MG tablet Commonly known as: LASIX Take 1 tablet (40 mg total) by mouth daily.   metoprolol tartrate 25 MG tablet Commonly known as: LOPRESSOR Take 1 tablet (25 mg total) by mouth 2 (two) times daily.   Nitrostat 0.4 MG SL tablet Generic drug: nitroGLYCERIN PLACE 1 TABLET UNDER THE TONGUE EVERY 5 MINUTES AS NEEDED FOR CHEST PA IN   potassium chloride SA 20 MEQ tablet Commonly known as: [place]-Con M20 Take 1 tablet (20 mEq total) by mouth 2 (two) times daily.       History (reviewed): Past Medical History:  Diagnosis Date  . Acute MI ([place])   . Atrial fibrillation, unspecified   . CAD (coronary artery disease)   . Gout   . Hyperlipidemia   . Hypertension  Past Surgical History:  Procedure Laterality Date  . heart by pass     Family History  Problem Relation Age of Onset  . Heart disease Mother   . Alcohol abuse Father   . Alzheimer's disease Sister   . Cancer Sister    Social History   Socioeconomic History  . Marital status: Married    Spouse name: Homer  . Number of children: 6  . Years of education: 8  . Highest education level:  8th grade  Occupational History  . Occupation: retired  Scientific laboratory technician  . Financial resource strain: Not hard at all  . Food insecurity    Worry: Never true    Inability: Never true  . Transportation needs    Medical: No    Non-medical: No  Tobacco Use  . Smoking status: Former Smoker    Packs/day: 0.50    Years: 60.00    Pack years: 30.00    Types: Cigarettes    Quit date: 08/07/2014    Years since quitting: 4.6  . Smokeless tobacco: Former Systems developer    Quit date: 05/11/2014  Substance and Sexual Activity  . Alcohol use: No  . Drug use: No  . Sexual activity: Yes    Birth control/protection: Post-menopausal  Lifestyle  . Physical activity    Days per week: 5 days    Minutes per session: 20 min  . Stress: Not at all  Relationships  . Social connections    Talks on phone: More than three times a week    Gets together: More than three times a week    Attends religious service: Never    Active member of club or organization: No    Attends meetings of clubs or organizations: Never    Relationship status: Married  Other Topics Concern  . Not on file  Social History Narrative  . Not on file    Activities of Daily Living In your present state of health, do you have any difficulty performing the following activities: 04/01/2019  Hearing? N  Vision? N  Difficulty concentrating or making decisions? N  Walking or climbing stairs? N  Dressing or bathing? N  Doing errands, shopping? N  Preparing Food and eating ? N  Using the Toilet? N  In the past six months, have you accidently leaked urine? Y  Comment rare  Do you have problems with loss of bowel control? N  Managing your Medications? N  Managing your Finances? N  Housekeeping or managing your Housekeeping? N  Some recent data might be hidden    Patient Literacy How often do you need to have someone help you when you read instructions, pamphlets, or other written materials from your doctor or pharmacy?: 1 - Never What is  the last grade level you completed in school?: 8th grade  Exercise Current Exercise Habits: Home exercise routine, Type of exercise: walking, Time (Minutes): 20, Frequency (Times/Week): 5, Weekly Exercise (Minutes/Week): 100, Intensity: Mild, Exercise limited by: None identified  Diet Patient reports consuming 2 meals a day and 1 snack(s) a day Patient reports that her primary diet is: Regular Patient reports that she does have regular access to food.   Depression Screen PHQ 2/9 Scores 04/01/2019 08/21/2018 12/26/2017 09/18/2017 09/08/2017 06/17/2017 11/29/2016  PHQ - 2 Score 0 0 0 0 0 0 0     Fall Risk Fall Risk  04/01/2019 08/21/2018 12/26/2017 09/18/2017 09/08/2017  Falls in the past year? 0 0 No No No  Number falls in past  yr: - - - - -  Injury with Fall? - - - - -  Comment - - - - -     Objective:  Laura Jacobs seemed alert and oriented and she participated appropriately during our telephone visit.  Blood Pressure Weight BMI  BP Readings from Last 3 Encounters:  08/21/18 124/80  12/26/17 116/76  09/18/17 134/72   Wt Readings from Last 3 Encounters:  08/21/18 129 lb (58.5 kg)  12/26/17 133 lb (60.3 kg)  09/18/17 137 lb (62.1 kg)   BMI Readings from Last 1 Encounters:  08/21/18 22.85 kg/m    *Unable to obtain current vital signs, weight, and BMI due to telephone visit type  Hearing/Vision  . Laura Jacobs did not seem to have difficulty with hearing/understanding during the telephone conversation . Reports that she has not had a formal eye exam by an eye care professional within the past year . Reports that she has not had a formal hearing evaluation within the past year *Unable to fully assess hearing and vision during telephone visit type  Cognitive Function: 6CIT Screen 04/01/2019  What Year? 0 points  What month? 0 points  What time? 0 points  Count back from 20 0 points  Months in reverse 4 points  Repeat phrase 2 points  Total Score 6   (Normal:0-7,  Significant for Dysfunction: >8)  Normal Cognitive Function Screening: Yes   Immunization & Health Maintenance Record Immunization History  Administered Date(s) Administered  . Influenza, High Dose Seasonal PF 07/07/2014, 07/25/2016, 09/08/2017  . Pneumococcal Conjugate-13 02/23/2015  . Pneumococcal Polysaccharide-23 10/28/2016    Health Maintenance  Topic Date Due  . MAMMOGRAM  03/31/1954  . TETANUS/TDAP  04/01/1955  . DEXA SCAN  02/22/2017  . INFLUENZA VACCINE  05/08/2019  . PNA vac Low Risk Adult  Completed       Assessment  This is a routine wellness examination for Laura Jacobs.  Health Maintenance: Due or Overdue Health Maintenance Due  Topic Date Due  . MAMMOGRAM  03/31/1954  . TETANUS/TDAP  04/01/1955  . DEXA SCAN  02/22/2017    Laura Jacobs does not need a referral for Community Assistance: Care Management:   no Social Work:    no Prescription Assistance:  no Nutrition/Diabetes Education:  no   Plan:  Personalized Goals Goals Addressed            This Visit's Progress   . Have 3 meals a day        Personalized Health Maintenance & Screening Recommendations  Td vaccine Screening mammography Bone densitometry screening Shingles vaccine  Lung Cancer Screening Recommended: no (Low Dose CT Chest recommended if Age 81-80 years, 30 pack-year currently smoking OR have quit w/in past 15 years) Hepatitis C Screening recommended: no HIV Screening recommended: no  Advanced Directives: Written information was not prepared per patient's request.  Referrals & Orders No orders of the defined types were placed in this encounter.   Follow-up Plan . Follow-up with [name], FNP as planned . Schedule your Screening Mammogram and your DEXA scan. . Consider TDAP and Shingles vaccines at your next visit with your PCP.   I have personally reviewed and noted the following in the patient's chart:   . Medical and social history  . Use of alcohol, tobacco or illicit drugs  . Current medications and supplements . Functional ability and status . Nutritional status . Physical activity . Advanced directives . List of other physicians . Hospitalizations, surgeries, and ER  visits in previous 12 months . Vitals . Screenings to include cognitive, depression, and falls . Referrals and appointments  In addition, I have reviewed and discussed with Laura Jacobs certain preventive protocols, quality metrics, and best practice recommendations. A written personalized care plan for preventive services as well as general preventive health recommendations is available and can be mailed to the patient at her request.      Marylin Crosby, LPN  0/27/2536  I have reviewed and agree with the above AWV documentation.   Evelina Dun, FNP

## 2019-04-01 NOTE — Patient Instructions (Signed)
Preventive Care 83 Years and Older, Female Preventive care refers to lifestyle choices and visits with your health care provider that can promote health and wellness. What does preventive care include?  A yearly physical exam. This is also called an annual well check.  Dental exams once or twice a year.  Routine eye exams. Ask your health care provider how often you should have your eyes checked.  Personal lifestyle choices, including: ? Daily care of your teeth and gums. ? Regular physical activity. ? Eating a healthy diet. ? Avoiding tobacco and drug use. ? Limiting alcohol use. ? Practicing safe sex. ? Taking low-dose aspirin every day. ? Taking vitamin and mineral supplements as recommended by your health care provider. What happens during an annual well check? The services and screenings done by your health care provider during your annual well check will depend on your age, overall health, lifestyle risk factors, and family history of disease. Counseling Your health care provider may ask you questions about your:  Alcohol use.  Tobacco use.  Drug use.  Emotional well-being.  Home and relationship well-being.  Sexual activity.  Eating habits.  History of falls.  Memory and ability to understand (cognition).  Work and work Statistician.  Reproductive health.  Screening You may have the following tests or measurements:  Height, weight, and BMI.  Blood pressure.  Lipid and cholesterol levels. These may be checked every 5 years, or more frequently if you are over 83 years old.  Skin check.  Lung cancer screening. You may have this screening every year starting at age 83 if you have a 30-pack-year history of smoking and currently smoke or have quit within the past 15 years.  Colorectal cancer screening. All adults should have this screening starting at age 83 and continuing until age 83. You will have tests every 1-10 years, depending on your results and the  type of screening test. People at increased risk should start screening at an earlier age. Screening tests may include: ? Guaiac-based fecal occult blood testing. ? Fecal immunochemical test (FIT). ? Stool DNA test. ? Virtual colonoscopy. ? Sigmoidoscopy. During this test, a flexible tube with a tiny camera (sigmoidoscope) is used to examine your rectum and lower colon. The sigmoidoscope is inserted through your anus into your rectum and lower colon. ? Colonoscopy. During this test, a long, thin, flexible tube with a tiny camera (colonoscope) is used to examine your entire colon and rectum.  Hepatitis C blood test.  Hepatitis B blood test.  Sexually transmitted disease (STD) testing.  Diabetes screening. This is done by checking your blood sugar (glucose) after you have not eaten for a while (fasting). You may have this done every 1-3 years.  Bone density scan. This is done to screen for osteoporosis. You may have this done starting at age 83.  Mammogram. This may be done every 1-2 years. Talk to your health care provider about how often you should have regular mammograms. Talk with your health care provider about your test results, treatment options, and if necessary, the need for more tests. Vaccines Your health care provider may recommend certain vaccines, such as:  Influenza vaccine. This is recommended every year.  Tetanus, diphtheria, and acellular pertussis (Tdap, Td) vaccine. You may need a Td booster every 10 years.  Varicella vaccine. You may need this if you have not been vaccinated.  Zoster vaccine. You may need this after age 83.  Measles, mumps, and rubella (MMR) vaccine. You may need at least  one dose of MMR if you were born in 1957 or later. You may also need a second dose.  Pneumococcal 13-valent conjugate (PCV13) vaccine. One dose is recommended after age 24.  Pneumococcal polysaccharide (PPSV23) vaccine. One dose is recommended after age 24.  Meningococcal  vaccine. You may need this if you have certain conditions.  Hepatitis A vaccine. You may need this if you have certain conditions or if you travel or work in places where you may be exposed to hepatitis A.  Hepatitis B vaccine. You may need this if you have certain conditions or if you travel or work in places where you may be exposed to hepatitis B.  Haemophilus influenzae type b (Hib) vaccine. You may need this if you have certain conditions. Talk to your health care provider about which screenings and vaccines you need and how often you need them. This information is not intended to replace advice given to you by your health care provider. Make sure you discuss any questions you have with your health care provider. Document Released: 10/20/2015 Document Revised: 11/13/2017 Document Reviewed: 07/25/2015 Elsevier Interactive Patient Education  2019 [employer].

## 2019-04-23 ENCOUNTER — Other Ambulatory Visit: Payer: Self-pay | Admitting: Nurse Practitioner

## 2019-04-23 DIAGNOSIS — I10 Essential (primary) hypertension: Secondary | ICD-10-CM

## 2019-05-21 ENCOUNTER — Other Ambulatory Visit: Payer: Self-pay | Admitting: Nurse Practitioner

## 2019-05-21 DIAGNOSIS — H25811 Combined forms of age-related cataract, right eye: Secondary | ICD-10-CM | POA: Diagnosis not present

## 2019-05-21 DIAGNOSIS — I10 Essential (primary) hypertension: Secondary | ICD-10-CM

## 2019-05-21 DIAGNOSIS — Z01818 Encounter for other preprocedural examination: Secondary | ICD-10-CM | POA: Diagnosis not present

## 2019-05-21 DIAGNOSIS — H2511 Age-related nuclear cataract, right eye: Secondary | ICD-10-CM | POA: Diagnosis not present

## 2019-05-21 DIAGNOSIS — H2512 Age-related nuclear cataract, left eye: Secondary | ICD-10-CM | POA: Diagnosis not present

## 2019-05-24 NOTE — Telephone Encounter (Signed)
appt made.  Patient aware  

## 2019-05-24 NOTE — Telephone Encounter (Signed)
MMM NTBS 30 days given 04/23/19

## 2019-05-27 ENCOUNTER — Other Ambulatory Visit: Payer: Self-pay

## 2019-05-28 ENCOUNTER — Ambulatory Visit (INDEPENDENT_AMBULATORY_CARE_PROVIDER_SITE_OTHER): Payer: Medicare Other | Admitting: Nurse Practitioner

## 2019-05-28 ENCOUNTER — Encounter: Payer: Self-pay | Admitting: Nurse Practitioner

## 2019-05-28 VITALS — BP 174/106 | HR 107 | Temp 96.8°F | Ht 63.0 in | Wt 129.0 lb

## 2019-05-28 DIAGNOSIS — I2510 Atherosclerotic heart disease of native coronary artery without angina pectoris: Secondary | ICD-10-CM

## 2019-05-28 DIAGNOSIS — L989 Disorder of the skin and subcutaneous tissue, unspecified: Secondary | ICD-10-CM

## 2019-05-28 DIAGNOSIS — E785 Hyperlipidemia, unspecified: Secondary | ICD-10-CM

## 2019-05-28 DIAGNOSIS — M810 Age-related osteoporosis without current pathological fracture: Secondary | ICD-10-CM | POA: Diagnosis not present

## 2019-05-28 DIAGNOSIS — I119 Hypertensive heart disease without heart failure: Secondary | ICD-10-CM | POA: Diagnosis not present

## 2019-05-28 DIAGNOSIS — Z7901 Long term (current) use of anticoagulants: Secondary | ICD-10-CM

## 2019-05-28 DIAGNOSIS — I480 Paroxysmal atrial fibrillation: Secondary | ICD-10-CM

## 2019-05-28 DIAGNOSIS — R7989 Other specified abnormal findings of blood chemistry: Secondary | ICD-10-CM

## 2019-05-28 DIAGNOSIS — N184 Chronic kidney disease, stage 4 (severe): Secondary | ICD-10-CM

## 2019-05-28 MED ORDER — FUROSEMIDE 40 MG PO TABS: 40.0000 mg | ORAL_TABLET | Freq: Every day | ORAL | 0 refills | Status: DC

## 2019-05-28 MED ORDER — METOPROLOL TARTRATE 25 MG PO TABS: 25.0000 mg | ORAL_TABLET | Freq: Two times a day (BID) | ORAL | 0 refills | Status: DC

## 2019-05-28 MED ORDER — APIXABAN 5 MG PO TABS: 5.0000 mg | ORAL_TABLET | Freq: Two times a day (BID) | ORAL | 1 refills | Status: DC

## 2019-05-28 MED ORDER — ATORVASTATIN CALCIUM 40 MG PO TABS: 40.0000 mg | ORAL_TABLET | Freq: Every evening | ORAL | 0 refills | Status: DC

## 2019-05-28 MED ORDER — AMLODIPINE BESYLATE 5 MG PO TABS: 5.0000 mg | ORAL_TABLET | Freq: Every day | ORAL | 0 refills | Status: DC

## 2019-05-28 MED ORDER — POTASSIUM CHLORIDE CRYS ER 20 MEQ PO TBCR: 20.0000 meq | EXTENDED_RELEASE_TABLET | Freq: Two times a day (BID) | ORAL | 5 refills | Status: DC

## 2019-05-28 MED ORDER — EZETIMIBE 10 MG PO TABS: 10.0000 mg | ORAL_TABLET | Freq: Every day | ORAL | 0 refills | Status: DC

## 2019-05-28 NOTE — Patient Instructions (Signed)
Basics of Medicine Management Taking your medicines correctly is an important part of managing or preventing medical problems. Make sure you know what disease or condition your medicine is treating, and how and when to take it. If you do not take your medicine correctly, it may not work well and may cause unpleasant side effects, including serious health problems. What should I do when I am taking medicines?   Read all the labels and inserts that come with your medicines. Review the information often.  Talk with your pharmacist if you get a refill and notice a change in the size, color, or shape of your medicines.  Know the potential side effects for each medicine that you take.  Try to get all your medicines from the same pharmacy. The pharmacist will have all your information and will understand how your medicines will affect each other (interact).  Tell your health care provider about all your medicines, including over-the-counter medicines, vitamins, and herbal or dietary supplements. He or she will make sure that nothing will interact with any of your prescribed medicines. How can I take my medicines safely?  Take medicines only as told by your health care provider. ? Do not take more of your medicine than instructed. ? Do not take anyone else's medicines. ? Do not share your medicines with others. ? Do not stop taking your medicines unless your health care provider tells you to do so. ? You may need to avoid alcohol or certain foods or liquids when taking certain medicines. Follow your health care provider's instructions.  Do not split, mash, or chew your medicines unless your health care provider tells you to do so. Tell your health care provider if you have trouble swallowing your medicines.  For liquid medicine, use the dosing container that was provided. How should I organize my medicines?  Know your medicines  Know what each of your medicines looks like. This includes size,  color, and shape. Tell your health care provider if you are having trouble recognizing all the medicines that you are taking.  If you cannot tell your medicines apart because they look similar, keep them in original bottles.  If you cannot read the labels on the bottles, tell your pharmacist to put your medicines in containers with large print.  Review your medicines and your schedule with family members, a friend, or a caregiver. Use a pill organizer  Use a tool to organize your medicine schedule. Tools include a weekly pillbox, a written chart, a notebook, or a calendar.  Your tool should help you remember the following things about each medicine: ? The name of the medicine. ? The amount (dose) to take. ? The schedule. This is the day and time the medicine should be taken. ? The appearance. This includes color, shape, size, and stamp. ? How to take your medicines. This includes instructions to take them with food, without food, with fluids, or with other medicines.  Create reminders for taking your medicines. Use sticky notes, or alarms on your watch, mobile device, or phone calendar.  You may choose to use a more advanced management system. These systems have storage, alarms, and visual and audio prompts.  Some medicines can be taken on an "as-needed" basis. These include medicines for nausea or pain. If you take an as-needed medicine, write down the name and dose, as well as the date and time that you took it. How should I plan for travel?  Take your pillbox, medicines, and organization system with   you when traveling.  Have your medicines refilled before you travel. This will ensure that you do not run out of your medicines while you are away from home.  Always carry an updated list of your medicines with you. If there is an emergency, a first responder can quickly see what medicines you are taking.  Do not pack your medicines in checked luggage in case your luggage is lost or  delayed.  If any of your medicines is considered a controlled substance, make sure you bring a letter from your health care provider with you. How should I store and discard my medicines? For safe storage:  Store medicines in a cool, dry area away from light, or as directed by your health care provider. Do not store medicines in the bathroom. Heat and humidity will affect them.  Do not store your medicines with other chemicals, or with medicines for pets or other household members.  Keep medicines away from children and pets. Do not leave them on counters or bedside tables. Store them in high cabinets or on high shelves. For safe disposal:  Check expiration dates regularly. Do not take expired medicines. Discard medicines that are older than the expiration date.  Learn a safe way to dispose of your medicines. You may: ? Use a local government, hospital, or pharmacy medicine-take-back program. ? Mix the medicines with inedible substances, put them in a sealed bag or empty container, and throw them in the trash. What should I remember?  Tell your health care provider if you: ? Experience side effects. ? Have new symptoms. ? Have other concerns about taking your medicines.  Review your medicines regularly with your health care provider. Other medicines, diet, medical conditions, weight changes, and daily habits can all affect how medicines work. Ask if you need to continue taking each medicine, and discuss how well each one is working.  Refill your medicines early to avoid running out of them.  In case of an accidental overdose, call your local Poison Control Center at 1-800-222-1222 or visit your local emergency department immediately. This is important. Summary  Taking your medicines correctly is an important part of managing or preventing medical problems.  You need to make sure that you understand what you are taking a medicine for, as well as how and when you need to take  it.  Know your medicines and use a pill organizer to help you take your medicines correctly.  In case of an accidental overdose, call your local Poison Control Center at 1-800-222-1222 or visit your local emergency department immediately. This is important. This information is not intended to replace advice given to you by your health care provider. Make sure you discuss any questions you have with your health care provider. Document Released: 01/08/2011 Document Revised: 09/18/2017 Document Reviewed: 09/18/2017 Elsevier Patient Education  2020 Else[employer] Inc[employer]  

## 2019-05-28 NOTE — Progress Notes (Signed)
Subjective:    Patient ID: Laura Jacobs, female    DOB: [birth year], 85 y.o.   MRN: [id number]   Chief Complaint: Medical Management of Chronic Issues    HPI:  1. Hypertensive heart disease  No c/o chest or headche. Does not check blood pressure at home. Has not taken her medicines today BP Readings from Last 3 Encounters:  08/21/18 124/80  12/26/17 116/76  09/18/17 134/72      2. Hyperlipidemia, unspecified hyperlipidemia type Does not watch diet and does mo exercise. I son daily dose of lipitor. Lab Results  Component Value Date   CHOL 104 08/21/2018   HDL 54 08/21/2018   LDLCALC 34 08/21/2018   TRIG 80 08/21/2018   CHOLHDL 1.9 08/21/2018     3. Coronary artery disease involving native coronary artery of native heart without angina pectoris Has not seen cardiology in several years. Does not want to go back right now.  4. Paroxysmal atrial fibrillation ([place]) Takes eliquis daily. Denies ay bleeding. Doe shave easy bruising.  5. Age related osteoporosis, unspecified pathological fracture presence Last dexascan was done in 2016. Has aged out to have repeated.  6. Chronic kidney disease (CKD), stage IV (severe) ([place]) Lab Results  Component Value Date   CREATININE 1.43 (H) 08/21/2018     7. Low serum vitamin D Takes daily vitamin d supplement  8. Long term current use of anticoagulant therapy No c/o bleeding    Outpatient Encounter Medications as of 05/28/2019  Medication Sig  . amLODipine (NORVASC) 5 MG tablet Take 1 tablet (5 mg total) by mouth daily.  Marland Kitchen atorvastatin (LIPITOR) 40 MG tablet Take 1 tablet (40 mg total) by mouth every evening.  . calcium-vitamin D (OSCAL 500/200 D-3) 500-200 MG-UNIT per tablet Take 1 tablet by mouth 2 (two) times daily.  Marland Kitchen ELIQUIS 5 MG TABS tablet TAKE  (1)  TABLET TWICE A DAY.  Marland Kitchen ezetimibe (ZETIA) 10 MG tablet Take 1 tablet (10 mg total) by mouth daily.  . furosemide (LASIX) 40 MG tablet Take 1 tablet (40 mg total)  by mouth daily.  . metoprolol tartrate (LOPRESSOR) 25 MG tablet Take 1 tablet (25 mg total) by mouth 2 (two) times daily.  Marland Kitchen NITROSTAT 0.4 MG SL tablet PLACE 1 TABLET UNDER THE TONGUE EVERY 5 MINUTES AS NEEDED FOR CHEST PA IN  . potassium chloride SA (KLOR-CON M20) 20 MEQ tablet Take 1 tablet (20 mEq total) by mouth 2 (two) times daily.     Past Surgical History:  Procedure Laterality Date  . heart by pass      Family History  Problem Relation Age of Onset  . Heart disease Mother   . Alcohol abuse Father   . Alzheimer's disease Sister   . Cancer Sister     New complaints: None today * she has not been taking her meds daily like she is suppose to. Social history: Lives with husband - her granddaughte rrecently had a baby and moved in with them  Controlled substance contract: n/a    Review of Systems  Constitutional: Negative for activity change and appetite change.  HENT: Negative.   Eyes: Negative for pain.  Respiratory: Negative for shortness of breath.   Cardiovascular: Negative for chest pain, palpitations and leg swelling.  Gastrointestinal: Negative for abdominal pain.  Endocrine: Negative for polydipsia.  Genitourinary: Negative.   Skin: Negative for rash.       Scalp lesion  Neurological: Negative for dizziness, weakness and headaches.  Hematological: Does  not bruise/bleed easily.  Psychiatric/Behavioral: Negative.   All other systems reviewed and are negative.      Objective:   Physical Exam Vitals signs and nursing note reviewed.  Constitutional:      General: She is not in acute distress.    Appearance: Normal appearance. She is well-developed.  HENT:     Head: Normocephalic.     Nose: Nose normal.  Eyes:     Pupils: Pupils are equal, round, and reactive to light.  Neck:     Musculoskeletal: Normal range of motion and neck supple.     Vascular: No carotid bruit or JVD.  Cardiovascular:     Rate and Rhythm: Normal rate and regular rhythm.      Heart sounds: Normal heart sounds.  Pulmonary:     Effort: Pulmonary effort is normal. No respiratory distress.     Breath sounds: Normal breath sounds. No wheezing or rales.  Chest:     Chest wall: No tenderness.  Abdominal:     General: Bowel sounds are normal. There is no distension or abdominal bruit.     Palpations: Abdomen is soft. There is no hepatomegaly, splenomegaly, mass or pulsatile mass.     Tenderness: There is no abdominal tenderness.  Musculoskeletal: Normal range of motion.  Lymphadenopathy:     Cervical: No cervical adenopathy.  Skin:    General: Skin is warm and dry.     Comments: Raised black seedy lesion on left forehead  Neurological:     Mental Status: She is alert and oriented to person, place, and time.     Deep Tendon Reflexes: Reflexes are normal and symmetric.  Psychiatric:        Behavior: Behavior normal.        Thought Content: Thought content normal.        Judgment: Judgment normal.       BP (!) 174/106   Pulse (!) 107   Temp (!) 96.8 F (36 C) (Temporal)   Ht _0  (1.6 m)   Wt 129 lb (58.5 kg)   BMI 22.85 kg/m        Assessment & Plan:  Laura Jacobs comes in today with chief complaint of Medical Management of Chronic Issues   Diagnosis and orders addressed:  1. Hypertensive heart disease  Low sodium diet Refuses to see cardiology - furosemide (LASIX) 40 MG tablet; Take 1 tablet (40 mg total) by mouth daily.  Dispense: 30 tablet; Refill: 0 - metoprolol tartrate (LOPRESSOR) 25 MG tablet; Take 1 tablet (25 mg total) by mouth 2 (two) times daily.  Dispense: 60 tablet; Refill: 0 - amLODipine (NORVASC) 5 MG tablet; Take 1 tablet (5 mg total) by mouth daily.  Dispense: 90 tablet; Refill: 0 - CMP14+EGFR  2. Hyperlipidemia, unspecified hyperlipidemia type Low fta diet - atorvastatin (LIPITOR) 40 MG tablet; Take 1 tablet (40 mg total) by mouth every evening.  Dispense: 90 tablet; Refill: 0 - ezetimibe (ZETIA) 10 MG tablet;  Take 1 tablet (10 mg total) by mouth daily.  Dispense: 90 tablet; Refill: 0 - Lipid panel  3. Coronary artery disease involving native coronary artery of native heart without angina pectoris - apixaban (ELIQUIS) 5 MG TABS tablet; Take 1 tablet (5 mg total) by mouth 2 (two) times daily.  Dispense: 180 tablet; Refill: 1 - potassium chloride SA (KLOR-CON M20) 20 MEQ tablet; Take 1 tablet (20 mEq total) by mouth 2 (two) times daily.  Dispense: 30 tablet; Refill: 5  4. Paroxysmal atrial  fibrillation ([place]) Continue eliquis  5. Age related osteoporosis, unspecified pathological fracture presence Weight bearing exercises  6. Chronic kidney disease (CKD), stage IV (severe) ([facility]) Labs oending  7. Low serum vitamin D Continue daily vitamin d  8. Long term current use of anticoagulant therapy  9. Benign skin lesion of forehead Referral to derm- may be cancerous - Ambulatory referral to Dermatology   Labs pending Health Maintenance reviewed Diet and exercise encouraged  Follow up plan: 3 months   [place], FNP

## 2019-06-01 ENCOUNTER — Other Ambulatory Visit: Payer: Self-pay | Admitting: Nurse Practitioner

## 2019-06-01 DIAGNOSIS — L989 Disorder of the skin and subcutaneous tissue, unspecified: Secondary | ICD-10-CM

## 2019-06-01 DIAGNOSIS — L821 Other seborrheic keratosis: Secondary | ICD-10-CM | POA: Diagnosis not present

## 2019-06-01 DIAGNOSIS — C44329 Squamous cell carcinoma of skin of other parts of face: Secondary | ICD-10-CM | POA: Diagnosis not present

## 2019-06-01 NOTE — Progress Notes (Signed)
Ref derm

## 2019-06-04 DIAGNOSIS — H2512 Age-related nuclear cataract, left eye: Secondary | ICD-10-CM | POA: Diagnosis not present

## 2019-06-04 DIAGNOSIS — H25812 Combined forms of age-related cataract, left eye: Secondary | ICD-10-CM | POA: Diagnosis not present

## 2019-06-21 ENCOUNTER — Other Ambulatory Visit: Payer: Self-pay | Admitting: Nurse Practitioner

## 2019-06-21 DIAGNOSIS — I119 Hypertensive heart disease without heart failure: Secondary | ICD-10-CM

## 2019-08-27 ENCOUNTER — Other Ambulatory Visit: Payer: Self-pay

## 2019-08-30 ENCOUNTER — Ambulatory Visit: Payer: Self-pay | Admitting: Nurse Practitioner

## 2019-09-16 ENCOUNTER — Other Ambulatory Visit: Payer: Self-pay | Admitting: Nurse Practitioner

## 2019-09-16 DIAGNOSIS — E785 Hyperlipidemia, unspecified: Secondary | ICD-10-CM

## 2019-09-16 DIAGNOSIS — I119 Hypertensive heart disease without heart failure: Secondary | ICD-10-CM

## 2019-09-20 ENCOUNTER — Other Ambulatory Visit: Payer: Self-pay

## 2019-09-21 ENCOUNTER — Encounter: Payer: Self-pay | Admitting: Nurse Practitioner

## 2019-09-21 ENCOUNTER — Ambulatory Visit (INDEPENDENT_AMBULATORY_CARE_PROVIDER_SITE_OTHER): Payer: Medicare Other | Admitting: Nurse Practitioner

## 2019-09-21 DIAGNOSIS — E785 Hyperlipidemia, unspecified: Secondary | ICD-10-CM

## 2019-09-21 DIAGNOSIS — N184 Chronic kidney disease, stage 4 (severe): Secondary | ICD-10-CM | POA: Diagnosis not present

## 2019-09-21 DIAGNOSIS — I2510 Atherosclerotic heart disease of native coronary artery without angina pectoris: Secondary | ICD-10-CM | POA: Diagnosis not present

## 2019-09-21 DIAGNOSIS — R911 Solitary pulmonary nodule: Secondary | ICD-10-CM

## 2019-09-21 DIAGNOSIS — R7989 Other specified abnormal findings of blood chemistry: Secondary | ICD-10-CM

## 2019-09-21 DIAGNOSIS — M1A9XX0 Chronic gout, unspecified, without tophus (tophi): Secondary | ICD-10-CM

## 2019-09-21 DIAGNOSIS — I480 Paroxysmal atrial fibrillation: Secondary | ICD-10-CM

## 2019-09-21 DIAGNOSIS — I119 Hypertensive heart disease without heart failure: Secondary | ICD-10-CM | POA: Diagnosis not present

## 2019-09-21 MED ORDER — FUROSEMIDE 40 MG PO TABS: 40.0000 mg | ORAL_TABLET | Freq: Every day | ORAL | 1 refills | Status: DC

## 2019-09-21 MED ORDER — APIXABAN 5 MG PO TABS: 5.0000 mg | ORAL_TABLET | Freq: Two times a day (BID) | ORAL | 1 refills | Status: DC

## 2019-09-21 MED ORDER — POTASSIUM CHLORIDE CRYS ER 20 MEQ PO TBCR: 20.0000 meq | EXTENDED_RELEASE_TABLET | Freq: Two times a day (BID) | ORAL | 5 refills | Status: DC

## 2019-09-21 MED ORDER — METOPROLOL TARTRATE 25 MG PO TABS: ORAL_TABLET | ORAL | 1 refills | Status: DC

## 2019-09-21 MED ORDER — AMLODIPINE BESYLATE 5 MG PO TABS: 5.0000 mg | ORAL_TABLET | Freq: Every day | ORAL | 1 refills | Status: DC

## 2019-09-21 MED ORDER — EZETIMIBE 10 MG PO TABS: 10.0000 mg | ORAL_TABLET | Freq: Every day | ORAL | 1 refills | Status: DC

## 2019-09-21 MED ORDER — ATORVASTATIN CALCIUM 40 MG PO TABS: ORAL_TABLET | ORAL | 1 refills | Status: DC

## 2019-09-21 NOTE — Progress Notes (Signed)
Virtual Visit via telephone Note Due to COVID-19 pandemic this visit was conducted virtually. This visit type was conducted due to national recommendations for restrictions regarding the COVID-19 Pandemic (e.g. social distancing, sheltering in place) in an effort to limit this patient's exposure and mitigate transmission in our community. All issues noted in this document were discussed and addressed.  A physical exam was not performed with this format.  I connected with Laura Jacobs on 09/21/19 at 11:30 by telephone and verified that I am speaking with the correct person using two identifiers. Laura Jacobs is currently located at home and her husband is currently with her during visit. The provider, [name], FNP is located in their office at time of visit.  I discussed the limitations, risks, security and privacy concerns of performing an evaluation and management service by telephone and the availability of in person appointments. I also discussed with the patient that there may be a patient responsible charge related to this service. The patient expressed understanding and agreed to proceed.   History and Present Illness:   Chief Complaint: Medical Management of Chronic Issues    HPI:  1. Hypertensive heart disease  No c/o chest pain, sob or headache. Does  check blood pressure at home runnig around [phone] systolic BP Readings from Last 3 Encounters:  05/28/19 (!) 174/106  08/21/18 124/80  12/26/17 116/76   2. Coronary artery disease involving native coronary artery of native heart without angina pectoris Has not seen cardiology on several years. Says he feels fine and does not want to go unless she has to.  3. Paroxysmal atrial fibrillation (HCC) Is on eliquis daily without any bleeding problems  4. Chronic kidney disease (CKD), stage IV (severe) (HCC) Denies any problems voiding- no major edema.  Lab Results  Component Value Date   CREATININE  1.43 (H) 08/21/2018     5. Hyperlipidemia, unspecified hyperlipidemia type Does not really watch diet and very seldom exercises. Lab Results  Component Value Date   CHOL 104 08/21/2018   HDL 54 08/21/2018   LDLCALC 34 08/21/2018   TRIG 80 08/21/2018   CHOLHDL 1.9 08/21/2018     6. Chronic gout without tophus, unspecified cause, unspecified site Denies any recent gout flare ups  7. Low serum vitamin D Takes daily vitammin d supplement when sh eremembers  8. Lung nodule No change on last chest xray    Outpatient Encounter Medications as of 09/21/2019  Medication Sig  . amLODipine (NORVASC) 5 MG tablet TAKE 1 TABLET DAILY  . apixaban (ELIQUIS) 5 MG TABS tablet Take 1 tablet (5 mg total) by mouth 2 (two) times daily.  Marland Kitchen atorvastatin (LIPITOR) 40 MG tablet TAKE 1 TABLET ONCE DAILY IN THE EVENING  . calcium-vitamin D (OSCAL 500/200 D-3) 500-200 MG-UNIT per tablet Take 1 tablet by mouth 2 (two) times daily.  Marland Kitchen ezetimibe (ZETIA) 10 MG tablet TAKE 1 TABLET DAILY  . furosemide (LASIX) 40 MG tablet TAKE 1 TABLET DAILY  . metoprolol tartrate (LOPRESSOR) 25 MG tablet TAKE  (1)  TABLET TWICE A DAY.  Marland Kitchen NITROSTAT 0.4 MG SL tablet PLACE 1 TABLET UNDER THE TONGUE EVERY 5 MINUTES AS NEEDED FOR CHEST PA IN  . potassium chloride SA (KLOR-CON M20) 20 MEQ tablet Take 1 tablet (20 mEq total) by mouth 2 (two) times daily.     Past Surgical History:  Procedure Laterality Date  . heart by pass      Family History  Problem Relation Age of  Onset  . Heart disease Mother   . Alcohol abuse Father   . Alzheimer's disease Sister   . Cancer Sister     New complaints: None today  Social history: Lives with husband  Controlled substance contract: n/a    Review of Systems  Constitutional: Negative for diaphoresis and weight loss.  Eyes: Negative for blurred vision, double vision and pain.  Respiratory: Negative for shortness of breath.   Cardiovascular: Negative for chest pain,  palpitations, orthopnea and leg swelling.  Gastrointestinal: Negative for abdominal pain.  Skin: Negative for rash.  Neurological: Negative for dizziness, sensory change, loss of consciousness, weakness and headaches.  Endo/Heme/Allergies: Negative for polydipsia. Does not bruise/bleed easily.  Psychiatric/Behavioral: Negative for memory loss. The patient does not have insomnia.   All other systems reviewed and are negative.    Observations/Objective: Alert and oriented- answers all questions appropriately No distress    Assessment and Plan: Laura Jacobs comes in today with chief complaint of Medical Management of Chronic Issues   Diagnosis and orders addressed:  1. Hypertensive heart disease  Low sodium diet - amLODipine (NORVASC) 5 MG tablet; Take 1 tablet (5 mg total) by mouth daily.  Dispense: 90 tablet; Refill: 1 - metoprolol tartrate (LOPRESSOR) 25 MG tablet; TAKE  (1)  TABLET TWICE A DAY.  Dispense: 180 tablet; Refill: 1 - furosemide (LASIX) 40 MG tablet; Take 1 tablet (40 mg total) by mouth daily.  Dispense: 90 tablet; Refill: 1  2. Coronary artery disease involving native coronary artery of native heart without angina pectoris deneis followup - apixaban (ELIQUIS) 5 MG TABS tablet; Take 1 tablet (5 mg total) by mouth 2 (two) times daily.  Dispense: 180 tablet; Refill: 1 - potassium chloride SA (KLOR-CON M20) 20 MEQ tablet; Take 1 tablet (20 mEq total) by mouth 2 (two) times daily.  Dispense: 30 tablet; Refill: 5  3. Paroxysmal atrial fibrillation (HCC) Avoid caffeine  4. Chronic kidney disease (CKD), stage IV (severe) (HCC) Labs to be done at h=next cvisit  5. Hyperlipidemia, unspecified hyperlipidemia type Low fat diet - ezetimibe (ZETIA) 10 MG tablet; Take 1 tablet (10 mg total) by mouth daily.  Dispense: 90 tablet; Refill: 1 - atorvastatin (LIPITOR) 40 MG tablet; TAKE 1 TABLET ONCE DAILY IN THE EVENING  Dispense: 90 tablet; Refill: 1  6. Chronic gout  without tophus, unspecified cause, unspecified site Low purine diet  7. Low serum vitamin D Encouraged to take vitamin supplement daily  8. Lung nodule Will repeat chest xray at next visit   Labs pending Health Maintenance reviewed Diet and exercise encouraged   Follow Up Instructions: 3 months    I discussed the assessment and treatment plan with the patient. The patient was provided an opportunity to ask questions and all were answered. The patient agreed with the plan and demonstrated an understanding of the instructions.   The patient was advised to call back or seek an in-person evaluation if the symptoms worsen or if the condition fails to improve as anticipated.  The above assessment and management plan was discussed with the patient. The patient verbalized understanding of and has agreed to the management plan. Patient is aware to call the clinic if symptoms persist or worsen. Patient is aware when to return to the clinic for a follow-up visit. Patient educated on when it is appropriate to go to the emergency department.   Time call ended:  11:46  I provided 16 minutes of non-face-to-face time during this encounter.  Mary-Margaret Anthoney Sheppard, FNP   

## 2019-10-29 DIAGNOSIS — R69 Illness, unspecified: Secondary | ICD-10-CM | POA: Diagnosis not present

## 2019-10-29 DIAGNOSIS — R5381 Other malaise: Secondary | ICD-10-CM | POA: Diagnosis not present

## 2019-11-12 ENCOUNTER — Other Ambulatory Visit: Payer: Self-pay

## 2019-11-15 ENCOUNTER — Ambulatory Visit (INDEPENDENT_AMBULATORY_CARE_PROVIDER_SITE_OTHER): Payer: Medicare Other | Admitting: Nurse Practitioner

## 2019-11-15 ENCOUNTER — Encounter: Payer: Self-pay | Admitting: Nurse Practitioner

## 2019-11-15 ENCOUNTER — Other Ambulatory Visit: Payer: Self-pay

## 2019-11-15 VITALS — BP 139/82 | HR 86 | Temp 96.0°F | Resp 20 | Ht 63.0 in | Wt 125.0 lb

## 2019-11-15 DIAGNOSIS — I2510 Atherosclerotic heart disease of native coronary artery without angina pectoris: Secondary | ICD-10-CM | POA: Diagnosis not present

## 2019-11-15 DIAGNOSIS — E782 Mixed hyperlipidemia: Secondary | ICD-10-CM

## 2019-11-15 DIAGNOSIS — N184 Chronic kidney disease, stage 4 (severe): Secondary | ICD-10-CM

## 2019-11-15 DIAGNOSIS — I119 Hypertensive heart disease without heart failure: Secondary | ICD-10-CM | POA: Diagnosis not present

## 2019-11-15 DIAGNOSIS — I480 Paroxysmal atrial fibrillation: Secondary | ICD-10-CM

## 2019-11-15 DIAGNOSIS — M810 Age-related osteoporosis without current pathological fracture: Secondary | ICD-10-CM

## 2019-11-15 DIAGNOSIS — M1A9XX0 Chronic gout, unspecified, without tophus (tophi): Secondary | ICD-10-CM

## 2019-11-15 DIAGNOSIS — F411 Generalized anxiety disorder: Secondary | ICD-10-CM

## 2019-11-15 MED ORDER — CITALOPRAM HYDROBROMIDE 20 MG PO TABS: 20.0000 mg | ORAL_TABLET | Freq: Every day | ORAL | 5 refills | Status: DC

## 2019-11-15 NOTE — Progress Notes (Signed)
Subjective:    Patient ID: Laura Jacobs, female    DOB: 14-Sep-1934, 86 y.o.   MRN: [id number]   Chief Complaint: Medical Management of Chronic Issues    HPI:  1. Hypertensive heart disease  No c/o chest pain, sob or headache. Does not check blood pressure at home. BP Readings from Last 3 Encounters:  11/15/19 139/82  05/28/19 (!) 174/106  08/21/18 124/80    2. Mixed hyperlipidemia Has a very poor appeitie. Lab Results  Component Value Date   CHOL 104 08/21/2018   HDL 54 08/21/2018   LDLCALC 34 08/21/2018   TRIG 80 08/21/2018   CHOLHDL 1.9 08/21/2018     3. Coronary artery disease involving native coronary artery of native heart without angina pectoris Has not seen catdiologist lately. She had an echocardiogram  Scheduled in 2018 but patient did not show for appointment.  4. Paroxysmal atrial fibrillation ([place]) She denies any palpitations. No heart racing. Is on eliquis daily and is doing well.  5. Chronic kidney disease (CKD), stage IV (severe) ([place]) Lab Results  Component Value Date   CREATININE 1.43 (H) 08/21/2018     6. Age related osteoporosis, unspecified pathological fracture presence Does no weight bearing exercise. She does not want to do anymore dexascans  7. Chronic gout without tophus, unspecified cause, unspecified site No recent flare ups.  GAD 7 : Generalized Anxiety Score 11/15/2019  Nervous, Anxious, on Edge 1  Control/stop worrying 1  Worry too much - different things 1  Trouble relaxing 0  Restless 0  Easily annoyed or irritable 1  Afraid - awful might happen 0  Total GAD 7 Score 4  Anxiety Difficulty Not difficult at all    Depression screen [facility] 2/9 11/15/2019 05/28/2019 04/01/2019  Decreased Interest 0 0 0  Down, Depressed, Hopeless 0 0 0  PHQ - 2 Score 0 0 0     Outpatient Encounter Medications as of 11/15/2019  Medication Sig  . amLODipine (NORVASC) 5 MG tablet Take 1 tablet (5 mg total) by mouth daily.  Marland Kitchen apixaban  (ELIQUIS) 5 MG TABS tablet Take 1 tablet (5 mg total) by mouth 2 (two) times daily.  Marland Kitchen atorvastatin (LIPITOR) 40 MG tablet TAKE 1 TABLET ONCE DAILY IN THE EVENING  . calcium-vitamin D (OSCAL 500/200 D-3) 500-200 MG-UNIT per tablet Take 1 tablet by mouth 2 (two) times daily.  Marland Kitchen ezetimibe (ZETIA) 10 MG tablet Take 1 tablet (10 mg total) by mouth daily.  . furosemide (LASIX) 40 MG tablet Take 1 tablet (40 mg total) by mouth daily.  . metoprolol tartrate (LOPRESSOR) 25 MG tablet TAKE  (1)  TABLET TWICE A DAY.  Marland Kitchen NITROSTAT 0.4 MG SL tablet PLACE 1 TABLET UNDER THE TONGUE EVERY 5 MINUTES AS NEEDED FOR CHEST PA IN  . potassium chloride SA (KLOR-CON M20) 20 MEQ tablet Take 1 tablet (20 mEq total) by mouth 2 (two) times daily.    Past Surgical History:  Procedure Laterality Date  . heart by pass      Family History  Problem Relation Age of Onset  . Heart disease Mother   . Alcohol abuse Father   . Alzheimer's disease Sister   . Cancer Sister     New complaints: None today  Social history: Her husband passed away since last visit  Controlled substance contract: n/a    Review of Systems  Constitutional: Positive for appetite change. Negative for diaphoresis.  Eyes: Negative for pain.  Respiratory: Negative for shortness of breath.  Cardiovascular: Negative for chest pain, palpitations and leg swelling.  Gastrointestinal: Negative for abdominal pain.  Endocrine: Negative for polydipsia.  Skin: Negative for rash.  Neurological: Negative for dizziness, weakness and headaches.  Hematological: Does not bruise/bleed easily.  Psychiatric/Behavioral: The patient is nervous/anxious.   All other systems reviewed and are negative.      Objective:   Physical Exam Nursing note reviewed.  Constitutional:      General: She is not in acute distress.    Appearance: Normal appearance. She is well-developed.  HENT:     Head: Normocephalic.     Nose: Nose normal.  Eyes:     Pupils: Pupils  are equal, round, and reactive to light.  Neck:     Vascular: No carotid bruit or JVD.  Cardiovascular:     Rate and Rhythm: Normal rate and regular rhythm.     Heart sounds: Normal heart sounds.  Pulmonary:     Effort: Pulmonary effort is normal. No respiratory distress.     Breath sounds: Normal breath sounds. No wheezing or rales.  Chest:     Chest wall: No tenderness.  Abdominal:     General: Bowel sounds are normal. There is no distension or abdominal bruit.     Palpations: Abdomen is soft. There is no hepatomegaly, splenomegaly, mass or pulsatile mass.     Tenderness: There is no abdominal tenderness.  Musculoskeletal:        General: Normal range of motion.     Cervical back: Normal range of motion and neck supple.     Right lower leg: Edema present.     Left lower leg: Edema present.  Lymphadenopathy:     Cervical: No cervical adenopathy.  Skin:    General: Skin is warm and dry.  Neurological:     Mental Status: She is alert and oriented to person, place, and time.     Deep Tendon Reflexes: Reflexes are normal and symmetric.  Psychiatric:        Behavior: Behavior normal.        Thought Content: Thought content normal.        Judgment: Judgment normal.     BP 139/82   Pulse 86   Temp (!) 96 F (35.6 C) (Temporal)   Resp 20   Ht 5\' 3"  (1.6 m)   Wt 125 lb (56.7 kg)   SpO2 100%   BMI 22.14 kg/m        Assessment & Plan:  Laura Jacobs comes in today with chief complaint of Medical Management of Chronic Issues   Diagnosis and orders addressed:  1. Hypertensive heart disease  Low sodium diet  2. Mixed hyperlipidemia Low fat diet  3. Coronary artery disease involving native coronary artery of native heart without angina pectoris Needs ot see cardiology but refuses  4. Paroxysmal atrial fibrillation (HCC) Avoid caffeine  5. Chronic kidney disease (CKD), stage IV (severe) ([place]) Labs pending  6. Age related osteoporosis, unspecified  pathological fracture presence Weight bearing exercises  7. Chronic gout without tophus, unspecified cause, unspecified site  8. GAD (generalized anxiety disorder) Stress management - citalopram (CELEXA) 20 MG tablet; Take 1 tablet (20 mg total) by mouth daily.  Dispense: 30 tablet; Refill: 5   Labs pending Health Maintenance reviewed Diet and exercise encouraged  Follow up plan: 3 months   [name], FNP

## 2019-11-15 NOTE — Patient Instructions (Signed)
Stress, Adult Stress is a normal reaction to life events. Stress is what you feel when life demands more than you are used to, or more than you think you can handle. Some stress can be useful, such as studying for a test or meeting a deadline at work. Stress that occurs too often or for too long can cause problems. It can affect your emotional health and interfere with relationships and normal daily activities. Too much stress can weaken your body's defense system (immune system) and increase your risk for physical illness. If you already have a medical problem, stress can make it worse. What are the causes? All sorts of life events can cause stress. An event that causes stress for one person may not be stressful for another person. Major life events, whether positive or negative, commonly cause stress. Examples include:  Losing a job or starting a new job.  Losing a loved one.  Moving to a new town or home.  Getting married or divorced.  Having a baby.  Getting injured or sick. Less obvious life events can also cause stress, especially if they occur day after day or in combination with each other. Examples include:  Working long hours.  Driving in traffic.  Caring for children.  Being in debt.  Being in a difficult relationship. What are the signs or symptoms? Stress can cause emotional symptoms, including:  Anxiety. This is feeling worried, afraid, on edge, overwhelmed, or out of control.  Anger, including irritation or impatience.  Depression. This is feeling sad, down, helpless, or guilty.  Trouble focusing, remembering, or making decisions. Stress can cause physical symptoms, including:  Aches and pains. These may affect your head, neck, back, stomach, or other areas of your body.  Tight muscles or a clenched jaw.  Low energy.  Trouble sleeping. Stress can cause unhealthy behaviors, including:  Eating to feel better (overeating) or skipping meals.  Working too  much or putting off tasks.  Smoking, drinking alcohol, or using drugs to feel better. How is this diagnosed? Stress is diagnosed through an assessment by your health care provider. He or she may diagnose this condition based on:  Your symptoms and any stressful life events.  Your medical history.  Tests to rule out other causes of your symptoms. Depending on your condition, your health care provider may refer you to a specialist for further evaluation. How is this treated?  Stress management techniques are the recommended treatment for stress. Medicine is not typically recommended for the treatment of stress. Techniques to reduce your reaction to stressful life events include:  Stress identification. Monitor yourself for symptoms of stress and identify what causes stress for you. These skills may help you to avoid or prepare for stressful events.  Time management. Set your priorities, keep a calendar of events, and learn to say no. Taking these actions can help you avoid making too many commitments. Techniques for coping with stress include:  Rethinking the problem. Try to think realistically about stressful events rather than ignoring them or overreacting. Try to find the positives in a stressful situation rather than focusing on the negatives.  Exercise. Physical exercise can release both physical and emotional tension. The key is to find a form of exercise that you enjoy and do it regularly.  Relaxation techniques. These relax the body and mind. The key is to find one or more that you enjoy and use the techniques regularly. Examples include: ? Meditation, deep breathing, or progressive relaxation techniques. ? Yoga or   tai chi. ? Biofeedback, mindfulness techniques, or journaling. ? Listening to music, being out in nature, or participating in other hobbies.  Practicing a healthy lifestyle. Eat a balanced diet, drink plenty of water, limit or avoid caffeine, and get plenty of  sleep.  Having a strong support network. Spend time with family, friends, or other people you enjoy being around. Express your feelings and talk things over with someone you trust. Counseling or talk therapy with a mental health professional may be helpful if you are having trouble managing stress on your own. Follow these instructions at home: Lifestyle   Avoid drugs.  Do not use any products that contain nicotine or tobacco, such as cigarettes, e-cigarettes, and chewing tobacco. If you need help quitting, ask your health care provider.  Limit alcohol intake to no more than 1 drink a day for nonpregnant women and 2 drinks a day for men. One drink equals 12 oz of beer, 5 oz of wine, or 1 oz of hard liquor  Do not use alcohol or drugs to relax.  Eat a balanced diet that includes fresh fruits and vegetables, whole grains, lean meats, fish, eggs, and beans, and low-fat dairy. Avoid processed foods and foods high in added fat, sugar, and salt.  Exercise at least 30 minutes on 5 or more days each week.  Get 7-8 hours of sleep each night. General instructions   Practice stress management techniques as discussed with your health care provider.  Drink enough fluid to keep your urine clear or pale yellow.  Take over-the-counter and prescription medicines only as told by your health care provider.  Keep all follow-up visits as told by your health care provider. This is important. Contact a health care provider if:  Your symptoms get worse.  You have new symptoms.  You feel overwhelmed by your problems and can no longer manage them on your own. Get help right away if:  You have thoughts of hurting yourself or others. If you ever feel like you may hurt yourself or others, or have thoughts about taking your own life, get help right away. You can go to your nearest emergency department or call:  Your local emergency services (911 in the [place]  A suicide crisis helpline, such as the  [place] at (316) 250-6172. This is open 24 hours a day. Summary  Stress is a normal reaction to life events. It can cause problems if it happens too often or for too long.  Practicing stress management techniques is the best way to treat stress.  Counseling or talk therapy with a mental health professional may be helpful if you are having trouble managing stress on your own. This information is not intended to replace advice given to you by your health care provider. Make sure you discuss any questions you have with your health care provider. Document Revised: 04/23/2019 Document Reviewed: 11/13/2016 Elsevier Patient Education  [place].

## 2020-02-04 ENCOUNTER — Emergency Department (HOSPITAL_COMMUNITY)
Admission: EM | Admit: 2020-02-04 | Discharge: 2020-02-05 | Disposition: A | Discharge status: Home / Self Care | Payer: Medicare Other | Attending: Emergency Medicine | Admitting: Emergency Medicine

## 2020-02-04 ENCOUNTER — Emergency Department (HOSPITAL_COMMUNITY): Payer: Medicare Other

## 2020-02-04 ENCOUNTER — Other Ambulatory Visit: Payer: Self-pay

## 2020-02-04 ENCOUNTER — Encounter (HOSPITAL_COMMUNITY): Payer: Self-pay

## 2020-02-04 DIAGNOSIS — R0902 Hypoxemia: Secondary | ICD-10-CM | POA: Diagnosis not present

## 2020-02-04 DIAGNOSIS — I213 ST elevation (STEMI) myocardial infarction of unspecified site: Secondary | ICD-10-CM | POA: Diagnosis not present

## 2020-02-04 DIAGNOSIS — I129 Hypertensive chronic kidney disease with stage 1 through stage 4 chronic kidney disease, or unspecified chronic kidney disease: Secondary | ICD-10-CM | POA: Insufficient documentation

## 2020-02-04 DIAGNOSIS — Z87891 Personal history of nicotine dependence: Secondary | ICD-10-CM | POA: Diagnosis not present

## 2020-02-04 DIAGNOSIS — N184 Chronic kidney disease, stage 4 (severe): Secondary | ICD-10-CM | POA: Diagnosis not present

## 2020-02-04 DIAGNOSIS — I480 Paroxysmal atrial fibrillation: Secondary | ICD-10-CM

## 2020-02-04 DIAGNOSIS — R110 Nausea: Secondary | ICD-10-CM | POA: Diagnosis not present

## 2020-02-04 DIAGNOSIS — R17 Unspecified jaundice: Secondary | ICD-10-CM

## 2020-02-04 DIAGNOSIS — R61 Generalized hyperhidrosis: Secondary | ICD-10-CM | POA: Diagnosis present

## 2020-02-04 DIAGNOSIS — Z7901 Long term (current) use of anticoagulants: Secondary | ICD-10-CM | POA: Insufficient documentation

## 2020-02-04 DIAGNOSIS — R531 Weakness: Secondary | ICD-10-CM | POA: Diagnosis not present

## 2020-02-04 DIAGNOSIS — R000 Tachycardia, unspecified: Secondary | ICD-10-CM | POA: Diagnosis not present

## 2020-02-04 DIAGNOSIS — R079 Chest pain, unspecified: Secondary | ICD-10-CM | POA: Diagnosis not present

## 2020-02-04 DIAGNOSIS — N289 Disorder of kidney and ureter, unspecified: Secondary | ICD-10-CM | POA: Diagnosis not present

## 2020-02-04 DIAGNOSIS — I2510 Atherosclerotic heart disease of native coronary artery without angina pectoris: Secondary | ICD-10-CM | POA: Diagnosis not present

## 2020-02-04 DIAGNOSIS — Z79899 Other long term (current) drug therapy: Secondary | ICD-10-CM | POA: Diagnosis not present

## 2020-02-04 LAB — CBC WITH DIFFERENTIAL/PLATELET
Abs Immature Granulocytes: 0.1 10*3/uL — ABNORMAL HIGH (ref 0.00–0.07)
Basophils Absolute: 0.1 10*3/uL (ref 0.0–0.1)
Basophils Relative: 0 %
Eosinophils Absolute: 0.1 10*3/uL (ref 0.0–0.5)
Eosinophils Relative: 0 %
HCT: 35.1 % — ABNORMAL LOW (ref 36.0–46.0)
Hemoglobin: 11.5 g/dL — ABNORMAL LOW (ref 12.0–15.0)
Immature Granulocytes: 1 %
Lymphocytes Relative: 6 %
Lymphs Abs: 1 10*3/uL (ref 0.7–4.0)
MCH: 31.2 pg (ref 26.0–34.0)
MCHC: 32.8 g/dL (ref 30.0–36.0)
MCV: 95.1 fL (ref 80.0–100.0)
Monocytes Absolute: 1.4 10*3/uL — ABNORMAL HIGH (ref 0.1–1.0)
Monocytes Relative: 8 %
Neutro Abs: 15.2 10*3/uL — ABNORMAL HIGH (ref 1.7–7.7)
Neutrophils Relative %: 85 %
Platelets: 284 10*3/uL (ref 150–400)
RBC: 3.69 MIL/uL — ABNORMAL LOW (ref 3.87–5.11)
RDW: 13.6 % (ref 11.5–15.5)
WBC: 17.8 10*3/uL — ABNORMAL HIGH (ref 4.0–10.5)
nRBC: 0 % (ref 0.0–0.2)

## 2020-02-04 LAB — COMPREHENSIVE METABOLIC PANEL
ALT: 13 U/L (ref 0–44)
AST: 22 U/L (ref 15–41)
Albumin: 3.6 g/dL (ref 3.5–5.0)
Alkaline Phosphatase: 71 U/L (ref 38–126)
Anion gap: 11 (ref 5–15)
BUN: 26 mg/dL — ABNORMAL HIGH (ref 8–23)
CO2: 24 mmol/L (ref 22–32)
Calcium: 9.1 mg/dL (ref 8.9–10.3)
Chloride: 99 mmol/L (ref 98–111)
Creatinine, Ser: 2.22 mg/dL — ABNORMAL HIGH (ref 0.44–1.00)
GFR calc Af Amer: 23 mL/min — ABNORMAL LOW (ref >60)
GFR calc non Af Amer: 20 mL/min — ABNORMAL LOW (ref >60)
Glucose, Bld: 114 mg/dL — ABNORMAL HIGH (ref 70–99)
Potassium: 3.7 mmol/L (ref 3.5–5.1)
Sodium: 134 mmol/L — ABNORMAL LOW (ref 135–145)
Total Bilirubin: 1.5 mg/dL — ABNORMAL HIGH (ref 0.3–1.2)
Total Protein: 7.4 g/dL (ref 6.5–8.1)

## 2020-02-04 LAB — PROTIME-INR
INR: 2.3 — ABNORMAL HIGH (ref 0.8–1.2)
Prothrombin Time: 24.5 seconds — ABNORMAL HIGH (ref 11.4–15.2)

## 2020-02-04 LAB — MAGNESIUM: Magnesium: 1.3 mg/dL — ABNORMAL LOW (ref 1.7–2.4)

## 2020-02-04 LAB — TROPONIN I (HIGH SENSITIVITY): Troponin I (High Sensitivity): 19 ng/L — ABNORMAL HIGH (ref <18)

## 2020-02-04 LAB — LIPASE, BLOOD: Lipase: 25 U/L (ref 11–51)

## 2020-02-04 MED ORDER — MAGNESIUM SULFATE 2 GM/50ML IV SOLN
2.0000 g | Freq: Once | INTRAVENOUS | Status: AC
  Administered 2020-02-04: 2 g via INTRAVENOUS
  Filled 2020-02-04: qty 50

## 2020-02-04 NOTE — ED Notes (Signed)
CODE STEMI CANCELLED

## 2020-02-04 NOTE — ED Provider Notes (Addendum)
[facility] EMERGENCY DEPARTMENT Provider Note   CSN: [id number] Arrival date & time: 02/04/20  2200     History Chief Complaint  Patient presents with  . Code STEMI    Laura Jacobs is a 86 y.o. female.  Presenting to ER as code STEMI.  EMS reported episode of diaphoresis, nausea, concern for ST depressions in V2 through V3 concerning for acute posterior MI.  Patient reports that this evening she was in her normal state of health when she suddenly felt nauseous, clammy.  Did not have any associated chest pain or difficulty breathing, states this episode lasted for a few minutes and resolved relatively quickly.  She has no recurrence of symptoms, currently has no ongoing symptoms.  Reports that she was in her normal state of health throughout the day today.  Denies any associated fevers or vomiting, abdominal pain.  Past medical history A. Fib not on eliquis, CAD s/p CABG. HPI     Past Medical History:  Diagnosis Date  . Acute MI ([place])   . Atrial fibrillation, unspecified   . CAD (coronary artery disease)   . Gout   . Hyperlipidemia   . Hypertension     Patient Active Problem List   Diagnosis Date Noted  . Osteoporosis 02/27/2015  . Low serum vitamin D 02/27/2015  . Paroxysmal atrial fibrillation (HCC)   . Long term current use of anticoagulant therapy   . Lung nodule 08/23/2014  . CAD (coronary artery disease) 08/11/2013  . Gout 05/10/2013  . Hyperlipidemia 02/04/2013  . Chronic kidney disease (CKD), stage IV (severe) ([place]) 02/04/2013  . Hypertensive heart disease      Past Surgical History:  Procedure Laterality Date  . heart by pass       OB History   No obstetric history on file.     Family History  Problem Relation Age of Onset  . Heart disease Mother   . Alcohol abuse Father   . Alzheimer's disease Sister   . Cancer Sister     Social History   Tobacco Use  . Smoking status: Former Smoker    Packs/day: 0.50   Years: 60.00    Pack years: 30.00    Types: Cigarettes    Quit date: 08/07/2014    Years since quitting: 5.5  . Smokeless tobacco: Former Systems developer    Quit date: 05/11/2014  Substance Use Topics  . Alcohol use: No  . Drug use: No    Home Medications Prior to Admission medications   Medication Sig Start Date End Date Taking? Authorizing Provider  amLODipine (NORVASC) 5 MG tablet Take 1 tablet (5 mg total) by mouth daily. 09/21/19  Yes Hassell Done, Mary-Margaret, FNP  apixaban (ELIQUIS) 5 MG TABS tablet Take 1 tablet (5 mg total) by mouth 2 (two) times daily. 09/21/19  Yes Martin, Mary-Margaret, FNP  atorvastatin (LIPITOR) 40 MG tablet TAKE 1 TABLET ONCE DAILY IN THE EVENING Patient taking differently: Take 40 mg by mouth every evening.  09/21/19  Yes Martin, Mary-Margaret, FNP  calcium-vitamin D (OSCAL 500/200 D-3) 500-200 MG-UNIT per tablet Take 1 tablet by mouth 2 (two) times daily. 02/27/15  Yes Cherre Robins, PharmD  citalopram (CELEXA) 20 MG tablet Take 1 tablet (20 mg total) by mouth daily. 11/15/19  Yes Hassell Done, Mary-Margaret, FNP  ezetimibe (ZETIA) 10 MG tablet Take 1 tablet (10 mg total) by mouth daily. 09/21/19  Yes Martin, Mary-Margaret, FNP  furosemide (LASIX) 40 MG tablet Take 1 tablet (40 mg total) by mouth  daily. 09/21/19  Yes Hassell Done, Mary-Margaret, FNP  metoprolol tartrate (LOPRESSOR) 25 MG tablet TAKE  (1)  TABLET TWICE A DAY. Patient taking differently: Take 25 mg by mouth 2 (two) times daily.  09/21/19  Yes Martin, Mary-Margaret, FNP  NITROSTAT 0.4 MG SL tablet PLACE 1 TABLET UNDER THE TONGUE EVERY 5 MINUTES AS NEEDED FOR CHEST PA IN Patient taking differently: Place 0.4 mg under the tongue every 5 (five) minutes as needed for chest pain.  11/09/15  Yes Martin, Mary-Margaret, FNP  potassium chloride SA (KLOR-CON M20) 20 MEQ tablet Take 1 tablet (20 mEq total) by mouth 2 (two) times daily. 09/21/19  Yes Hassell Done Mary-Margaret, FNP    Allergies    Patient has no known allergies.  Review  of Systems   Review of Systems  Constitutional: Negative for chills and fever.  HENT: Negative for ear pain and sore throat.   Eyes: Negative for pain and visual disturbance.  Respiratory: Negative for cough and shortness of breath.   Cardiovascular: Negative for chest pain and palpitations.  Gastrointestinal: Positive for nausea. Negative for abdominal pain and vomiting.  Genitourinary: Negative for dysuria and hematuria.  Musculoskeletal: Negative for arthralgias and back pain.  Skin: Negative for color change and rash.  Neurological: Negative for seizures and syncope.  All other systems reviewed and are negative.   Physical Exam Updated Vital Signs BP 110/68   Pulse 84   Temp 98.3 F (36.8 C) (Oral)   Resp 19   Ht 5\' 3"  (1.6 m)   Wt 56.7 kg   SpO2 98%   BMI 22.14 kg/m   Physical Exam Vitals and nursing note reviewed.  Constitutional:      General: She is not in acute distress.    Appearance: She is well-developed.  HENT:     Head: Normocephalic and atraumatic.  Eyes:     Conjunctiva/sclera: Conjunctivae normal.  Cardiovascular:     Rate and Rhythm: Normal rate and regular rhythm.     Heart sounds: No murmur.  Pulmonary:     Effort: Pulmonary effort is normal. No respiratory distress.     Breath sounds: Normal breath sounds.  Abdominal:     Palpations: Abdomen is soft.     Tenderness: There is no abdominal tenderness.  Musculoskeletal:        General: No deformity or signs of injury.     Cervical back: Neck supple.  Skin:    General: Skin is warm and dry.  Neurological:     Mental Status: She is alert.     ED Results / Procedures / Treatments   Labs (all labs ordered are listed, but only abnormal results are displayed) Labs Reviewed  CBC WITH DIFFERENTIAL/PLATELET - Abnormal; Notable for the following components:      Result Value   WBC 17.8 (*)    RBC 3.69 (*)    Hemoglobin 11.5 (*)    HCT 35.1 (*)    Neutro Abs 15.2 (*)    Monocytes Absolute 1.4  (*)    Abs Immature Granulocytes 0.10 (*)    All other components within normal limits  COMPREHENSIVE METABOLIC PANEL - Abnormal; Notable for the following components:   Sodium 134 (*)    Glucose, Bld 114 (*)    BUN 26 (*)    Creatinine, Ser 2.22 (*)    Total Bilirubin 1.5 (*)    GFR calc non Af Amer 20 (*)    GFR calc Af Amer 23 (*)    All other  components within normal limits  PROTIME-INR - Abnormal; Notable for the following components:   Prothrombin Time 24.5 (*)    INR 2.3 (*)    All other components within normal limits  MAGNESIUM - Abnormal; Notable for the following components:   Magnesium 1.3 (*)    All other components within normal limits  TROPONIN I (HIGH SENSITIVITY) - Abnormal; Notable for the following components:   Troponin I (High Sensitivity) 19 (*)    All other components within normal limits  TROPONIN I (HIGH SENSITIVITY) - Abnormal; Notable for the following components:   Troponin I (High Sensitivity) 20 (*)    All other components within normal limits  LIPASE, BLOOD    EKG EKG Interpretation  Date/Time:  Friday February 04 2020 22:06:01 EDT Ventricular Rate:  111 PR Interval:    QRS Duration: 83 QT Interval:  323 QTC Calculation: 439 R Axis:   79 Text Interpretation: Atrial fibrillation Low voltage, precordial leads Repol abnrm suggests ischemia, anterolateral no acute STEMI Confirmed by Madalyn Rob [phone]) on 02/04/2020 10:07:40 PM   Radiology DG Chest Portable 1 View  Result Date: 02/04/2020 CLINICAL DATA:  Nausea, diaphoresis and weakness. EXAM: PORTABLE CHEST 1 VIEW COMPARISON:  September 08, 2017 FINDINGS: Multiple sternal wires and vascular clips are seen. Mild atelectasis and/or infiltrate is seen within the right lung base. There is no evidence of a pleural effusion or pneumothorax. The heart size and mediastinal contours are within normal limits. There is mild calcification of the aortic arch. Degenerative changes seen throughout the thoracic  spine. IMPRESSION: 1. Evidence of prior median sternotomy/CABG. 2. Mild right basilar atelectasis and/or infiltrate. Electronically Signed   By: Virgina Norfolk M.D.   On: 02/04/2020 22:35    Procedures .Critical Care Performed by: Lucrezia Starch, MD Authorized by: Lucrezia Starch, MD   Critical care provider statement:    Critical care time (minutes):  34   Critical care was necessary to treat or prevent imminent or life-threatening deterioration of the following conditions:  Cardiac failure and metabolic crisis   Critical care was time spent personally by me on the following activities:  Discussions with consultants, evaluation of patient's response to treatment, examination of patient, ordering and performing treatments and interventions, ordering and review of laboratory studies, ordering and review of radiographic studies, pulse oximetry, re-evaluation of patient's condition, obtaining history from patient or surrogate and review of old charts   (including critical care time)  Medications Ordered in ED Medications  magnesium sulfate IVPB 2 g 50 mL ( Intravenous Stopped 02/05/20 0041)    ED Course  I have reviewed the triage vital signs and the nursing notes.  Pertinent labs & imaging results that were available during my care of the patient were reviewed by me and considered in my medical decision making (see chart for details).  Clinical Course as of Feb 04 1218  Fri Feb 04, 2020  2202 At bedside on arrival   [RD]  2207 Reviewed repeat EKG with Akhter at bedside - recommends cancel code stemi   [RD]  0349 Signed out to [facility]   [RD]    Clinical Course User Index [RD] Lucrezia Starch, MD   MDM Rules/Calculators/A&P                      86 year old lady afib, CAD presented to ER as code STEMI after having episode of diaphoresis and concern for possible posterior MI.  On arrival in ER, patient is stable  vital signs, no ongoing symptoms, well-appearing's.  Repeat  EKG in ER does not demonstrate any significant ST depressions to suggest ACS.  Initial troponin was borderline.  While awaiting further observation, repeat troponin, patient signed out to Dr. Roxanne Mins.  Noted leukocytosis however patient denies any specific infectious symptoms, is afebrile here.  Noted hypomagnesemia and provided replacement. If patient remains well-appearing without symptoms and repeat troponin stable, believe patient can be discharged home likely.  Refer to [facility] note for final plan.   Final Clinical Impression(s) / ED Diagnoses Final diagnoses:  Nonspecific chest pain  Paroxysmal atrial fibrillation (HCC)  Chronic anticoagulation  Hypomagnesemia  Renal insufficiency  Serum total bilirubin elevated    Rx / DC Orders ED Discharge Orders    None       Lucrezia Starch, MD 02/05/20 1220    Lucrezia Starch, MD 02/05/20 1222

## 2020-02-04 NOTE — ED Triage Notes (Signed)
Pt bib [facility] w/ c/o nausea, diaphoresis, acute onset of weakness. Pt has hx of MI. Code STEMI called by EMS. Pt denies SOB, chest pain. EMS VS:  HR 100 BP 120/70 SPO2 98% RA RR 18

## 2020-02-05 DIAGNOSIS — I480 Paroxysmal atrial fibrillation: Secondary | ICD-10-CM | POA: Diagnosis not present

## 2020-02-05 LAB — TROPONIN I (HIGH SENSITIVITY): Troponin I (High Sensitivity): 20 ng/L — ABNORMAL HIGH (ref <18)

## 2020-02-05 NOTE — ED Provider Notes (Signed)
Care assumed from Dr. Roslynn Amble, patient presented as a code STEMI which was canceled after arrival. Initial troponin was minimally elevated and repeat troponin is unchanged. Her ECG does show atrial fibrillation and she has a history of paroxysmal atrial fibrillation. There were no acute ST or T changes present. She is symptom-free at this point and felt to be safe for discharge. She is instructed to follow-up with her cardiologist in the next week. Return precautions discussed.   Laura Fuel, MD 31[birth year]42 [phone]

## 2020-02-05 NOTE — Discharge Instructions (Addendum)
Return if you are having any problems. 

## 2020-02-07 ENCOUNTER — Telehealth (HOSPITAL_COMMUNITY): Payer: Self-pay

## 2020-02-07 NOTE — Telephone Encounter (Signed)
Reached out to patient to schedule ED follow-up. No voicemail.

## 2020-02-08 ENCOUNTER — Encounter (HOSPITAL_COMMUNITY): Payer: Self-pay | Admitting: Emergency Medicine

## 2020-02-08 ENCOUNTER — Other Ambulatory Visit: Payer: Self-pay

## 2020-02-08 ENCOUNTER — Inpatient Hospital Stay (HOSPITAL_COMMUNITY)
Admission: EM | Admit: 2020-02-08 | Discharge: 2020-02-11 | DRG: 871 | Disposition: A | Discharge status: Home Health | Payer: Medicare Other | Attending: Internal Medicine | Admitting: Internal Medicine

## 2020-02-08 DIAGNOSIS — I130 Hypertensive heart and chronic kidney disease with heart failure and stage 1 through stage 4 chronic kidney disease, or unspecified chronic kidney disease: Secondary | ICD-10-CM | POA: Diagnosis not present

## 2020-02-08 DIAGNOSIS — I2510 Atherosclerotic heart disease of native coronary artery without angina pectoris: Secondary | ICD-10-CM | POA: Diagnosis present

## 2020-02-08 DIAGNOSIS — I340 Nonrheumatic mitral (valve) insufficiency: Secondary | ICD-10-CM | POA: Diagnosis not present

## 2020-02-08 DIAGNOSIS — Z20822 Contact with and (suspected) exposure to covid-19: Secondary | ICD-10-CM | POA: Diagnosis present

## 2020-02-08 DIAGNOSIS — N390 Urinary tract infection, site not specified: Secondary | ICD-10-CM | POA: Diagnosis not present

## 2020-02-08 DIAGNOSIS — J690 Pneumonitis due to inhalation of food and vomit: Secondary | ICD-10-CM | POA: Diagnosis not present

## 2020-02-08 DIAGNOSIS — I4891 Unspecified atrial fibrillation: Secondary | ICD-10-CM

## 2020-02-08 DIAGNOSIS — N200 Calculus of kidney: Secondary | ICD-10-CM | POA: Diagnosis not present

## 2020-02-08 DIAGNOSIS — J449 Chronic obstructive pulmonary disease, unspecified: Secondary | ICD-10-CM | POA: Diagnosis not present

## 2020-02-08 DIAGNOSIS — E785 Hyperlipidemia, unspecified: Secondary | ICD-10-CM | POA: Diagnosis present

## 2020-02-08 DIAGNOSIS — R000 Tachycardia, unspecified: Secondary | ICD-10-CM | POA: Diagnosis not present

## 2020-02-08 DIAGNOSIS — I5022 Chronic systolic (congestive) heart failure: Secondary | ICD-10-CM | POA: Diagnosis not present

## 2020-02-08 DIAGNOSIS — Z7901 Long term (current) use of anticoagulants: Secondary | ICD-10-CM | POA: Diagnosis not present

## 2020-02-08 DIAGNOSIS — R410 Disorientation, unspecified: Secondary | ICD-10-CM | POA: Diagnosis not present

## 2020-02-08 DIAGNOSIS — I480 Paroxysmal atrial fibrillation: Secondary | ICD-10-CM | POA: Diagnosis present

## 2020-02-08 DIAGNOSIS — R0602 Shortness of breath: Secondary | ICD-10-CM | POA: Diagnosis not present

## 2020-02-08 DIAGNOSIS — N184 Chronic kidney disease, stage 4 (severe): Secondary | ICD-10-CM | POA: Diagnosis not present

## 2020-02-08 DIAGNOSIS — A419 Sepsis, unspecified organism: Principal | ICD-10-CM | POA: Diagnosis present

## 2020-02-08 DIAGNOSIS — Z87891 Personal history of nicotine dependence: Secondary | ICD-10-CM

## 2020-02-08 DIAGNOSIS — Z79899 Other long term (current) drug therapy: Secondary | ICD-10-CM

## 2020-02-08 DIAGNOSIS — R0689 Other abnormalities of breathing: Secondary | ICD-10-CM | POA: Diagnosis not present

## 2020-02-08 DIAGNOSIS — R0902 Hypoxemia: Secondary | ICD-10-CM | POA: Diagnosis not present

## 2020-02-08 DIAGNOSIS — M109 Gout, unspecified: Secondary | ICD-10-CM | POA: Diagnosis not present

## 2020-02-08 DIAGNOSIS — Z951 Presence of aortocoronary bypass graft: Secondary | ICD-10-CM

## 2020-02-08 DIAGNOSIS — I350 Nonrheumatic aortic (valve) stenosis: Secondary | ICD-10-CM | POA: Diagnosis not present

## 2020-02-08 DIAGNOSIS — K8020 Calculus of gallbladder without cholecystitis without obstruction: Secondary | ICD-10-CM | POA: Diagnosis not present

## 2020-02-08 DIAGNOSIS — I959 Hypotension, unspecified: Secondary | ICD-10-CM | POA: Diagnosis not present

## 2020-02-08 LAB — COMPREHENSIVE METABOLIC PANEL
ALT: 16 U/L (ref 0–44)
AST: 24 U/L (ref 15–41)
Albumin: 3.6 g/dL (ref 3.5–5.0)
Alkaline Phosphatase: 71 U/L (ref 38–126)
Anion gap: 12 (ref 5–15)
BUN: 33 mg/dL — ABNORMAL HIGH (ref 8–23)
CO2: 22 mmol/L (ref 22–32)
Calcium: 8.5 mg/dL — ABNORMAL LOW (ref 8.9–10.3)
Chloride: 100 mmol/L (ref 98–111)
Creatinine, Ser: 1.98 mg/dL — ABNORMAL HIGH (ref 0.44–1.00)
GFR calc Af Amer: 26 mL/min — ABNORMAL LOW (ref >60)
GFR calc non Af Amer: 23 mL/min — ABNORMAL LOW (ref >60)
Glucose, Bld: 112 mg/dL — ABNORMAL HIGH (ref 70–99)
Potassium: 4.1 mmol/L (ref 3.5–5.1)
Sodium: 134 mmol/L — ABNORMAL LOW (ref 135–145)
Total Bilirubin: 1.2 mg/dL (ref 0.3–1.2)
Total Protein: 7.4 g/dL (ref 6.5–8.1)

## 2020-02-08 LAB — CBC
HCT: 36.4 % (ref 36.0–46.0)
Hemoglobin: 11.7 g/dL — ABNORMAL LOW (ref 12.0–15.0)
MCH: 30.6 pg (ref 26.0–34.0)
MCHC: 32.1 g/dL (ref 30.0–36.0)
MCV: 95.3 fL (ref 80.0–100.0)
Platelets: 314 10*3/uL (ref 150–400)
RBC: 3.82 MIL/uL — ABNORMAL LOW (ref 3.87–5.11)
RDW: 13.7 % (ref 11.5–15.5)
WBC: 15.3 10*3/uL — ABNORMAL HIGH (ref 4.0–10.5)
nRBC: 0 % (ref 0.0–0.2)

## 2020-02-08 LAB — LIPASE, BLOOD: Lipase: 28 U/L (ref 11–51)

## 2020-02-08 NOTE — ED Triage Notes (Signed)
Pt with c/o N/V/D x 2 days.

## 2020-02-08 NOTE — ED Provider Notes (Signed)
[facility] EMERGENCY DEPARTMENT Provider Note   CSN: [id number] Arrival date & time: 02/08/20  2233     History Chief Complaint  Patient presents with  . Diarrhea    Laura Jacobs is a 86 y.o. female.  Patient returns to the emergency department with complaints of continued nausea, vomiting and diarrhea.  Reports that her only complaint is that she has an "upset" stomach.  No chest pain or shortness of breath.  No fever.        Past Medical History:  Diagnosis Date  . Acute MI ([place])   . Atrial fibrillation, unspecified   . CAD (coronary artery disease)   . Gout   . Hyperlipidemia   . Hypertension     Patient Active Problem List   Diagnosis Date Noted  . Osteoporosis 02/27/2015  . Low serum vitamin D 02/27/2015  . Paroxysmal atrial fibrillation (HCC)   . Long term current use of anticoagulant therapy   . Lung nodule 08/23/2014  . CAD (coronary artery disease) 08/11/2013  . Gout 05/10/2013  . Hyperlipidemia 02/04/2013  . Chronic kidney disease (CKD), stage IV (severe) ([place]) 02/04/2013  . Hypertensive heart disease      Past Surgical History:  Procedure Laterality Date  . heart by pass       OB History   No obstetric history on file.     Family History  Problem Relation Age of Onset  . Heart disease Mother   . Alcohol abuse Father   . Alzheimer's disease Sister   . Cancer Sister     Social History   Tobacco Use  . Smoking status: Former Smoker    Packs/day: 0.50    Years: 60.00    Pack years: 30.00    Types: Cigarettes    Quit date: 08/07/2014    Years since quitting: 5.5  . Smokeless tobacco: Former Systems developer    Quit date: 05/11/2014  Substance Use Topics  . Alcohol use: No  . Drug use: No    Home Medications Prior to Admission medications   Medication Sig Start Date End Date Taking? Authorizing Provider  amLODipine (NORVASC) 5 MG tablet Take 1 tablet (5 mg total) by mouth daily. 09/21/19   Hassell Done Mary-Margaret, FNP  apixaban  (ELIQUIS) 5 MG TABS tablet Take 1 tablet (5 mg total) by mouth 2 (two) times daily. 09/21/19   Hassell Done, Mary-Margaret, FNP  atorvastatin (LIPITOR) 40 MG tablet TAKE 1 TABLET ONCE DAILY IN THE EVENING Patient taking differently: Take 40 mg by mouth every evening.  09/21/19   Hassell Done, Mary-Margaret, FNP  calcium-vitamin D (OSCAL 500/200 D-3) 500-200 MG-UNIT per tablet Take 1 tablet by mouth 2 (two) times daily. 02/27/15   Cherre Robins, PharmD  citalopram (CELEXA) 20 MG tablet Take 1 tablet (20 mg total) by mouth daily. 11/15/19   Hassell Done, Mary-Margaret, FNP  ezetimibe (ZETIA) 10 MG tablet Take 1 tablet (10 mg total) by mouth daily. 09/21/19   Hassell Done, Mary-Margaret, FNP  furosemide (LASIX) 40 MG tablet Take 1 tablet (40 mg total) by mouth daily. 09/21/19   Hassell Done, Mary-Margaret, FNP  metoprolol tartrate (LOPRESSOR) 25 MG tablet TAKE  (1)  TABLET TWICE A DAY. Patient taking differently: Take 25 mg by mouth 2 (two) times daily.  09/21/19   Hassell Done, Mary-Margaret, FNP  NITROSTAT 0.4 MG SL tablet PLACE 1 TABLET UNDER THE TONGUE EVERY 5 MINUTES AS NEEDED FOR CHEST PA IN Patient taking differently: Place 0.4 mg under the tongue every 5 (five) minutes as needed for  chest pain.  11/09/15   Hassell Done, Mary-Margaret, FNP  potassium chloride SA (KLOR-CON M20) 20 MEQ tablet Take 1 tablet (20 mEq total) by mouth 2 (two) times daily. 09/21/19   [name], FNP    Allergies    Patient has no known allergies.  Review of Systems   Review of Systems  Gastrointestinal: Positive for diarrhea, nausea and vomiting.  All other systems reviewed and are negative.   Physical Exam Updated Vital Signs BP 101/72   Pulse (!) 124   Temp 99.5 F (37.5 C) (Oral)   Resp 14   Ht 5\' 3"  (1.6 m)   Wt 57 kg   SpO2 96%   BMI 22.26 kg/m   Physical Exam Vitals and nursing note reviewed.  Constitutional:      General: She is not in acute distress.    Appearance: Normal appearance. She is well-developed.  HENT:     Head:  Normocephalic and atraumatic.     Right Ear: Hearing normal.     Left Ear: Hearing normal.     Nose: Nose normal.  Eyes:     Conjunctiva/sclera: Conjunctivae normal.     Pupils: Pupils are equal, round, and reactive to light.  Cardiovascular:     Rate and Rhythm: Tachycardia present. Rhythm irregularly irregular.     Heart sounds: S1 normal and S2 normal. No murmur. No friction rub. No gallop.   Pulmonary:     Effort: Pulmonary effort is normal. No respiratory distress.     Breath sounds: Normal breath sounds.  Chest:     Chest wall: No tenderness.  Abdominal:     General: Bowel sounds are normal.     Palpations: Abdomen is soft.     Tenderness: There is no abdominal tenderness. There is no guarding or rebound. Negative signs include Murphy's sign and McBurney's sign.     Hernia: No hernia is present.  Musculoskeletal:        General: Normal range of motion.     Cervical back: Normal range of motion and neck supple.  Skin:    General: Skin is warm and dry.     Findings: No rash.  Neurological:     Mental Status: She is alert and oriented to person, place, and time.     GCS: GCS eye subscore is 4. GCS verbal subscore is 5. GCS motor subscore is 6.     Cranial Nerves: No cranial nerve deficit.     Sensory: No sensory deficit.     Coordination: Coordination normal.  Psychiatric:        Speech: Speech normal.        Behavior: Behavior normal.        Thought Content: Thought content normal.     ED Results / Procedures / Treatments   Labs (all labs ordered are listed, but only abnormal results are displayed) Labs Reviewed  COMPREHENSIVE METABOLIC PANEL - Abnormal; Notable for the following components:      Result Value   Sodium 134 (*)    Glucose, Bld 112 (*)    BUN 33 (*)    Creatinine, Ser 1.98 (*)    Calcium 8.5 (*)    GFR calc non Af Amer 23 (*)    GFR calc Af Amer 26 (*)    All other components within normal limits  CBC - Abnormal; Notable for the following  components:   WBC 15.3 (*)    RBC 3.82 (*)    Hemoglobin 11.7 (*)  All other components within normal limits  CULTURE, BLOOD (ROUTINE X 2)  CULTURE, BLOOD (ROUTINE X 2)  LIPASE, BLOOD  URINALYSIS, ROUTINE W REFLEX MICROSCOPIC  LACTIC ACID, PLASMA    EKG EKG Interpretation  Date/Time:  Tuesday Feb 08 2020 22:46:27 EDT Ventricular Rate:  133 PR Interval:    QRS Duration: 80 QT Interval:  276 QTC Calculation: 411 R Axis:   82 Text Interpretation: Atrial fibrillation with rapid ventricular response Borderline right axis deviation Repolarization abnormality, prob rate related No significant change since last tracing Confirmed by Calvert Cantor [phone]) on 02/08/2020 10:48:37 PM   Radiology CT ABDOMEN PELVIS WO CONTRAST  Result Date: 02/09/2020 CLINICAL DATA:  Nausea and vomiting for 2 days. EXAM: CT ABDOMEN AND PELVIS WITHOUT CONTRAST TECHNIQUE: Multidetector CT imaging of the abdomen and pelvis was performed following the standard protocol without IV contrast. COMPARISON:  None. FINDINGS: Lower chest: Confluent airspace disease in both lower lobes, question aspiration in this setting. Cardiomegaly and coronary atherosclerosis. Hepatobiliary: No focal liver abnormality.Small layering calculi in the gallbladder. No right upper quadrant inflammatory changes. Pancreas: Unremarkable. Spleen: Unremarkable. Adrenals/Urinary Tract: Negative adrenals. No hydronephrosis or ureteral stone. Bilateral renal cystic densities. There is a subcentimeter hyperdense lesion on the left consistent with hemorrhagic cyst given the degree of density for size. Large medullary region stone in the interpolar left kidney measuring 8 mm. Unremarkable bladder. Stomach/Bowel: No obstruction or visible inflammation. Duodenal diverticulum. Vascular/Lymphatic: Heavily calcified aorta and iliacs with degree of calcified plaque suggesting bilateral common iliac stenosis. No mass or adenopathy. Reproductive:Unremarkable for age  Other: No ascites or pneumoperitoneum. Musculoskeletal: Osteopenia.  No acute abnormalities. IMPRESSION: 1. Negative for bowel obstruction or visible inflammation. 2. Extensive bilateral lower lobe airspace disease with history favoring aspiration. 3. Cholelithiasis and left nephrolithiasis. Electronically Signed   By: Monte Fantasia M.D.   On: 02/09/2020 04:29   DG Chest Port 1 View  Result Date: 02/09/2020 CLINICAL DATA:  86 year old female with shortness of breath. EXAM: PORTABLE CHEST 1 VIEW COMPARISON:  Chest radiograph dated 02/04/2020 and CT dated 09/06/2015. FINDINGS: Chronic interstitial coarsening and mild bronchitic changes. Right lung base nodular density, increased since the prior radiograph most consistent with developing infiltrate. Clinical correlation and follow-up to resolution recommended. No lobar consolidation, pleural effusion, pneumothorax. Mild cardiomegaly. Median sternotomy wires and CABG vascular clips. Atherosclerotic calcification of the aorta. No acute osseous pathology. IMPRESSION: Progressed right lung base nodular density most consistent with worsening pneumonia. Clinical correlation and follow-up to resolution recommended. Electronically Signed   By: Anner Crete M.D.   On: 02/09/2020 02:39    Procedures Procedures (including critical care time)  Medications Ordered in ED Medications  piperacillin-tazobactam (ZOSYN) IVPB 2.25 g (has no administration in time range)  metoprolol tartrate (LOPRESSOR) injection 5 mg (5 mg Intravenous Given 02/09/20 0042)    ED Course  I have reviewed the triage vital signs and the nursing notes.  Pertinent labs & imaging results that were available during my care of the patient were reviewed by me and considered in my medical decision making (see chart for details).    MDM Rules/Calculators/A&P                      Patient returns to the emergency department for evaluation of nausea and vomiting.  Patient was seen in the ER  several days ago with same.  Patient has reportedly continued to have symptoms at home.  She was noted to be tachycardic at arrival.  She  has a history of atrial fibrillation, on Eliquis.  She uses Lopressor for rate control.  She was given IV Lopressor with improvement of her heart rate.  She was noted to be tachypneic.  Chest x-ray showed right lung base nodular density concerning for pneumonia.  Patient also underwent CT scan of abdomen and pelvis to further evaluate her vomiting.  No intra-abdominal pathology but she is noted to have bilateral lower lobe airspace disease that is felt to be consistent with aspiration.  Patient administered IV Zosyn for treatment of aspiration and will be admitted to the hospital.    This patients CHA2DS2-VASc Score and unadjusted Ischemic Stroke Rate (% per year) is equal to 7.2 % stroke rate/year from a score of 5  Above score calculated as 1 point each if present [CHF, HTN, DM, Vascular=MI/PAD/Aortic Plaque, Age if 65-74, or Female] Above score calculated as 2 points each if present [Age > 75, or Stroke/TIA/TE]  CRITICAL CARE Performed by: Orpah Greek   Total critical care time: 30 minutes  Critical care time was exclusive of separately billable procedures and treating other patients.  Critical care was necessary to treat or prevent imminent or life-threatening deterioration.  Critical care was time spent personally by me on the following activities: development of treatment plan with patient and/or surrogate as well as nursing, discussions with consultants, evaluation of patient's response to treatment, examination of patient, obtaining history from patient or surrogate, ordering and performing treatments and interventions, ordering and review of laboratory studies, ordering and review of radiographic studies, pulse oximetry and re-evaluation of patient's condition.  Final Clinical Impression(s) / ED Diagnoses Final diagnoses:  Aspiration  pneumonia of both lower lobes, unspecified aspiration pneumonia type ([place])  Atrial fibrillation with RVR ([place])    Rx / DC Orders ED Discharge Orders    None       Zollie Clemence, Gwenyth Allegra, MD 02/09/20 [phone]

## 2020-02-09 ENCOUNTER — Emergency Department (HOSPITAL_COMMUNITY): Payer: Medicare Other

## 2020-02-09 ENCOUNTER — Inpatient Hospital Stay (HOSPITAL_COMMUNITY): Payer: Medicare Other

## 2020-02-09 DIAGNOSIS — N200 Calculus of kidney: Secondary | ICD-10-CM | POA: Diagnosis not present

## 2020-02-09 DIAGNOSIS — I130 Hypertensive heart and chronic kidney disease with heart failure and stage 1 through stage 4 chronic kidney disease, or unspecified chronic kidney disease: Secondary | ICD-10-CM | POA: Diagnosis present

## 2020-02-09 DIAGNOSIS — M109 Gout, unspecified: Secondary | ICD-10-CM | POA: Diagnosis present

## 2020-02-09 DIAGNOSIS — J690 Pneumonitis due to inhalation of food and vomit: Secondary | ICD-10-CM

## 2020-02-09 DIAGNOSIS — I340 Nonrheumatic mitral (valve) insufficiency: Secondary | ICD-10-CM

## 2020-02-09 DIAGNOSIS — I2510 Atherosclerotic heart disease of native coronary artery without angina pectoris: Secondary | ICD-10-CM | POA: Diagnosis present

## 2020-02-09 DIAGNOSIS — J449 Chronic obstructive pulmonary disease, unspecified: Secondary | ICD-10-CM | POA: Diagnosis present

## 2020-02-09 DIAGNOSIS — N390 Urinary tract infection, site not specified: Secondary | ICD-10-CM | POA: Diagnosis not present

## 2020-02-09 DIAGNOSIS — R0602 Shortness of breath: Secondary | ICD-10-CM | POA: Diagnosis not present

## 2020-02-09 DIAGNOSIS — K8020 Calculus of gallbladder without cholecystitis without obstruction: Secondary | ICD-10-CM | POA: Diagnosis not present

## 2020-02-09 DIAGNOSIS — I350 Nonrheumatic aortic (valve) stenosis: Secondary | ICD-10-CM

## 2020-02-09 DIAGNOSIS — Z20822 Contact with and (suspected) exposure to covid-19: Secondary | ICD-10-CM | POA: Diagnosis present

## 2020-02-09 DIAGNOSIS — Z87891 Personal history of nicotine dependence: Secondary | ICD-10-CM | POA: Diagnosis not present

## 2020-02-09 DIAGNOSIS — A419 Sepsis, unspecified organism: Secondary | ICD-10-CM

## 2020-02-09 DIAGNOSIS — I480 Paroxysmal atrial fibrillation: Secondary | ICD-10-CM | POA: Diagnosis present

## 2020-02-09 DIAGNOSIS — N184 Chronic kidney disease, stage 4 (severe): Secondary | ICD-10-CM

## 2020-02-09 DIAGNOSIS — Z7901 Long term (current) use of anticoagulants: Secondary | ICD-10-CM | POA: Diagnosis not present

## 2020-02-09 DIAGNOSIS — Z79899 Other long term (current) drug therapy: Secondary | ICD-10-CM | POA: Diagnosis not present

## 2020-02-09 DIAGNOSIS — I5022 Chronic systolic (congestive) heart failure: Secondary | ICD-10-CM

## 2020-02-09 DIAGNOSIS — Z951 Presence of aortocoronary bypass graft: Secondary | ICD-10-CM | POA: Diagnosis not present

## 2020-02-09 DIAGNOSIS — E785 Hyperlipidemia, unspecified: Secondary | ICD-10-CM | POA: Diagnosis present

## 2020-02-09 DIAGNOSIS — I4891 Unspecified atrial fibrillation: Secondary | ICD-10-CM

## 2020-02-09 HISTORY — DX: Chronic systolic (congestive) heart failure: I50.22

## 2020-02-09 LAB — URINALYSIS, COMPLETE (UACMP) WITH MICROSCOPIC
Bilirubin Urine: NEGATIVE
Glucose, UA: NEGATIVE mg/dL
Ketones, ur: NEGATIVE mg/dL
Nitrite: NEGATIVE
Protein, ur: NEGATIVE mg/dL
Specific Gravity, Urine: 1.013 (ref 1.005–1.030)
WBC, UA: >50 WBC/hpf — ABNORMAL HIGH (ref 0–5)
pH: 5 (ref 5.0–8.0)

## 2020-02-09 LAB — RESPIRATORY PANEL BY RT PCR (FLU A&B, COVID)
Influenza A by PCR: NEGATIVE
Influenza B by PCR: NEGATIVE
SARS Coronavirus 2 by RT PCR: NEGATIVE

## 2020-02-09 LAB — ECHOCARDIOGRAM COMPLETE
Height: 63 in
Weight: 1873.03 oz

## 2020-02-09 LAB — LACTIC ACID, PLASMA: Lactic Acid, Venous: 2.1 mmol/L (ref 0.5–1.9)

## 2020-02-09 MED ORDER — METOPROLOL TARTRATE 5 MG/5ML IV SOLN
5.0000 mg | Freq: Once | INTRAVENOUS | Status: AC
  Administered 2020-02-09: 01:00:00 5 mg via INTRAVENOUS
  Filled 2020-02-09: qty 5

## 2020-02-09 MED ORDER — SODIUM CHLORIDE 0.9 % IV SOLN: 250.0000 mL | INTRAVENOUS | Status: DC | PRN

## 2020-02-09 MED ORDER — SODIUM CHLORIDE 0.9 % IV SOLN: INTRAVENOUS | Status: AC

## 2020-02-09 MED ORDER — AMLODIPINE BESYLATE 5 MG PO TABS
5.0000 mg | ORAL_TABLET | Freq: Every day | ORAL | Status: DC
  Filled 2020-02-09: qty 1

## 2020-02-09 MED ORDER — CITALOPRAM HYDROBROMIDE 20 MG PO TABS
20.0000 mg | ORAL_TABLET | Freq: Every day | ORAL | Status: DC
  Administered 2020-02-09 – 2020-02-11 (×3): 20 mg via ORAL
  Filled 2020-02-09 (×3): qty 1

## 2020-02-09 MED ORDER — PIPERACILLIN-TAZOBACTAM IN DEX 2-0.25 GM/50ML IV SOLN
2.2500 g | Freq: Four times a day (QID) | INTRAVENOUS | Status: DC
  Filled 2020-02-09 (×8): qty 50

## 2020-02-09 MED ORDER — APIXABAN 2.5 MG PO TABS
2.5000 mg | ORAL_TABLET | Freq: Two times a day (BID) | ORAL | Status: DC
  Administered 2020-02-09 – 2020-02-11 (×5): 2.5 mg via ORAL
  Filled 2020-02-09 (×5): qty 1

## 2020-02-09 MED ORDER — ONDANSETRON HCL 4 MG PO TABS: 4.0000 mg | ORAL_TABLET | Freq: Four times a day (QID) | ORAL | Status: DC | PRN

## 2020-02-09 MED ORDER — AZITHROMYCIN 250 MG PO TABS
500.0000 mg | ORAL_TABLET | Freq: Every day | ORAL | Status: DC
  Administered 2020-02-09 – 2020-02-11 (×3): 500 mg via ORAL
  Filled 2020-02-09 (×3): qty 2

## 2020-02-09 MED ORDER — SODIUM CHLORIDE 0.9% FLUSH
3.0000 mL | INTRAVENOUS | Status: DC | PRN
  Administered 2020-02-09: 10:00:00 3 mL via INTRAVENOUS

## 2020-02-09 MED ORDER — PIPERACILLIN-TAZOBACTAM 3.375 G IVPB
3.3750 g | Freq: Two times a day (BID) | INTRAVENOUS | Status: DC
  Administered 2020-02-09 – 2020-02-11 (×5): 3.375 g via INTRAVENOUS
  Filled 2020-02-09 (×4): qty 50

## 2020-02-09 MED ORDER — EZETIMIBE 10 MG PO TABS
10.0000 mg | ORAL_TABLET | Freq: Every day | ORAL | Status: DC
  Administered 2020-02-09 – 2020-02-11 (×3): 10 mg via ORAL
  Filled 2020-02-09 (×3): qty 1

## 2020-02-09 MED ORDER — METOPROLOL TARTRATE 25 MG PO TABS
25.0000 mg | ORAL_TABLET | Freq: Two times a day (BID) | ORAL | Status: DC
  Administered 2020-02-09 – 2020-02-11 (×5): 25 mg via ORAL
  Filled 2020-02-09 (×5): qty 1

## 2020-02-09 MED ORDER — ONDANSETRON HCL 4 MG/2ML IJ SOLN
4.0000 mg | Freq: Four times a day (QID) | INTRAMUSCULAR | Status: DC
  Administered 2020-02-09 – 2020-02-10 (×3): 4 mg via INTRAVENOUS
  Filled 2020-02-09 (×6): qty 2

## 2020-02-09 MED ORDER — SODIUM CHLORIDE 0.9% FLUSH
3.0000 mL | Freq: Two times a day (BID) | INTRAVENOUS | Status: DC
  Administered 2020-02-09 – 2020-02-10 (×2): 3 mL via INTRAVENOUS

## 2020-02-09 MED ORDER — ATORVASTATIN CALCIUM 40 MG PO TABS
40.0000 mg | ORAL_TABLET | Freq: Every evening | ORAL | Status: DC
  Administered 2020-02-09 – 2020-02-10 (×2): 40 mg via ORAL
  Filled 2020-02-09 (×2): qty 1

## 2020-02-09 MED ORDER — APIXABAN 5 MG PO TABS: 5.0000 mg | ORAL_TABLET | Freq: Two times a day (BID) | ORAL | Status: DC

## 2020-02-09 MED ORDER — ONDANSETRON HCL 4 MG/2ML IJ SOLN
4.0000 mg | Freq: Four times a day (QID) | INTRAMUSCULAR | Status: DC | PRN
  Administered 2020-02-10: 17:00:00 4 mg via INTRAVENOUS

## 2020-02-09 NOTE — Progress Notes (Signed)
PHARMACIST - PHYSICIAN COMMUNICATION  CONCERNING:  Eliquis  DESCRIPTION: Pt is on Eliquis 5mg  PO BID for Afib; pt meets criteria for dose reduction to 2.5mg  BID (age>26yr, wt<60kg, and SCr >1.5mg /dl).  PTA dose has been verified, but please review for appropriateness; 5mg  BID would be the dose for VTE but no VTE noted in chart.   Thank you. Wynona Neat, PharmD, BCPS 02/09/2020 7:14 AM

## 2020-02-09 NOTE — H&P (Signed)
TRH H&P    Patient Demographics:    Laura Jacobs, is a 86 y.o. female  MRN: [id number]  DOB - [birth year]  Admit Date - 02/08/2020  Referring MD/NP/PA: Adriana Mccallum  Outpatient Primary MD for the patient is [name], FNP  Patient coming from: Home  Chief complaint-nausea, vomiting   HPI:    Laura Jacobs  is a 86 y.o. female, CAD, hyperlipidemia, hypertension, atrial fibrillation on anticoagulation with Eliquis initially came to hospital with complaints of nausea vomiting and diarrhea.  Patient was found to be tachycardic on arrival she was given IV Lopressor with improvement in heart rate.  She was also noted to be tachypneic.  Chest x-ray showed right lung base nodular density concerning for pneumonia.  CT of the abdomen pelvis was done which showed bilateral lower lobe airspace disease consistent with aspiration. Patient was started on IV Zosyn for aspiration pneumonia. She denies chest pain Denies abdominal pain or dysuria     Review of systems:    In addition to the HPI above,  .  All other systems reviewed and are negative.    Past History of the following :    Past Medical History:  Diagnosis Date  . Acute MI ([place])   . Atrial fibrillation, unspecified   . CAD (coronary artery disease)   . Gout   . Hyperlipidemia   . Hypertension       Past Surgical History:  Procedure Laterality Date  . heart by pass        Social History:      Social History   Tobacco Use  . Smoking status: Former Smoker    Packs/day: 0.50    Years: 60.00    Pack years: 30.00    Types: Cigarettes    Quit date: 08/07/2014    Years since quitting: 5.5  . Smokeless tobacco: Former Systems developer    Quit date: 05/11/2014  Substance Use Topics  . Alcohol use: No       Family History :     Family History  Problem Relation Age of Onset  . Heart disease Mother   . Alcohol abuse Father    . Alzheimer's disease Sister   . Cancer Sister       Home Medications:   Prior to Admission medications   Medication Sig Start Date End Date Taking? Authorizing Provider  amLODipine (NORVASC) 5 MG tablet Take 1 tablet (5 mg total) by mouth daily. 09/21/19   Hassell Done Mary-Margaret, FNP  apixaban (ELIQUIS) 5 MG TABS tablet Take 1 tablet (5 mg total) by mouth 2 (two) times daily. 09/21/19   Hassell Done, Mary-Margaret, FNP  atorvastatin (LIPITOR) 40 MG tablet TAKE 1 TABLET ONCE DAILY IN THE EVENING Patient taking differently: Take 40 mg by mouth every evening.  09/21/19   Hassell Done, Mary-Margaret, FNP  calcium-vitamin D (OSCAL 500/200 D-3) 500-200 MG-UNIT per tablet Take 1 tablet by mouth 2 (two) times daily. 02/27/15   Cherre Robins, PharmD  citalopram (CELEXA) 20 MG tablet Take 1 tablet (20 mg total) by mouth daily. 11/15/19  Hassell Done, Mary-Margaret, FNP  ezetimibe (ZETIA) 10 MG tablet Take 1 tablet (10 mg total) by mouth daily. 09/21/19   Hassell Done, Mary-Margaret, FNP  furosemide (LASIX) 40 MG tablet Take 1 tablet (40 mg total) by mouth daily. 09/21/19   Hassell Done, Mary-Margaret, FNP  metoprolol tartrate (LOPRESSOR) 25 MG tablet TAKE  (1)  TABLET TWICE A DAY. Patient taking differently: Take 25 mg by mouth 2 (two) times daily.  09/21/19   Hassell Done, Mary-Margaret, FNP  NITROSTAT 0.4 MG SL tablet PLACE 1 TABLET UNDER THE TONGUE EVERY 5 MINUTES AS NEEDED FOR CHEST PA IN Patient taking differently: Place 0.4 mg under the tongue every 5 (five) minutes as needed for chest pain.  11/09/15   Hassell Done, Mary-Margaret, FNP  potassium chloride SA (KLOR-CON M20) 20 MEQ tablet Take 1 tablet (20 mEq total) by mouth 2 (two) times daily. 09/21/19   Hassell Done Mary-Margaret, FNP     Allergies:    No Known Allergies   Physical Exam:   Vitals  Blood pressure 97/63, pulse 83, temperature 99.5 F (37.5 C), temperature source Oral, resp. rate (!) 22, height 5\' 3"  (1.6 m), weight 57 kg, SpO2 98 %.  1.  General: Appears in no  acute distress  2. Psychiatric: Alert, oriented x3, intact insight and judgment  3. Neurologic: Cranial nerves II through XII grossly intact, no focal deficit noted  4. HEENMT:  Atraumatic normocephalic, extraocular muscles are intact  5. Respiratory : Clear to auscultation bilaterally, no wheezing or crackles auscultated  6. Cardiovascular : S1-S2, regular, no murmur auscultated, no edema in the lower extremities, peripheral pulses palpable  7. Gastrointestinal:  Abdomen is soft, nontender, no organomegaly      Data Review:    CBC Recent Labs  Lab 02/04/20 2216 02/08/20 2257  WBC 17.8* 15.3*  HGB 11.5* 11.7*  HCT 35.1* 36.4  PLT 284 314  MCV 95.1 95.3  MCH 31.2 30.6  MCHC 32.8 32.1  RDW 13.6 13.7  LYMPHSABS 1.0  --   MONOABS 1.4*  --   EOSABS 0.1  --   BASOSABS 0.1  --    ------------------------------------------------------------------------------------------------------------------  Results for orders placed or performed during the hospital encounter of 02/08/20 (from the past 48 hour(s))  Lipase, blood     Status: None   Collection Time: 02/08/20 10:57 PM  Result Value Ref Range   Lipase 28 11 - 51 U/L    Comment: Performed at [facility], [address], [place], [place] [address]  Comprehensive metabolic panel     Status: Abnormal   Collection Time: 02/08/20 10:57 PM  Result Value Ref Range   Sodium 134 (L) 135 - 145 mmol/L   Potassium 4.1 3.5 - 5.1 mmol/L   Chloride 100 98 - 111 mmol/L   CO2 22 22 - 32 mmol/L   Glucose, Bld 112 (H) 70 - 99 mg/dL    Comment: Glucose reference range applies only to samples taken after fasting for at least 8 hours.   BUN 33 (H) 8 - 23 mg/dL   Creatinine, Ser 1.98 (H) 0.44 - 1.00 mg/dL   Calcium 8.5 (L) 8.9 - 10.3 mg/dL   Total Protein 7.4 6.5 - 8.1 g/dL   Albumin 3.6 3.5 - 5.0 g/dL   AST 24 15 - 41 U/L   ALT 16 0 - 44 U/L   Alkaline Phosphatase 71 38 - 126 U/L   Total Bilirubin 1.2 0.3 - 1.2 mg/dL   GFR calc  non Af Amer 23 (L) >60 mL/min  GFR calc Af Amer 26 (L) >60 mL/min   Anion gap 12 5 - 15    Comment: Performed at [facility], [address] [place], [place] [address]  CBC     Status: Abnormal   Collection Time: 02/08/20 10:57 PM  Result Value Ref Range   WBC 15.3 (H) 4.0 - 10.5 K/uL   RBC 3.82 (L) 3.87 - 5.11 MIL/uL   Hemoglobin 11.7 (L) 12.0 - 15.0 g/dL   HCT 36.4 36.0 - 46.0 %   MCV 95.3 80.0 - 100.0 fL   MCH 30.6 26.0 - 34.0 pg   MCHC 32.1 30.0 - 36.0 g/dL   RDW 13.7 11.5 - 15.5 %   Platelets 314 150 - 400 K/uL   nRBC 0.0 0.0 - 0.2 %    Comment: Performed at [facility], [address], [place], [place] [address]  Lactic acid, plasma     Status: Abnormal   Collection Time: 02/08/20 10:57 PM  Result Value Ref Range   Lactic Acid, Venous 2.1 (HH) 0.5 - 1.9 mmol/L    Comment: CRITICAL RESULT CALLED TO, READ BACK BY AND VERIFIED WITH: [place],K AT 5:10AM ON 02/09/20 BY [facility] Performed at [facility], [address] [place],  [address]     Chemistries  Recent Labs  Lab 02/04/20 2216 02/08/20 2257  NA 134* 134*  K 3.7 4.1  CL 99 100  CO2 24 22  GLUCOSE 114* 112*  BUN 26* 33*  CREATININE 2.22* 1.98*  CALCIUM 9.1 8.5*  MG 1.3*  --   AST 22 24  ALT 13 16  ALKPHOS 71 71  BILITOT 1.5* 1.2   ------------------------------------------------------------------------------------------------------------------  ------------------------------------------------------------------------------------------------------------------ GFR: Estimated Creatinine Clearance: 17.8 mL/min (A) (by C-G formula based on SCr of 1.98 mg/dL (H)). Liver Function Tests: Recent Labs  Lab 02/04/20 2216 02/08/20 2257  AST 22 24  ALT 13 16  ALKPHOS 71 71  BILITOT 1.5* 1.2  PROT 7.4 7.4  ALBUMIN 3.6 3.6   Recent Labs  Lab 02/04/20 2216 02/08/20 2257  LIPASE 25 28   No results for input(s): AMMONIA in the last 168 hours. Coagulation Profile: Recent Labs  Lab  02/04/20 2216  INR 2.3*    --------------------------------------------------------------------------------------------------------------- Urine analysis:    Component Value Date/Time   COLORURINE YELLOW 05/14/2016 2033   APPEARANCEUR CLEAR 05/14/2016 2033   LABSPEC 1.010 05/14/2016 2033   PHURINE 6.0 05/14/2016 2033   GLUCOSEU NEGATIVE 05/14/2016 2033   HGBUR TRACE (A) 05/14/2016 2033   BILIRUBINUR NEGATIVE 05/14/2016 2033   BILIRUBINUR NEG 01/05/2013 1106   KETONESUR NEGATIVE 05/14/2016 2033   PROTEINUR NEGATIVE 05/14/2016 2033   UROBILINOGEN 1.0 08/23/2014 0001   NITRITE NEGATIVE 05/14/2016 2033   LEUKOCYTESUR SMALL (A) 05/14/2016 2033      Imaging Results:    CT ABDOMEN PELVIS WO CONTRAST  Result Date: 02/09/2020 CLINICAL DATA:  Nausea and vomiting for 2 days. EXAM: CT ABDOMEN AND PELVIS WITHOUT CONTRAST TECHNIQUE: Multidetector CT imaging of the abdomen and pelvis was performed following the standard protocol without IV contrast. COMPARISON:  None. FINDINGS: Lower chest: Confluent airspace disease in both lower lobes, question aspiration in this setting. Cardiomegaly and coronary atherosclerosis. Hepatobiliary: No focal liver abnormality.Small layering calculi in the gallbladder. No right upper quadrant inflammatory changes. Pancreas: Unremarkable. Spleen: Unremarkable. Adrenals/Urinary Tract: Negative adrenals. No hydronephrosis or ureteral stone. Bilateral renal cystic densities. There is a subcentimeter hyperdense lesion on the left consistent with hemorrhagic cyst given the degree of density for size. Large medullary region  stone in the interpolar left kidney measuring 8 mm. Unremarkable bladder. Stomach/Bowel: No obstruction or visible inflammation. Duodenal diverticulum. Vascular/Lymphatic: Heavily calcified aorta and iliacs with degree of calcified plaque suggesting bilateral common iliac stenosis. No mass or adenopathy. Reproductive:Unremarkable for age Other: No ascites or  pneumoperitoneum. Musculoskeletal: Osteopenia.  No acute abnormalities. IMPRESSION: 1. Negative for bowel obstruction or visible inflammation. 2. Extensive bilateral lower lobe airspace disease with history favoring aspiration. 3. Cholelithiasis and left nephrolithiasis. Electronically Signed   By: Monte Fantasia M.D.   On: 02/09/2020 04:29   DG Chest Port 1 View  Result Date: 02/09/2020 CLINICAL DATA:  86 year old female with shortness of breath. EXAM: PORTABLE CHEST 1 VIEW COMPARISON:  Chest radiograph dated 02/04/2020 and CT dated 09/06/2015. FINDINGS: Chronic interstitial coarsening and mild bronchitic changes. Right lung base nodular density, increased since the prior radiograph most consistent with developing infiltrate. Clinical correlation and follow-up to resolution recommended. No lobar consolidation, pleural effusion, pneumothorax. Mild cardiomegaly. Median sternotomy wires and CABG vascular clips. Atherosclerotic calcification of the aorta. No acute osseous pathology. IMPRESSION: Progressed right lung base nodular density most consistent with worsening pneumonia. Clinical correlation and follow-up to resolution recommended. Electronically Signed   By: Anner Crete M.D.   On: 02/09/2020 02:39    My personal review of EKG: Rhythm atrial fibrillation   Assessment & Plan:    Active Problems:   Aspiration pneumonia ([place])   1. Aspiration pneumonia-patient initially presented with nausea vomiting and diarrhea.  Found to be tachypneic, chest x-ray concerning for pneumonia.  CT abdomen/pelvis confirms bilateral airspace disease concerning for aspiration.  Patient started on IV Zosyn.  Will obtain swallow evaluation in a.m.  We will keep patient n.p.o.  Hold all p.o. medications till swallow evaluation is complete. 2. Atrial fibrillation with RVR-patient was found to be in A. fib with RVR at the time of presentation.  Heart rate improved with IV metoprolol.  Continue metoprolol for rate  control.  Eliquis for anticoagulation. 3. Hyperlipidemia-continue Lipitor, Zetia 4. Hypertension-continue Norvasc, metoprolol 5. Chronic systolic CHF-last echo from 2017 showed EF 45%.  Currently euvolemic.  Hold Lasix at this time.   DVT Prophylaxis-   Eliquis  AM Labs Ordered, also please review Full Orders  Family Communication: Admission, patients condition and plan of care including tests being ordered have been discussed with the patient  who indicate understanding and agree with the plan and Code Status.  Code Status: Full code  Admission status: Inpatient :The appropriate admission status for this patient is INPATIENT. Inpatient status is judged to be reasonable and necessary in order to provide the required intensity of service to ensure the patient's safety. The patient's presenting symptoms, physical exam findings, and initial radiographic and laboratory data in the context of their chronic comorbidities is felt to place them at high risk for further clinical deterioration. Furthermore, it is not anticipated that the patient will be medically stable for discharge from the hospital within 2 midnights of admission. The following factors support the admission status of inpatient.     The patient's presenting symptoms include .  Nausea vomiting The worrisome physical exam findings include pneumonia. The initial radiographic and laboratory data are worrisome because of pneumonia. The chronic co-morbidities include atrial fibrillation.       * I certify that at the point of admission it is my clinical judgment that the patient will require inpatient hospital care spanning beyond 2 midnights from the point of admission due to high intensity of service, high risk  for further deterioration and high frequency of surveillance required.*  Time spent in minutes : 60 minutes   Mar Zettler S Jo-Ann Johanning M.D

## 2020-02-09 NOTE — Progress Notes (Signed)
Pharmacy Antibiotic Note  Laura Jacobs is a 86 y.o. female admitted on 02/08/2020 with pneumonia.  Pharmacy has been consulted for zosyn dosing.  Plan: Zosyn 2.25 gm IV q8 hours F/u cultues and clinical course  Height: 5\' 3"  (160 cm) Weight: 57 kg (125 lb 10.6 oz) IBW/kg (Calculated) : 52.4  Temp (24hrs), Avg:99.5 F (37.5 C), Min:99.5 F (37.5 C), Max:99.5 F (37.5 C)  Recent Labs  Lab 02/04/20 2216 02/08/20 2257  WBC 17.8* 15.3*  CREATININE 2.22* 1.98*    Estimated Creatinine Clearance: 17.8 mL/min (A) (by C-G formula based on SCr of 1.98 mg/dL (H)).    No Known Allergies  Thank you for allowing pharmacy to be a part of this patient's care.  Excell Seltzer Poteet 02/09/2020 4:47 AM

## 2020-02-09 NOTE — Progress Notes (Signed)
PROGRESS NOTE  Laura Jacobs KDX:[id number] DOB: 1934/04/16 DOA: 02/08/2020 PCP: [name], FNP  Brief History:  86 year old female with a history of atrial fibrillation on apixaban, coronary disease, hyperlipidemia, CKD stage IV, hypertension presenting with nausea and dry heaving for the past 2 to 3 days.  Unfortunately, the patient is a poor historian.  The patient's son with whom she lives is also a poor historian.  Nevertheless, she has been complaining of some shortness of breath and a nonproductive cough.  She denied any diarrhea, hematochezia, chest pain, abdominal pain, dysuria, hematuria.  She denies any new medications.  She denies any Covid contacts. In the emergency department, the patient had low-grade temperature of 99.5 F.  She has soft blood pressures.  The patient was noted to have atrial fibrillation with RVR.  She was given IV Lopressor with improvement.  Chest x-ray showed right basilar nodular density.  CT of the abdomen and pelvis showed confluent airspace disease in the bilateral lower lobes with cholelithiasis.  The patient was started on Zosyn.  Assessment/Plan: Sepsis -Present on admission -Presented with lactic acid 2.1 and WBC 15.3 -Secondary to pneumonia -Judicious IV fluids for 24 hours -Continue Zosyn  Intractable nausea and vomiting -start clear liquid diet -Continue Zofran -Advance diet as tolerated  Aspiration pneumonia -Continue Zosyn  Chronic systolic CHF -9/76/7341 echo--EF 45%, no WMA, moderate TR -Appears clinically euvolemic -Judicious IV fluids  Atrial fibrillation, type unspecified with RVR -Currently rate controlled -Repeat echo -Discontinue amlodipine given suppressed EF -Continue apixaban -Continue metoprolol tartrate  Hyperlipidemia -Continue statin and Zetia  COPD -Patient has over 50-pack-year history of tobacco, quit 11 years ago -Stable on room air    Disposition Plan: Patient From:  Home D/C Place: Home - 2-3  Days Barriers: Not Clinically Stable--remains on IV abx due to pneumonia with leukocytosis.  Not tolerating diet  Family Communication:   Son updated at bedside  Consultants:  none  Code Status:  FULL   DVT Prophylaxis: apixaban   Procedures: As Listed in Progress Note Above  Antibiotics: Zosyn 5/4>>> azithro 5/5>>      Subjective: Patient is feeling better this morning.  She still feels weak.  She states her breathing and coughing are improving.  She denies any hemoptysis, fevers chills, chest pain, abdominal pain, diarrhea.  There is no dysuria or hematuria.  Objective: Vitals:   02/09/20 0500 02/09/20 0530 02/09/20 0600 02/09/20 0650  BP: 97/63 95/61 99/61  119/73  Pulse: 83 91 85 100  Resp: (!) 22 18 19 16   Temp:    97.8 F (36.6 C)  TempSrc:    Oral  SpO2: 98% 98% 100% 100%  Weight:    53.1 kg  Height:    5\' 3"  (1.6 m)   No intake or output data in the 24 hours ending 02/09/20 1023 Weight change:  Exam:   General:  Pt is alert, follows commands appropriately, not in acute distress  HEENT: No icterus, No thrush, No neck mass, [place]/AT  Cardiovascular: RRR, S1/S2, no rubs, no gallops  Respiratory: Bibasilar crackles.  No wheezing.  Good air movement.  Abdomen: Soft/+BS, non tender, non distended, no guarding  Extremities: No edema, No lymphangitis, No petechiae, No rashes, no synovitis   Data Reviewed: I have personally reviewed following labs and imaging studies Basic Metabolic Panel: Recent Labs  Lab 02/04/20 2216 02/08/20 2257  NA 134* 134*  K 3.7 4.1  CL 99 100  CO2 24  22  GLUCOSE 114* 112*  BUN 26* 33*  CREATININE 2.22* 1.98*  CALCIUM 9.1 8.5*  MG 1.3*  --    Liver Function Tests: Recent Labs  Lab 02/04/20 2216 02/08/20 2257  AST 22 24  ALT 13 16  ALKPHOS 71 71  BILITOT 1.5* 1.2  PROT 7.4 7.4  ALBUMIN 3.6 3.6   Recent Labs  Lab 02/04/20 2216 02/08/20 2257  LIPASE 25 28   No results for  input(s): AMMONIA in the last 168 hours. Coagulation Profile: Recent Labs  Lab 02/04/20 2216  INR 2.3*   CBC: Recent Labs  Lab 02/04/20 2216 02/08/20 2257  WBC 17.8* 15.3*  NEUTROABS 15.2*  --   HGB 11.5* 11.7*  HCT 35.1* 36.4  MCV 95.1 95.3  PLT 284 314   Cardiac Enzymes: No results for input(s): CKTOTAL, CKMB, CKMBINDEX, TROPONINI in the last 168 hours. BNP: Invalid input(s): POCBNP CBG: No results for input(s): GLUCAP in the last 168 hours. HbA1C: No results for input(s): HGBA1C in the last 72 hours. Urine analysis:    Component Value Date/Time   COLORURINE YELLOW 05/14/2016 2033   APPEARANCEUR CLEAR 05/14/2016 2033   LABSPEC 1.010 05/14/2016 2033   PHURINE 6.0 05/14/2016 2033   GLUCOSEU NEGATIVE 05/14/2016 2033   HGBUR TRACE (A) 05/14/2016 2033   BILIRUBINUR NEGATIVE 05/14/2016 2033   BILIRUBINUR NEG 01/05/2013 1106   KETONESUR NEGATIVE 05/14/2016 2033   PROTEINUR NEGATIVE 05/14/2016 2033   UROBILINOGEN 1.0 08/23/2014 0001   NITRITE NEGATIVE 05/14/2016 2033   LEUKOCYTESUR SMALL (A) 05/14/2016 2033   Sepsis Labs: @LABRCNTIP (procalcitonin:4,lacticidven:4) ) Recent Results (from the past 240 hour(s))  Respiratory Panel by RT PCR (Flu A&B, Covid) - Nasopharyngeal Swab     Status: None   Collection Time: 02/09/20  4:50 AM   Specimen: Nasopharyngeal Swab  Result Value Ref Range Status   SARS Coronavirus 2 by RT PCR NEGATIVE NEGATIVE Final    Comment: (NOTE) SARS-CoV-2 target nucleic acids are NOT DETECTED. The SARS-CoV-2 RNA is generally detectable in upper respiratoy specimens during the acute phase of infection. The lowest concentration of SARS-CoV-2 viral copies this assay can detect is 131 copies/mL. A negative result does not preclude SARS-Cov-2 infection and should not be used as the sole basis for treatment or other patient management decisions. A negative result may occur with  improper specimen collection/handling, submission of specimen other than  nasopharyngeal swab, presence of viral mutation(s) within the areas targeted by this assay, and inadequate number of viral copies (<131 copies/mL). A negative result must be combined with clinical observations, patient history, and epidemiological information. The expected result is Negative. Fact Sheet for Patients:  PinkCheek.be Fact Sheet for Healthcare Providers:  GravelBags.it This test is not yet ap proved or cleared by the [place] FDA and  has been authorized for detection and/or diagnosis of SARS-CoV-2 by FDA under an Emergency Use Authorization (EUA). This EUA will remain  in effect (meaning this test can be used) for the duration of the COVID-19 declaration under Section 564(b)(1) of the Act, 21 U.S.C. section 360bbb-3(b)(1), unless the authorization is terminated or revoked sooner.    Influenza A by PCR NEGATIVE NEGATIVE Final   Influenza B by PCR NEGATIVE NEGATIVE Final    Comment: (NOTE) The Xpert Xpress SARS-CoV-2/FLU/RSV assay is intended as an aid in  the diagnosis of influenza from Nasopharyngeal swab specimens and  should not be used as a sole basis for treatment. Nasal washings and  aspirates are unacceptable for Xpert Xpress SARS-CoV-2/FLU/RSV  testing. Fact Sheet for Patients: PinkCheek.be Fact Sheet for Healthcare Providers: GravelBags.it This test is not yet approved or cleared by the [place] FDA and  has been authorized for detection and/or diagnosis of SARS-CoV-2 by  FDA under an Emergency Use Authorization (EUA). This EUA will remain  in effect (meaning this test can be used) for the duration of the  Covid-19 declaration under Section 564(b)(1) of the Act, 21  U.S.C. section 360bbb-3(b)(1), unless the authorization is  terminated or revoked. Performed at [facility], [address] [place], [place] [address]   Culture, blood  (Routine X 2) w Reflex to ID Panel     Status: None (Preliminary result)   Collection Time: 02/09/20  5:23 AM   Specimen: BLOOD RIGHT ARM  Result Value Ref Range Status   Specimen Description BLOOD RIGHT ARM  Final   Special Requests   Final    BOTTLES DRAWN AEROBIC AND ANAEROBIC Blood Culture adequate volume   Culture   Final    NO GROWTH < 12 HOURS Performed at [facility], [address], [place], [place] [address]    Report Status PENDING  Incomplete  Culture, blood (Routine X 2) w Reflex to ID Panel     Status: None (Preliminary result)   Collection Time: 02/09/20  5:33 AM   Specimen: BLOOD RIGHT ARM  Result Value Ref Range Status   Specimen Description BLOOD RIGHT ARM  Final   Special Requests   Final    BOTTLES DRAWN AEROBIC AND ANAEROBIC Blood Culture adequate volume   Culture   Final    NO GROWTH < 12 HOURS Performed at [facility], [address] [place], [place] [address]    Report Status PENDING  Incomplete     Scheduled Meds: . amLODipine  5 mg Oral Daily  . apixaban  2.5 mg Oral BID  . atorvastatin  40 mg Oral QPM  . citalopram  20 mg Oral Daily  . ezetimibe  10 mg Oral Daily  . metoprolol tartrate  25 mg Oral BID  . sodium chloride flush  3 mL Intravenous Q12H   Continuous Infusions: . sodium chloride    . piperacillin-tazobactam (ZOSYN)  IV 3.375 g (02/09/20 0946)    Procedures/Studies: CT ABDOMEN PELVIS WO CONTRAST  Result Date: 02/09/2020 CLINICAL DATA:  Nausea and vomiting for 2 days. EXAM: CT ABDOMEN AND PELVIS WITHOUT CONTRAST TECHNIQUE: Multidetector CT imaging of the abdomen and pelvis was performed following the standard protocol without IV contrast. COMPARISON:  None. FINDINGS: Lower chest: Confluent airspace disease in both lower lobes, question aspiration in this setting. Cardiomegaly and coronary atherosclerosis. Hepatobiliary: No focal liver abnormality.Small layering calculi in the gallbladder. No right upper quadrant inflammatory changes.  Pancreas: Unremarkable. Spleen: Unremarkable. Adrenals/Urinary Tract: Negative adrenals. No hydronephrosis or ureteral stone. Bilateral renal cystic densities. There is a subcentimeter hyperdense lesion on the left consistent with hemorrhagic cyst given the degree of density for size. Large medullary region stone in the interpolar left kidney measuring 8 mm. Unremarkable bladder. Stomach/Bowel: No obstruction or visible inflammation. Duodenal diverticulum. Vascular/Lymphatic: Heavily calcified aorta and iliacs with degree of calcified plaque suggesting bilateral common iliac stenosis. No mass or adenopathy. Reproductive:Unremarkable for age Other: No ascites or pneumoperitoneum. Musculoskeletal: Osteopenia.  No acute abnormalities. IMPRESSION: 1. Negative for bowel obstruction or visible inflammation. 2. Extensive bilateral lower lobe airspace disease with history favoring aspiration. 3. Cholelithiasis and left nephrolithiasis. Electronically Signed   By: Monte Fantasia M.D.   On: 02/09/2020 04:29  DG Chest Port 1 View  Result Date: 02/09/2020 CLINICAL DATA:  86 year old female with shortness of breath. EXAM: PORTABLE CHEST 1 VIEW COMPARISON:  Chest radiograph dated 02/04/2020 and CT dated 09/06/2015. FINDINGS: Chronic interstitial coarsening and mild bronchitic changes. Right lung base nodular density, increased since the prior radiograph most consistent with developing infiltrate. Clinical correlation and follow-up to resolution recommended. No lobar consolidation, pleural effusion, pneumothorax. Mild cardiomegaly. Median sternotomy wires and CABG vascular clips. Atherosclerotic calcification of the aorta. No acute osseous pathology. IMPRESSION: Progressed right lung base nodular density most consistent with worsening pneumonia. Clinical correlation and follow-up to resolution recommended. Electronically Signed   By: Anner Crete M.D.   On: 02/09/2020 02:39   DG Chest Portable 1 View  Result Date:  02/04/2020 CLINICAL DATA:  Nausea, diaphoresis and weakness. EXAM: PORTABLE CHEST 1 VIEW COMPARISON:  September 08, 2017 FINDINGS: Multiple sternal wires and vascular clips are seen. Mild atelectasis and/or infiltrate is seen within the right lung base. There is no evidence of a pleural effusion or pneumothorax. The heart size and mediastinal contours are within normal limits. There is mild calcification of the aortic arch. Degenerative changes seen throughout the thoracic spine. IMPRESSION: 1. Evidence of prior median sternotomy/CABG. 2. Mild right basilar atelectasis and/or infiltrate. Electronically Signed   By: Virgina Norfolk M.D.   On: 02/04/2020 22:35    Orson Eva, DO  [facility]  If 7PM-7AM, please contact night-coverage [email or web address] Password [email or web address] 02/09/2020, 10:23 AM   LOS: 0 days

## 2020-02-09 NOTE — ED Notes (Signed)
Date and time results received: 02/09/20 0512 (use smartphrase ".now" to insert current time)  Test: lactic acid Critical Value: 2.1  Name of Provider Notified: Dr Betsey Holiday  Orders Received? Or Actions Taken?: Actions Taken: no orders received

## 2020-02-09 NOTE — Progress Notes (Signed)
*  PRELIMINARY RESULTS* Echocardiogram 2D Echocardiogram has been performed.  Laura Jacobs 02/09/2020, 12:25 PM

## 2020-02-09 NOTE — Evaluation (Signed)
Clinical/Bedside Swallow Evaluation Patient Details  Name: Laura Jacobs MRN: [id number] Date of Birth: November 13, 1933  Today's Date: 02/09/2020 Time: SLP Start Time (ACUTE ONLY): 0175 SLP Stop Time (ACUTE ONLY): 1241 SLP Time Calculation (min) (ACUTE ONLY): 26 min  Past Medical History:  Past Medical History:  Diagnosis Date  . Acute MI ([place])   . Atrial fibrillation, unspecified   . CAD (coronary artery disease)   . Gout   . Hyperlipidemia   . Hypertension    Past Surgical History:  Past Surgical History:  Procedure Laterality Date  . heart by pass     HPI:  Laura Jacobs  is a 86 y.o. female, CAD, hyperlipidemia, hypertension, atrial fibrillation on anticoagulation with Eliquis initially came to hospital with complaints of nausea vomiting and diarrhea.  Patient was found to be tachycardic on arrival she was given IV Lopressor with improvement in heart rate.  She was also noted to be tachypneic.  Chest x-ray showed right lung base nodular density concerning for pneumonia.  CT of the abdomen pelvis was done which showed bilateral lower lobe airspace disease consistent with aspiration. BSE requested.    Assessment / Plan / Recommendation Clinical Impression  Clinical Swallowing evaluation completed while Pt was sitting upright in bed; Pt reports no difficulty swallowing and son, present for evaluation confirms this report. Pt is currently on a clear liquid diet however with permission from MD, thin liquids, puree and solid textures were assessed - no overt s/sx of oropharyngeal dyphagia noted. SLP reviewed universal aspiration precuations with Pt and Pt' son. Pt reports a small epiode of emesis prior to admission and question possible episode of aspiration during this. Defer diet upgrade to physician however recommend D3/mech soft diet with thin liquid when Pt is appropriate for upgrade to solid tetures. There are no further ST needs noted at this time, ST to sign off. Thank  you, SLP Visit Diagnosis: Dysphagia, unspecified (R13.10)    Aspiration Risk  Mild aspiration risk    Diet Recommendation Dysphagia 3 (Mech soft);Thin liquid   Liquid Administration via: Cup;Straw Medication Administration: Whole meds with liquid Supervision: Patient able to self feed Compensations: Slow rate;Small sips/bites Postural Changes: Seated upright at 90 degrees;Remain upright for at least 30 minutes after po intake    Other  Recommendations Oral Care Recommendations: Oral care BID     Swallow Study   General Date of Onset: 02/08/20 HPI: Laura Jacobs  is a 86 y.o. female, CAD, hyperlipidemia, hypertension, atrial fibrillation on anticoagulation with Eliquis initially came to hospital with complaints of nausea vomiting and diarrhea.  Patient was found to be tachycardic on arrival she was given IV Lopressor with improvement in heart rate.  She was also noted to be tachypneic.  Chest x-ray showed right lung base nodular density concerning for pneumonia.  CT of the abdomen pelvis was done which showed bilateral lower lobe airspace disease consistent with aspiration. BSE requested.  Type of Study: Bedside Swallow Evaluation Previous Swallow Assessment: none in chart Diet Prior to this Study: Thin liquids Temperature Spikes Noted: No Respiratory Status: Room air History of Recent Intubation: No Behavior/Cognition: Alert;Cooperative;Pleasant mood Oral Cavity Assessment: Within Functional Limits Oral Care Completed by SLP: Recent completion by staff Oral Cavity - Dentition: Dentures, top;Dentures, bottom Vision: Functional for self-feeding Self-Feeding Abilities: Able to feed self Patient Positioning: Upright in bed Baseline Vocal Quality: Normal Volitional Cough: Strong Volitional Swallow: Able to elicit    Oral/Motor/Sensory Function Overall Oral Motor/Sensory Function: Within functional limits  Ice Chips     Thin Liquid Thin Liquid: Within functional limits     Nectar Thick Nectar Thick Liquid: Not tested   Honey Thick Honey Thick Liquid: Not tested   Puree Puree: Within functional limits   Solid     Solid: Within functional limits     Ronae Noell H. Roddie Mc, CCC-SLP Speech Language Pathologist  Wende Bushy 02/09/2020,12:56 PM

## 2020-02-10 DIAGNOSIS — N390 Urinary tract infection, site not specified: Secondary | ICD-10-CM

## 2020-02-10 LAB — CBC
HCT: 31.4 % — ABNORMAL LOW (ref 36.0–46.0)
Hemoglobin: 10.1 g/dL — ABNORMAL LOW (ref 12.0–15.0)
MCH: 31.1 pg (ref 26.0–34.0)
MCHC: 32.2 g/dL (ref 30.0–36.0)
MCV: 96.6 fL (ref 80.0–100.0)
Platelets: 209 10*3/uL (ref 150–400)
RBC: 3.25 MIL/uL — ABNORMAL LOW (ref 3.87–5.11)
RDW: 13.5 % (ref 11.5–15.5)
WBC: 15.1 10*3/uL — ABNORMAL HIGH (ref 4.0–10.5)
nRBC: 0 % (ref 0.0–0.2)

## 2020-02-10 LAB — COMPREHENSIVE METABOLIC PANEL
ALT: 12 U/L (ref 0–44)
AST: 14 U/L — ABNORMAL LOW (ref 15–41)
Albumin: 2.9 g/dL — ABNORMAL LOW (ref 3.5–5.0)
Alkaline Phosphatase: 61 U/L (ref 38–126)
Anion gap: 11 (ref 5–15)
BUN: 34 mg/dL — ABNORMAL HIGH (ref 8–23)
CO2: 26 mmol/L (ref 22–32)
Calcium: 8.5 mg/dL — ABNORMAL LOW (ref 8.9–10.3)
Chloride: 100 mmol/L (ref 98–111)
Creatinine, Ser: 1.88 mg/dL — ABNORMAL HIGH (ref 0.44–1.00)
GFR calc Af Amer: 28 mL/min — ABNORMAL LOW (ref >60)
GFR calc non Af Amer: 24 mL/min — ABNORMAL LOW (ref >60)
Glucose, Bld: 86 mg/dL (ref 70–99)
Potassium: 4.2 mmol/L (ref 3.5–5.1)
Sodium: 137 mmol/L (ref 135–145)
Total Bilirubin: 2 mg/dL — ABNORMAL HIGH (ref 0.3–1.2)
Total Protein: 6.1 g/dL — ABNORMAL LOW (ref 6.5–8.1)

## 2020-02-10 LAB — LEGIONELLA PNEUMOPHILA SEROGP 1 UR AG: L. pneumophila Serogp 1 Ur Ag: NEGATIVE

## 2020-02-10 MED ORDER — MELATONIN 3 MG PO TABS: 6.0000 mg | ORAL_TABLET | Freq: Every evening | ORAL | Status: DC | PRN

## 2020-02-10 NOTE — Progress Notes (Signed)
Has had one loose stool today and no nausea and vomiting.

## 2020-02-10 NOTE — Progress Notes (Signed)
PROGRESS NOTE  Laura Jacobs DZH:[id number] DOB: Jan 04, 1934 DOA: 02/08/2020 PCP: [name], FNP  Brief History:  86 year old female with a history of atrial fibrillation on apixaban, coronary disease, hyperlipidemia, CKD stage IV, hypertension presenting with nausea and dry heaving for the past 2 to 3 days.  Unfortunately, the patient is a poor historian.  The patient's son with whom she lives is also a poor historian.  Nevertheless, she has been complaining of some shortness of breath and a nonproductive cough.  She denied any diarrhea, hematochezia, chest pain, abdominal pain, dysuria, hematuria.  She denies any new medications.  She denies any Covid contacts. In the emergency department, the patient had low-grade temperature of 99.5 F.  She has soft blood pressures.  The patient was noted to have atrial fibrillation with RVR.  She was given IV Lopressor with improvement.  Chest x-ray showed right basilar nodular density.  CT of the abdomen and pelvis showed confluent airspace disease in the bilateral lower lobes with cholelithiasis.  The patient was started on Zosyn.  Assessment/Plan: Sepsis -Present on admission -Presented with lactic acid 2.1 and WBC 15.3 -Secondary to pneumonia and UTI -Judicious IV fluids for 24 hours -Continue Zosyn  Intractable nausea and vomiting -start clear liquid diet>>>advance to soft diet -Discontinue Zofran  Aspiration pneumonia -Continue Zosyn -stable on RA  UTI -UA >50 wbc -urine culture = GNR  Chronic systolic CHF -01/23/6221 echo--EF 45%, no WMA, moderate TR -Appears clinically euvolemic -Judicious IV fluids -02/09/20 Echo--Repeat ecEF 55%, mod TR, mild to mod MR  Atrial fibrillation, type unspecified with RVR -Currently rate controlled -Repeat ecEF 55%, mod TR, mild to mod MR -Discontinue amlodipine given suppressed EF -Continue apixaban -Continue metoprolol tartrate  Hyperlipidemia -Continue statin and  Zetia  COPD -Patient has over 50-pack-year history of tobacco, quit 11 years ago -Stable on room air    Disposition Plan: Patient From: Home D/C Place: Home - 5/7 if stable Barriers: Not Clinically Stable--remains on IV abx due to pneumonia with leukocytosis.  Not tolerating diet  Family Communication:   Sons updated at bedside 5/6  Consultants:  none  Code Status:  FULL   DVT Prophylaxis: apixaban   Procedures: As Listed in Progress Note Above  Antibiotics: Zosyn 5/4>>> azithro 5/5>>      Subjective: Feeling better today.  N/v improved.  One BM today.  Patient denies fevers, chills, headache, chest pain, dyspnea, abdominal pain, dysuria, hematuria, hematochezia, and melena.   Objective: Vitals:   02/09/20 2002 02/09/20 2136 02/10/20 1026 02/10/20 1705  BP:  (!) 100/56 111/73 125/88  Pulse:  73 (!) 101 (!) 106  Resp:  16 16 16   Temp:  98.9 F (37.2 C)    TempSrc:  Oral    SpO2: 97% 95% 94% 95%  Weight:      Height:        Intake/Output Summary (Last 24 hours) at 02/10/2020 1821 Last data filed at 02/10/2020 0500 Gross per 24 hour  Intake -  Output 100 ml  Net -100 ml   Weight change:  Exam:   General:  Pt is alert, follows commands appropriately, not in acute distress  HEENT: No icterus, No thrush, No neck mass, [place]/AT  Cardiovascular: RRR, S1/S2, no rubs, no gallops  Respiratory: bibasilar rales. No wheeze  Abdomen: Soft/+BS, non tender, non distended, no guarding  Extremities: No edema, No lymphangitis, No petechiae, No rashes, no synovitis   Data Reviewed: I have personally  reviewed following labs and imaging studies Basic Metabolic Panel: Recent Labs  Lab 02/04/20 2216 02/08/20 2257 02/10/20 0454  NA 134* 134* 137  K 3.7 4.1 4.2  CL 99 100 100  CO2 24 22 26   GLUCOSE 114* 112* 86  BUN 26* 33* 34*  CREATININE 2.22* 1.98* 1.88*  CALCIUM 9.1 8.5* 8.5*  MG 1.3*  --   --    Liver Function Tests: Recent Labs  Lab  02/04/20 2216 02/08/20 2257 02/10/20 0454  AST 22 24 14*  ALT 13 16 12   ALKPHOS 71 71 61  BILITOT 1.5* 1.2 2.0*  PROT 7.4 7.4 6.1*  ALBUMIN 3.6 3.6 2.9*   Recent Labs  Lab 02/04/20 2216 02/08/20 2257  LIPASE 25 28   No results for input(s): AMMONIA in the last 168 hours. Coagulation Profile: Recent Labs  Lab 02/04/20 2216  INR 2.3*   CBC: Recent Labs  Lab 02/04/20 2216 02/08/20 2257 02/10/20 0454  WBC 17.8* 15.3* 15.1*  NEUTROABS 15.2*  --   --   HGB 11.5* 11.7* 10.1*  HCT 35.1* 36.4 31.4*  MCV 95.1 95.3 96.6  PLT 284 314 209   Cardiac Enzymes: No results for input(s): CKTOTAL, CKMB, CKMBINDEX, TROPONINI in the last 168 hours. BNP: Invalid input(s): POCBNP CBG: No results for input(s): GLUCAP in the last 168 hours. HbA1C: No results for input(s): HGBA1C in the last 72 hours. Urine analysis:    Component Value Date/Time   COLORURINE YELLOW 02/09/2020 1530   APPEARANCEUR HAZY (A) 02/09/2020 1530   LABSPEC 1.013 02/09/2020 1530   PHURINE 5.0 02/09/2020 1530   GLUCOSEU NEGATIVE 02/09/2020 1530   HGBUR MODERATE (A) 02/09/2020 1530   BILIRUBINUR NEGATIVE 02/09/2020 1530   BILIRUBINUR NEG 01/05/2013 1106   KETONESUR NEGATIVE 02/09/2020 1530   PROTEINUR NEGATIVE 02/09/2020 1530   UROBILINOGEN 1.0 08/23/2014 0001   NITRITE NEGATIVE 02/09/2020 1530   LEUKOCYTESUR LARGE (A) 02/09/2020 1530   Sepsis Labs: @LABRCNTIP (procalcitonin:4,lacticidven:4) ) Recent Results (from the past 240 hour(s))  Respiratory Panel by RT PCR (Flu A&B, Covid) - Nasopharyngeal Swab     Status: None   Collection Time: 02/09/20  4:50 AM   Specimen: Nasopharyngeal Swab  Result Value Ref Range Status   SARS Coronavirus 2 by RT PCR NEGATIVE NEGATIVE Final    Comment: (NOTE) SARS-CoV-2 target nucleic acids are NOT DETECTED. The SARS-CoV-2 RNA is generally detectable in upper respiratoy specimens during the acute phase of infection. The lowest concentration of SARS-CoV-2 viral copies  this assay can detect is 131 copies/mL. A negative result does not preclude SARS-Cov-2 infection and should not be used as the sole basis for treatment or other patient management decisions. A negative result may occur with  improper specimen collection/handling, submission of specimen other than nasopharyngeal swab, presence of viral mutation(s) within the areas targeted by this assay, and inadequate number of viral copies (<131 copies/mL). A negative result must be combined with clinical observations, patient history, and epidemiological information. The expected result is Negative. Fact Sheet for Patients:  PinkCheek.be Fact Sheet for Healthcare Providers:  GravelBags.it This test is not yet ap proved or cleared by the [place] FDA and  has been authorized for detection and/or diagnosis of SARS-CoV-2 by FDA under an Emergency Use Authorization (EUA). This EUA will remain  in effect (meaning this test can be used) for the duration of the COVID-19 declaration under Section 564(b)(1) of the Act, 21 U.S.C. section 360bbb-3(b)(1), unless the authorization is terminated or revoked sooner.  Influenza A by PCR NEGATIVE NEGATIVE Final   Influenza B by PCR NEGATIVE NEGATIVE Final    Comment: (NOTE) The Xpert Xpress SARS-CoV-2/FLU/RSV assay is intended as an aid in  the diagnosis of influenza from Nasopharyngeal swab specimens and  should not be used as a sole basis for treatment. Nasal washings and  aspirates are unacceptable for Xpert Xpress SARS-CoV-2/FLU/RSV  testing. Fact Sheet for Patients: PinkCheek.be Fact Sheet for Healthcare Providers: GravelBags.it This test is not yet approved or cleared by the [place] FDA and  has been authorized for detection and/or diagnosis of SARS-CoV-2 by  FDA under an Emergency Use Authorization (EUA). This EUA will remain  in  effect (meaning this test can be used) for the duration of the  Covid-19 declaration under Section 564(b)(1) of the Act, 21  U.S.C. section 360bbb-3(b)(1), unless the authorization is  terminated or revoked. Performed at [facility], [address] [place], [place] [address]   Culture, blood (Routine X 2) w Reflex to ID Panel     Status: None (Preliminary result)   Collection Time: 02/09/20  5:23 AM   Specimen: BLOOD RIGHT ARM  Result Value Ref Range Status   Specimen Description BLOOD RIGHT ARM  Final   Special Requests   Final    BOTTLES DRAWN AEROBIC AND ANAEROBIC Blood Culture adequate volume   Culture   Final    NO GROWTH 1 DAY Performed at [facility], [address], [place], [place] [address]    Report Status PENDING  Incomplete  Culture, blood (Routine X 2) w Reflex to ID Panel     Status: None (Preliminary result)   Collection Time: 02/09/20  5:33 AM   Specimen: BLOOD RIGHT ARM  Result Value Ref Range Status   Specimen Description BLOOD RIGHT ARM  Final   Special Requests   Final    BOTTLES DRAWN AEROBIC AND ANAEROBIC Blood Culture adequate volume   Culture   Final    NO GROWTH 1 DAY Performed at [facility], [address] [place], [place] [address]    Report Status PENDING  Incomplete  Culture, Urine     Status: Abnormal (Preliminary result)   Collection Time: 02/09/20  3:30 PM   Specimen: Urine, Clean Catch  Result Value Ref Range Status   Specimen Description   Final    URINE, CLEAN CATCH Performed at [facility], [address], [place], [place] [address]    Special Requests   Final    NONE Performed at [facility], [address], [place], [place] [address]    Culture 40,000 COLONIES/mL GRAM NEGATIVE RODS (A)  Final   Report Status PENDING  Incomplete     Scheduled Meds: . apixaban  2.5 mg Oral BID  . atorvastatin  40 mg Oral QPM  . azithromycin  500 mg Oral Daily  . citalopram  20 mg Oral Daily  . ezetimibe  10 mg Oral Daily  . metoprolol  tartrate  25 mg Oral BID  . sodium chloride flush  3 mL Intravenous Q12H   Continuous Infusions: . sodium chloride    . piperacillin-tazobactam (ZOSYN)  IV 3.375 g (02/10/20 1022)    Procedures/Studies: CT ABDOMEN PELVIS WO CONTRAST  Result Date: 02/09/2020 CLINICAL DATA:  Nausea and vomiting for 2 days. EXAM: CT ABDOMEN AND PELVIS WITHOUT CONTRAST TECHNIQUE: Multidetector CT imaging of the abdomen and pelvis was performed following the standard protocol without IV contrast. COMPARISON:  None. FINDINGS: Lower chest: Confluent airspace disease in both  lower lobes, question aspiration in this setting. Cardiomegaly and coronary atherosclerosis. Hepatobiliary: No focal liver abnormality.Small layering calculi in the gallbladder. No right upper quadrant inflammatory changes. Pancreas: Unremarkable. Spleen: Unremarkable. Adrenals/Urinary Tract: Negative adrenals. No hydronephrosis or ureteral stone. Bilateral renal cystic densities. There is a subcentimeter hyperdense lesion on the left consistent with hemorrhagic cyst given the degree of density for size. Large medullary region stone in the interpolar left kidney measuring 8 mm. Unremarkable bladder. Stomach/Bowel: No obstruction or visible inflammation. Duodenal diverticulum. Vascular/Lymphatic: Heavily calcified aorta and iliacs with degree of calcified plaque suggesting bilateral common iliac stenosis. No mass or adenopathy. Reproductive:Unremarkable for age Other: No ascites or pneumoperitoneum. Musculoskeletal: Osteopenia.  No acute abnormalities. IMPRESSION: 1. Negative for bowel obstruction or visible inflammation. 2. Extensive bilateral lower lobe airspace disease with history favoring aspiration. 3. Cholelithiasis and left nephrolithiasis. Electronically Signed   By: Monte Fantasia M.D.   On: 02/09/2020 04:29   DG Chest Port 1 View  Result Date: 02/09/2020 CLINICAL DATA:  86 year old female with shortness of breath. EXAM: PORTABLE CHEST 1 VIEW  COMPARISON:  Chest radiograph dated 02/04/2020 and CT dated 09/06/2015. FINDINGS: Chronic interstitial coarsening and mild bronchitic changes. Right lung base nodular density, increased since the prior radiograph most consistent with developing infiltrate. Clinical correlation and follow-up to resolution recommended. No lobar consolidation, pleural effusion, pneumothorax. Mild cardiomegaly. Median sternotomy wires and CABG vascular clips. Atherosclerotic calcification of the aorta. No acute osseous pathology. IMPRESSION: Progressed right lung base nodular density most consistent with worsening pneumonia. Clinical correlation and follow-up to resolution recommended. Electronically Signed   By: Anner Crete M.D.   On: 02/09/2020 02:39   DG Chest Portable 1 View  Result Date: 02/04/2020 CLINICAL DATA:  Nausea, diaphoresis and weakness. EXAM: PORTABLE CHEST 1 VIEW COMPARISON:  September 08, 2017 FINDINGS: Multiple sternal wires and vascular clips are seen. Mild atelectasis and/or infiltrate is seen within the right lung base. There is no evidence of a pleural effusion or pneumothorax. The heart size and mediastinal contours are within normal limits. There is mild calcification of the aortic arch. Degenerative changes seen throughout the thoracic spine. IMPRESSION: 1. Evidence of prior median sternotomy/CABG. 2. Mild right basilar atelectasis and/or infiltrate. Electronically Signed   By: Virgina Norfolk M.D.   On: 02/04/2020 22:35   ECHOCARDIOGRAM COMPLETE  Result Date: 02/09/2020    ECHOCARDIOGRAM REPORT   Patient Name:   ROMEY COHEA Date of Exam: 02/09/2020 Medical Rec #:  [id number]             Height:       63.0 in Accession #:    [id number]            Weight:       117.1 lb Date of Birth:  1934/08/24             BSA:          1.540 m Patient Age:    37 years              BP:           119/73 mmHg Patient Gender: F                     HR:           100 bpm. Exam Location:  [facility] Procedure: 2D  Echo, Cardiac Doppler and Color Doppler Indications:    Atrial Fibrillation 427.31 / I48.91  History:  Patient has prior history of Echocardiogram examinations, most                 recent 10/30/2015. CHF, CAD and Previous Myocardial Infarction,                 Arrythmias:Atrial Fibrillation; Risk Factors:Hypertension and                 Dyslipidemia. Chronic kidney disease (CKD), stage IV                 (severe),Long term current use of anticoagulant therapy.  Sonographer:    Alvino Chapel RCS Referring Phys: [phone] Kelliann Pendergraph IMPRESSIONS  1. Left ventricular ejection fraction, by estimation, is 55%. The left ventricle has normal function. The left ventricle demonstrates regional wall motion abnormalities (see scoring diagram/findings for description). Left ventricular diastolic parameters are indeterminate.  2. Right ventricular systolic function is mildly reduced. The right ventricular size is normal. There is mildly elevated pulmonary artery systolic pressure.  3. Left atrial size was severely dilated.  4. Right atrial size was moderately dilated.  5. The mitral valve is grossly normal. Mild to moderate mitral valve regurgitation.  6. Tricuspid valve regurgitation is moderate.  7. Morphologically, there appears to be at least moderate calcific aortic stenosis, if not moderate to severe stenosis. The aortic valve is tricuspid. Aortic valve regurgitation is not visualized.  8. The inferior vena cava is normal in size with greater than 50% respiratory variability, suggesting right atrial pressure of 3 mmHg. FINDINGS  Left Ventricle: Left ventricular ejection fraction, by estimation, is 55%. The left ventricle has normal function. The left ventricle demonstrates regional wall motion abnormalities. The left ventricular internal cavity size was normal in size. There is  no left ventricular hypertrophy. Left ventricular diastolic parameters are indeterminate.  LV Wall Scoring: The basal inferolateral segment is  hypokinetic. Right Ventricle: The right ventricular size is normal. No increase in right ventricular wall thickness. Right ventricular systolic function is mildly reduced. There is mildly elevated pulmonary artery systolic pressure. The tricuspid regurgitant velocity  is 2.89 m/s, and with an assumed right atrial pressure of 3 mmHg, the estimated right ventricular systolic pressure is 96.0 mmHg. Left Atrium: Left atrial size was severely dilated. Right Atrium: Right atrial size was moderately dilated. Pericardium: There is no evidence of pericardial effusion. Mitral Valve: The mitral valve is grossly normal. Mild to moderate mitral valve regurgitation. Tricuspid Valve: The tricuspid valve is grossly normal. Tricuspid valve regurgitation is moderate. Aortic Valve: Morphologically, there appears to be at least moderate calcific aortic stenosis, if not moderate to severe stenosis. The aortic valve is tricuspid. Aortic valve regurgitation is not visualized. Mild aortic valve annular calcification. There  is moderate calcification of the aortic valve. Pulmonic Valve: The pulmonic valve was grossly normal. Pulmonic valve regurgitation is mild. Aorta: The aortic root is normal in size and structure. Venous: The inferior vena cava is normal in size with greater than 50% respiratory variability, suggesting right atrial pressure of 3 mmHg. IAS/Shunts: No atrial level shunt detected by color flow Doppler.  LEFT VENTRICLE PLAX 2D LVIDd:         4.48 cm LVIDs:         3.50 cm LV PW:         0.92 cm LV IVS:        0.96 cm LVOT diam:     1.70 cm LV SV:         32 LV SV Index:  21 LVOT Area:     2.27 cm  RIGHT VENTRICLE RV S prime:     9.79 cm/s TAPSE (M-mode): 1.5 cm LEFT ATRIUM             Index       RIGHT ATRIUM           Index LA diam:        4.50 cm 2.92 cm/m  RA Area:     21.10 cm LA Vol (A2C):   76.2 ml 49.45 ml/m RA Volume:   58.60 ml  38.05 ml/m LA Vol (A4C):   62.3 ml 40.45 ml/m LA Biplane Vol: 68.8 ml 44.68 ml/m   AORTIC VALVE LVOT Vmax:   80.90 cm/s LVOT Vmean:  52.700 cm/s LVOT VTI:    0.142 m  AORTA Ao Root diam: 3.00 cm MITRAL VALVE                TRICUSPID VALVE MV Area (PHT): 4.33 cm     TR Peak grad:   33.4 mmHg MV Decel Time: 175 msec     TR Vmax:        289.00 cm/s MV E velocity: 101.00 cm/s                             SHUNTS                             Systemic VTI:  0.14 m                             Systemic Diam: 1.70 cm Kate Sable MD Electronically signed by Kate Sable MD Signature Date/Time: 02/09/2020/2:43:43 PM    Final     Orson Eva, DO  [facility]  If 7PM-7AM, please contact night-coverage [email or web address] Password [email or web address] 02/10/2020, 6:21 PM   LOS: 1 day

## 2020-02-11 ENCOUNTER — Telehealth: Payer: Self-pay | Admitting: *Deleted

## 2020-02-11 LAB — CBC
HCT: 29.6 % — ABNORMAL LOW (ref 36.0–46.0)
Hemoglobin: 9.6 g/dL — ABNORMAL LOW (ref 12.0–15.0)
MCH: 31.2 pg (ref 26.0–34.0)
MCHC: 32.4 g/dL (ref 30.0–36.0)
MCV: 96.1 fL (ref 80.0–100.0)
Platelets: 221 10*3/uL (ref 150–400)
RBC: 3.08 MIL/uL — ABNORMAL LOW (ref 3.87–5.11)
RDW: 13.4 % (ref 11.5–15.5)
WBC: 13.1 10*3/uL — ABNORMAL HIGH (ref 4.0–10.5)
nRBC: 0 % (ref 0.0–0.2)

## 2020-02-11 LAB — BASIC METABOLIC PANEL
Anion gap: 14 (ref 5–15)
BUN: 31 mg/dL — ABNORMAL HIGH (ref 8–23)
CO2: 25 mmol/L (ref 22–32)
Calcium: 8.5 mg/dL — ABNORMAL LOW (ref 8.9–10.3)
Chloride: 98 mmol/L (ref 98–111)
Creatinine, Ser: 1.68 mg/dL — ABNORMAL HIGH (ref 0.44–1.00)
GFR calc Af Amer: 32 mL/min — ABNORMAL LOW (ref >60)
GFR calc non Af Amer: 28 mL/min — ABNORMAL LOW (ref >60)
Glucose, Bld: 67 mg/dL — ABNORMAL LOW (ref 70–99)
Potassium: 3.5 mmol/L (ref 3.5–5.1)
Sodium: 137 mmol/L (ref 135–145)

## 2020-02-11 LAB — URINE CULTURE: Culture: 70000 — AB

## 2020-02-11 LAB — MAGNESIUM: Magnesium: 1.7 mg/dL (ref 1.7–2.4)

## 2020-02-11 MED ORDER — CEFDINIR 300 MG PO CAPS: 300.0000 mg | ORAL_CAPSULE | Freq: Two times a day (BID) | ORAL | Status: DC

## 2020-02-11 MED ORDER — APIXABAN 2.5 MG PO TABS: 2.5000 mg | ORAL_TABLET | Freq: Two times a day (BID) | ORAL | 1 refills | Status: DC

## 2020-02-11 MED ORDER — SODIUM CHLORIDE 0.9 % IV SOLN
INTRAVENOUS | Status: DC | PRN
  Administered 2020-02-11: 09:00:00 1000 mL via INTRAVENOUS

## 2020-02-11 MED ORDER — AZITHROMYCIN 500 MG PO TABS: 500.0000 mg | ORAL_TABLET | Freq: Every day | ORAL | 0 refills | Status: DC

## 2020-02-11 MED ORDER — CEFDINIR 300 MG PO CAPS: 300.0000 mg | ORAL_CAPSULE | Freq: Two times a day (BID) | ORAL | 0 refills | Status: DC

## 2020-02-11 NOTE — Discharge Summary (Signed)
Physician Discharge Summary  Laura Jacobs PFX:[id number] DOB: April 06, 1934 DOA: 02/08/2020  PCP: [name], FNP  Admit date: 02/08/2020 Discharge date: 02/11/2020  Admitted From: Home Disposition:  Home  Recommendations for Outpatient Follow-up:  1. Follow up with PCP in 1-2 weeks 2. Please obtain BMP/CBC in one week   Home Health:yes Equipment/Devices: HHPT  Discharge Condition: Stable CODE STATUS: FULL Diet recommendation: Heart Healthy   Brief/Interim Summary: 86 year old female with a history of atrial fibrillation on apixaban, coronary disease, hyperlipidemia, CKD stage IV, hypertension presenting with nausea and dry heaving for the past 2 to 3 days. Unfortunately, the patient is a poor historian. The patient's son with whom she lives is also a poor historian. Nevertheless, she has been complaining of some shortness of breath and a nonproductive cough. She denied any diarrhea, hematochezia, chest pain, abdominal pain, dysuria, hematuria. She denies any new medications. She denies any Covid contacts. In the emergency department, the patient had low-grade temperature of 99.5 F. She has soft blood pressures. The patient was noted to have atrial fibrillation with RVR. She was given IV Lopressor with improvement. Chest x-ray showed right basilar nodular density. CT of the abdomen and pelvis showed confluent airspace disease in the bilateral lower lobes with cholelithiasis. The patient was started on Zosyn initiially.  Discharge Diagnoses:  Sepsis -Present on admission -Presented with lactic acid 2.1 and WBC 15.3 -Secondary to pneumonia and UTI -Judicious IV fluids for 24 hours -Continue Zosyn initially  Intractable nausea and vomiting -startclear liquid diet>>>advance to soft diet which pt tolerated -Discontinue Zofran  Aspiration pneumonia -Continue Zosyn -stable on RA -d/c home with cefdinir and azithro x 4 more days  UTI -UA >50  wbc -urine culture = GNR  Chronic systolic CHF -01/02/9241 echo--EF 45%, no WMA, moderate TR -Appears clinically euvolemic -Judicious IV fluids -02/09/20 Echo--Repeat ecEF 55%, mod TR, mild to mod MR -restart lasix after d/c  Atrial fibrillation, type unspecified with RVR -Currently rate controlled -Repeat ecEF 55%, mod TR, mild to mod MR -Discontinue amlodipine given suppressed EF -Continue apixaban -Continue metoprolol tartrate  Hyperlipidemia -Continue statin and Zetia  COPD -Patient has over 50-pack-year history of tobacco, quit 11 years ago -Stable on room air  Hypertension -d/c amlodipine due to prior low EF and soft BPs -continued metoprolol   Discharge Instructions   Allergies as of 02/11/2020   No Known Allergies     Medication List    STOP taking these medications   amLODipine 5 MG tablet Commonly known as: NORVASC     TAKE these medications   apixaban 2.5 MG Tabs tablet Commonly known as: ELIQUIS Take 1 tablet (2.5 mg total) by mouth 2 (two) times daily. What changed:   medication strength  how much to take   atorvastatin 40 MG tablet Commonly known as: LIPITOR TAKE 1 TABLET ONCE DAILY IN THE EVENING What changed:   how much to take  how to take this  when to take this  additional instructions   azithromycin 500 MG tablet Commonly known as: ZITHROMAX Take 1 tablet (500 mg total) by mouth daily. Start taking on: Feb 12, 2020   calcium-vitamin D 500-200 MG-UNIT tablet Commonly known as: Oscal 500/200 D-3 Take 1 tablet by mouth 2 (two) times daily.   cefdinir 300 MG capsule Commonly known as: OMNICEF Take 1 capsule (300 mg total) by mouth every 12 (twelve) hours.   citalopram 20 MG tablet Commonly known as: CeleXA Take 1 tablet (20 mg total) by mouth daily.  ezetimibe 10 MG tablet Commonly known as: ZETIA Take 1 tablet (10 mg total) by mouth daily.   furosemide 40 MG tablet Commonly known as: LASIX Take 1 tablet (40 mg  total) by mouth daily.   metoprolol tartrate 25 MG tablet Commonly known as: LOPRESSOR TAKE  (1)  TABLET TWICE A DAY. What changed:   how much to take  how to take this  when to take this  additional instructions   Nitrostat 0.4 MG SL tablet Generic drug: nitroGLYCERIN PLACE 1 TABLET UNDER THE TONGUE EVERY 5 MINUTES AS NEEDED FOR CHEST PA IN   potassium chloride SA 20 MEQ tablet Commonly known as: Klor-Con M20 Take 1 tablet (20 mEq total) by mouth 2 (two) times daily.       No Known Allergies  Consultations:  none   Procedures/Studies: CT ABDOMEN PELVIS WO CONTRAST  Result Date: 02/09/2020 CLINICAL DATA:  Nausea and vomiting for 2 days. EXAM: CT ABDOMEN AND PELVIS WITHOUT CONTRAST TECHNIQUE: Multidetector CT imaging of the abdomen and pelvis was performed following the standard protocol without IV contrast. COMPARISON:  None. FINDINGS: Lower chest: Confluent airspace disease in both lower lobes, question aspiration in this setting. Cardiomegaly and coronary atherosclerosis. Hepatobiliary: No focal liver abnormality.Small layering calculi in the gallbladder. No right upper quadrant inflammatory changes. Pancreas: Unremarkable. Spleen: Unremarkable. Adrenals/Urinary Tract: Negative adrenals. No hydronephrosis or ureteral stone. Bilateral renal cystic densities. There is a subcentimeter hyperdense lesion on the left consistent with hemorrhagic cyst given the degree of density for size. Large medullary region stone in the interpolar left kidney measuring 8 mm. Unremarkable bladder. Stomach/Bowel: No obstruction or visible inflammation. Duodenal diverticulum. Vascular/Lymphatic: Heavily calcified aorta and iliacs with degree of calcified plaque suggesting bilateral common iliac stenosis. No mass or adenopathy. Reproductive:Unremarkable for age Other: No ascites or pneumoperitoneum. Musculoskeletal: Osteopenia.  No acute abnormalities. IMPRESSION: 1. Negative for bowel obstruction or  visible inflammation. 2. Extensive bilateral lower lobe airspace disease with history favoring aspiration. 3. Cholelithiasis and left nephrolithiasis. Electronically Signed   By: Monte Fantasia M.D.   On: 02/09/2020 04:29   DG Chest Port 1 View  Result Date: 02/09/2020 CLINICAL DATA:  86 year old female with shortness of breath. EXAM: PORTABLE CHEST 1 VIEW COMPARISON:  Chest radiograph dated 02/04/2020 and CT dated 09/06/2015. FINDINGS: Chronic interstitial coarsening and mild bronchitic changes. Right lung base nodular density, increased since the prior radiograph most consistent with developing infiltrate. Clinical correlation and follow-up to resolution recommended. No lobar consolidation, pleural effusion, pneumothorax. Mild cardiomegaly. Median sternotomy wires and CABG vascular clips. Atherosclerotic calcification of the aorta. No acute osseous pathology. IMPRESSION: Progressed right lung base nodular density most consistent with worsening pneumonia. Clinical correlation and follow-up to resolution recommended. Electronically Signed   By: Anner Crete M.D.   On: 02/09/2020 02:39   DG Chest Portable 1 View  Result Date: 02/04/2020 CLINICAL DATA:  Nausea, diaphoresis and weakness. EXAM: PORTABLE CHEST 1 VIEW COMPARISON:  September 08, 2017 FINDINGS: Multiple sternal wires and vascular clips are seen. Mild atelectasis and/or infiltrate is seen within the right lung base. There is no evidence of a pleural effusion or pneumothorax. The heart size and mediastinal contours are within normal limits. There is mild calcification of the aortic arch. Degenerative changes seen throughout the thoracic spine. IMPRESSION: 1. Evidence of prior median sternotomy/CABG. 2. Mild right basilar atelectasis and/or infiltrate. Electronically Signed   By: Virgina Norfolk M.D.   On: 02/04/2020 22:35   ECHOCARDIOGRAM COMPLETE  Result Date: 02/09/2020  ECHOCARDIOGRAM REPORT   Patient Name:   Laura Jacobs Date of  Exam: 02/09/2020 Medical Rec #:  [id number]             Height:       63.0 in Accession #:    [id number]            Weight:       117.1 lb Date of Birth:  Apr 15, 1934             BSA:          1.540 m Patient Age:    42 years              BP:           119/73 mmHg Patient Gender: F                     HR:           100 bpm. Exam Location:  [facility] Procedure: 2D Echo, Cardiac Doppler and Color Doppler Indications:    Atrial Fibrillation 427.31 / I48.91  History:        Patient has prior history of Echocardiogram examinations, most                 recent 10/30/2015. CHF, CAD and Previous Myocardial Infarction,                 Arrythmias:Atrial Fibrillation; Risk Factors:Hypertension and                 Dyslipidemia. Chronic kidney disease (CKD), stage IV                 (severe),Long term current use of anticoagulant therapy.  Sonographer:    Alvino Chapel RCS Referring Phys: [phone] Celestina Gironda IMPRESSIONS  1. Left ventricular ejection fraction, by estimation, is 55%. The left ventricle has normal function. The left ventricle demonstrates regional wall motion abnormalities (see scoring diagram/findings for description). Left ventricular diastolic parameters are indeterminate.  2. Right ventricular systolic function is mildly reduced. The right ventricular size is normal. There is mildly elevated pulmonary artery systolic pressure.  3. Left atrial size was severely dilated.  4. Right atrial size was moderately dilated.  5. The mitral valve is grossly normal. Mild to moderate mitral valve regurgitation.  6. Tricuspid valve regurgitation is moderate.  7. Morphologically, there appears to be at least moderate calcific aortic stenosis, if not moderate to severe stenosis. The aortic valve is tricuspid. Aortic valve regurgitation is not visualized.  8. The inferior vena cava is normal in size with greater than 50% respiratory variability, suggesting right atrial pressure of 3 mmHg. FINDINGS  Left Ventricle: Left ventricular  ejection fraction, by estimation, is 55%. The left ventricle has normal function. The left ventricle demonstrates regional wall motion abnormalities. The left ventricular internal cavity size was normal in size. There is  no left ventricular hypertrophy. Left ventricular diastolic parameters are indeterminate.  LV Wall Scoring: The basal inferolateral segment is hypokinetic. Right Ventricle: The right ventricular size is normal. No increase in right ventricular wall thickness. Right ventricular systolic function is mildly reduced. There is mildly elevated pulmonary artery systolic pressure. The tricuspid regurgitant velocity  is 2.89 m/s, and with an assumed right atrial pressure of 3 mmHg, the estimated right ventricular systolic pressure is 35.0 mmHg. Left Atrium: Left atrial size was severely dilated. Right Atrium: Right atrial size was moderately dilated. Pericardium: There is no evidence of pericardial effusion. Mitral  Valve: The mitral valve is grossly normal. Mild to moderate mitral valve regurgitation. Tricuspid Valve: The tricuspid valve is grossly normal. Tricuspid valve regurgitation is moderate. Aortic Valve: Morphologically, there appears to be at least moderate calcific aortic stenosis, if not moderate to severe stenosis. The aortic valve is tricuspid. Aortic valve regurgitation is not visualized. Mild aortic valve annular calcification. There  is moderate calcification of the aortic valve. Pulmonic Valve: The pulmonic valve was grossly normal. Pulmonic valve regurgitation is mild. Aorta: The aortic root is normal in size and structure. Venous: The inferior vena cava is normal in size with greater than 50% respiratory variability, suggesting right atrial pressure of 3 mmHg. IAS/Shunts: No atrial level shunt detected by color flow Doppler.  LEFT VENTRICLE PLAX 2D LVIDd:         4.48 cm LVIDs:         3.50 cm LV PW:         0.92 cm LV IVS:        0.96 cm LVOT diam:     1.70 cm LV SV:         32 LV SV  Index:   21 LVOT Area:     2.27 cm  RIGHT VENTRICLE RV S prime:     9.79 cm/s TAPSE (M-mode): 1.5 cm LEFT ATRIUM             Index       RIGHT ATRIUM           Index LA diam:        4.50 cm 2.92 cm/m  RA Area:     21.10 cm LA Vol (A2C):   76.2 ml 49.45 ml/m RA Volume:   58.60 ml  38.05 ml/m LA Vol (A4C):   62.3 ml 40.45 ml/m LA Biplane Vol: 68.8 ml 44.68 ml/m  AORTIC VALVE LVOT Vmax:   80.90 cm/s LVOT Vmean:  52.700 cm/s LVOT VTI:    0.142 m  AORTA Ao Root diam: 3.00 cm MITRAL VALVE                TRICUSPID VALVE MV Area (PHT): 4.33 cm     TR Peak grad:   33.4 mmHg MV Decel Time: 175 msec     TR Vmax:        289.00 cm/s MV E velocity: 101.00 cm/s                             SHUNTS                             Systemic VTI:  0.14 m                             Systemic Diam: 1.70 cm Kate Sable MD Electronically signed by Kate Sable MD Signature Date/Time: 02/09/2020/2:43:43 PM    Final          Discharge Exam: Vitals:   02/11/20 0505 02/11/20 0507  BP: (!) 89/54 107/65  Pulse: 70 80  Resp: 16   Temp: 97.8 F (36.6 C)   SpO2: 95%    Vitals:   02/10/20 2011 02/10/20 2220 02/11/20 0505 02/11/20 0507  BP:  127/72 (!) 89/54 107/65  Pulse:  89 70 80  Resp:   16   Temp:   97.8 F (36.6 C)   TempSrc:  Oral   SpO2: 90%  95%   Weight:      Height:        General: Pt is alert, awake, not in acute distress Cardiovascular: RRR, S1/S2 +, no rubs, no gallops Respiratory: bibasilar crackles. No wheeze Abdominal: Soft, NT, ND, bowel sounds + Extremities: no edema, no cyanosis   The results of significant diagnostics from this hospitalization (including imaging, microbiology, ancillary and laboratory) are listed below for reference.    Significant Diagnostic Studies: CT ABDOMEN PELVIS WO CONTRAST  Result Date: 02/09/2020 CLINICAL DATA:  Nausea and vomiting for 2 days. EXAM: CT ABDOMEN AND PELVIS WITHOUT CONTRAST TECHNIQUE: Multidetector CT imaging of the abdomen and pelvis  was performed following the standard protocol without IV contrast. COMPARISON:  None. FINDINGS: Lower chest: Confluent airspace disease in both lower lobes, question aspiration in this setting. Cardiomegaly and coronary atherosclerosis. Hepatobiliary: No focal liver abnormality.Small layering calculi in the gallbladder. No right upper quadrant inflammatory changes. Pancreas: Unremarkable. Spleen: Unremarkable. Adrenals/Urinary Tract: Negative adrenals. No hydronephrosis or ureteral stone. Bilateral renal cystic densities. There is a subcentimeter hyperdense lesion on the left consistent with hemorrhagic cyst given the degree of density for size. Large medullary region stone in the interpolar left kidney measuring 8 mm. Unremarkable bladder. Stomach/Bowel: No obstruction or visible inflammation. Duodenal diverticulum. Vascular/Lymphatic: Heavily calcified aorta and iliacs with degree of calcified plaque suggesting bilateral common iliac stenosis. No mass or adenopathy. Reproductive:Unremarkable for age Other: No ascites or pneumoperitoneum. Musculoskeletal: Osteopenia.  No acute abnormalities. IMPRESSION: 1. Negative for bowel obstruction or visible inflammation. 2. Extensive bilateral lower lobe airspace disease with history favoring aspiration. 3. Cholelithiasis and left nephrolithiasis. Electronically Signed   By: Monte Fantasia M.D.   On: 02/09/2020 04:29   DG Chest Port 1 View  Result Date: 02/09/2020 CLINICAL DATA:  86 year old female with shortness of breath. EXAM: PORTABLE CHEST 1 VIEW COMPARISON:  Chest radiograph dated 02/04/2020 and CT dated 09/06/2015. FINDINGS: Chronic interstitial coarsening and mild bronchitic changes. Right lung base nodular density, increased since the prior radiograph most consistent with developing infiltrate. Clinical correlation and follow-up to resolution recommended. No lobar consolidation, pleural effusion, pneumothorax. Mild cardiomegaly. Median sternotomy wires and CABG  vascular clips. Atherosclerotic calcification of the aorta. No acute osseous pathology. IMPRESSION: Progressed right lung base nodular density most consistent with worsening pneumonia. Clinical correlation and follow-up to resolution recommended. Electronically Signed   By: Anner Crete M.D.   On: 02/09/2020 02:39   DG Chest Portable 1 View  Result Date: 02/04/2020 CLINICAL DATA:  Nausea, diaphoresis and weakness. EXAM: PORTABLE CHEST 1 VIEW COMPARISON:  September 08, 2017 FINDINGS: Multiple sternal wires and vascular clips are seen. Mild atelectasis and/or infiltrate is seen within the right lung base. There is no evidence of a pleural effusion or pneumothorax. The heart size and mediastinal contours are within normal limits. There is mild calcification of the aortic arch. Degenerative changes seen throughout the thoracic spine. IMPRESSION: 1. Evidence of prior median sternotomy/CABG. 2. Mild right basilar atelectasis and/or infiltrate. Electronically Signed   By: Virgina Norfolk M.D.   On: 02/04/2020 22:35   ECHOCARDIOGRAM COMPLETE  Result Date: 02/09/2020    ECHOCARDIOGRAM REPORT   Patient Name:   Laura Jacobs Date of Exam: 02/09/2020 Medical Rec #:  [id number]             Height:       63.0 in Accession #:    [id number]  Weight:       117.1 lb Date of Birth:  [birth year]             BSA:          1.540 m Patient Age:    27 years              BP:           119/73 mmHg Patient Gender: F                     HR:           100 bpm. Exam Location:  [facility] Procedure: 2D Echo, Cardiac Doppler and Color Doppler Indications:    Atrial Fibrillation 427.31 / I48.91  History:        Patient has prior history of Echocardiogram examinations, most                 recent 10/30/2015. CHF, CAD and Previous Myocardial Infarction,                 Arrythmias:Atrial Fibrillation; Risk Factors:Hypertension and                 Dyslipidemia. Chronic kidney disease (CKD), stage IV                 (severe),Long  term current use of anticoagulant therapy.  Sonographer:    Alvino Chapel RCS Referring Phys: [phone] Bani Gianfrancesco IMPRESSIONS  1. Left ventricular ejection fraction, by estimation, is 55%. The left ventricle has normal function. The left ventricle demonstrates regional wall motion abnormalities (see scoring diagram/findings for description). Left ventricular diastolic parameters are indeterminate.  2. Right ventricular systolic function is mildly reduced. The right ventricular size is normal. There is mildly elevated pulmonary artery systolic pressure.  3. Left atrial size was severely dilated.  4. Right atrial size was moderately dilated.  5. The mitral valve is grossly normal. Mild to moderate mitral valve regurgitation.  6. Tricuspid valve regurgitation is moderate.  7. Morphologically, there appears to be at least moderate calcific aortic stenosis, if not moderate to severe stenosis. The aortic valve is tricuspid. Aortic valve regurgitation is not visualized.  8. The inferior vena cava is normal in size with greater than 50% respiratory variability, suggesting right atrial pressure of 3 mmHg. FINDINGS  Left Ventricle: Left ventricular ejection fraction, by estimation, is 55%. The left ventricle has normal function. The left ventricle demonstrates regional wall motion abnormalities. The left ventricular internal cavity size was normal in size. There is  no left ventricular hypertrophy. Left ventricular diastolic parameters are indeterminate.  LV Wall Scoring: The basal inferolateral segment is hypokinetic. Right Ventricle: The right ventricular size is normal. No increase in right ventricular wall thickness. Right ventricular systolic function is mildly reduced. There is mildly elevated pulmonary artery systolic pressure. The tricuspid regurgitant velocity  is 2.89 m/s, and with an assumed right atrial pressure of 3 mmHg, the estimated right ventricular systolic pressure is 66.0 mmHg. Left Atrium: Left atrial size was  severely dilated. Right Atrium: Right atrial size was moderately dilated. Pericardium: There is no evidence of pericardial effusion. Mitral Valve: The mitral valve is grossly normal. Mild to moderate mitral valve regurgitation. Tricuspid Valve: The tricuspid valve is grossly normal. Tricuspid valve regurgitation is moderate. Aortic Valve: Morphologically, there appears to be at least moderate calcific aortic stenosis, if not moderate to severe stenosis. The aortic valve is tricuspid. Aortic valve regurgitation is not visualized. Mild aortic  valve annular calcification. There  is moderate calcification of the aortic valve. Pulmonic Valve: The pulmonic valve was grossly normal. Pulmonic valve regurgitation is mild. Aorta: The aortic root is normal in size and structure. Venous: The inferior vena cava is normal in size with greater than 50% respiratory variability, suggesting right atrial pressure of 3 mmHg. IAS/Shunts: No atrial level shunt detected by color flow Doppler.  LEFT VENTRICLE PLAX 2D LVIDd:         4.48 cm LVIDs:         3.50 cm LV PW:         0.92 cm LV IVS:        0.96 cm LVOT diam:     1.70 cm LV SV:         32 LV SV Index:   21 LVOT Area:     2.27 cm  RIGHT VENTRICLE RV S prime:     9.79 cm/s TAPSE (M-mode): 1.5 cm LEFT ATRIUM             Index       RIGHT ATRIUM           Index LA diam:        4.50 cm 2.92 cm/m  RA Area:     21.10 cm LA Vol (A2C):   76.2 ml 49.45 ml/m RA Volume:   58.60 ml  38.05 ml/m LA Vol (A4C):   62.3 ml 40.45 ml/m LA Biplane Vol: 68.8 ml 44.68 ml/m  AORTIC VALVE LVOT Vmax:   80.90 cm/s LVOT Vmean:  52.700 cm/s LVOT VTI:    0.142 m  AORTA Ao Root diam: 3.00 cm MITRAL VALVE                TRICUSPID VALVE MV Area (PHT): 4.33 cm     TR Peak grad:   33.4 mmHg MV Decel Time: 175 msec     TR Vmax:        289.00 cm/s MV E velocity: 101.00 cm/s                             SHUNTS                             Systemic VTI:  0.14 m                             Systemic Diam: 1.70 cm  Kate Sable MD Electronically signed by Kate Sable MD Signature Date/Time: 02/09/2020/2:43:43 PM    Final      Microbiology: Recent Results (from the past 240 hour(s))  Respiratory Panel by RT PCR (Flu A&B, Covid) - Nasopharyngeal Swab     Status: None   Collection Time: 02/09/20  4:50 AM   Specimen: Nasopharyngeal Swab  Result Value Ref Range Status   SARS Coronavirus 2 by RT PCR NEGATIVE NEGATIVE Final    Comment: (NOTE) SARS-CoV-2 target nucleic acids are NOT DETECTED. The SARS-CoV-2 RNA is generally detectable in upper respiratoy specimens during the acute phase of infection. The lowest concentration of SARS-CoV-2 viral copies this assay can detect is 131 copies/mL. A negative result does not preclude SARS-Cov-2 infection and should not be used as the sole basis for treatment or other patient management decisions. A negative result may occur with  improper specimen collection/handling, submission of specimen other than nasopharyngeal swab,  presence of viral mutation(s) within the areas targeted by this assay, and inadequate number of viral copies (<131 copies/mL). A negative result must be combined with clinical observations, patient history, and epidemiological information. The expected result is Negative. Fact Sheet for Patients:  PinkCheek.be Fact Sheet for Healthcare Providers:  GravelBags.it This test is not yet ap proved or cleared by the [place] FDA and  has been authorized for detection and/or diagnosis of SARS-CoV-2 by FDA under an Emergency Use Authorization (EUA). This EUA will remain  in effect (meaning this test can be used) for the duration of the COVID-19 declaration under Section 564(b)(1) of the Act, 21 U.S.C. section 360bbb-3(b)(1), unless the authorization is terminated or revoked sooner.    Influenza A by PCR NEGATIVE NEGATIVE Final   Influenza B by PCR NEGATIVE NEGATIVE Final     Comment: (NOTE) The Xpert Xpress SARS-CoV-2/FLU/RSV assay is intended as an aid in  the diagnosis of influenza from Nasopharyngeal swab specimens and  should not be used as a sole basis for treatment. Nasal washings and  aspirates are unacceptable for Xpert Xpress SARS-CoV-2/FLU/RSV  testing. Fact Sheet for Patients: PinkCheek.be Fact Sheet for Healthcare Providers: GravelBags.it This test is not yet approved or cleared by the [place] FDA and  has been authorized for detection and/or diagnosis of SARS-CoV-2 by  FDA under an Emergency Use Authorization (EUA). This EUA will remain  in effect (meaning this test can be used) for the duration of the  Covid-19 declaration under Section 564(b)(1) of the Act, 21  U.S.C. section 360bbb-3(b)(1), unless the authorization is  terminated or revoked. Performed at [facility], [address], [place], [place] [address]   Culture, blood (Routine X 2) w Reflex to ID Panel     Status: None (Preliminary result)   Collection Time: 02/09/20  5:23 AM   Specimen: BLOOD RIGHT ARM  Result Value Ref Range Status   Specimen Description BLOOD RIGHT ARM  Final   Special Requests   Final    BOTTLES DRAWN AEROBIC AND ANAEROBIC Blood Culture adequate volume   Culture   Final    NO GROWTH 2 DAYS Performed at [facility], [address], [place], [place] [address]    Report Status PENDING  Incomplete  Culture, blood (Routine X 2) w Reflex to ID Panel     Status: None (Preliminary result)   Collection Time: 02/09/20  5:33 AM   Specimen: BLOOD RIGHT ARM  Result Value Ref Range Status   Specimen Description BLOOD RIGHT ARM  Final   Special Requests   Final    BOTTLES DRAWN AEROBIC AND ANAEROBIC Blood Culture adequate volume   Culture   Final    NO GROWTH 2 DAYS Performed at [facility], [address], [place], [place] [address]    Report Status PENDING  Incomplete  Culture, Urine     Status: None  (Preliminary result)   Collection Time: 02/09/20  3:30 PM   Specimen: Urine, Clean Catch  Result Value Ref Range Status   Specimen Description   Final    URINE, CLEAN CATCH Performed at [facility], [address], [place], [place] [address]    Special Requests   Final    NONE Performed at [facility], [address] [place], [place] [address]    Culture   Final    CULTURE REINCUBATED FOR BETTER GROWTH Performed at [facility], [place] [address] [place], [place] [address]    Report Status PENDING  Incomplete     Labs: Basic Metabolic Panel: Recent Labs  Lab 02/04/20 2216 02/04/20 2216 02/08/20 2257 02/08/20 2257 02/10/20 0454 02/11/20 0459  NA 134*  --  134*  --  137 137  K 3.7   < > 4.1   < > 4.2 3.5  CL 99  --  100  --  100 98  CO2 24  --  22  --  26 25  GLUCOSE 114*  --  112*  --  86 67*  BUN 26*  --  33*  --  34* 31*  CREATININE 2.22*  --  1.98*  --  1.88* 1.68*  CALCIUM 9.1  --  8.5*  --  8.5* 8.5*  MG 1.3*  --   --   --   --  1.7   < > = values in this interval not displayed.   Liver Function Tests: Recent Labs  Lab 02/04/20 2216 02/08/20 2257 02/10/20 0454  AST 22 24 14*  ALT 13 16 12   ALKPHOS 71 71 61  BILITOT 1.5* 1.2 2.0*  PROT 7.4 7.4 6.1*  ALBUMIN 3.6 3.6 2.9*   Recent Labs  Lab 02/04/20 2216 02/08/20 2257  LIPASE 25 28   No results for input(s): AMMONIA in the last 168 hours. CBC: Recent Labs  Lab 02/04/20 2216 02/08/20 2257 02/10/20 0454 02/11/20 0459  WBC 17.8* 15.3* 15.1* 13.1*  NEUTROABS 15.2*  --   --   --   HGB 11.5* 11.7* 10.1* 9.6*  HCT 35.1* 36.4 31.4* 29.6*  MCV 95.1 95.3 96.6 96.1  PLT 284 314 209 221   Cardiac Enzymes: No results for input(s): CKTOTAL, CKMB, CKMBINDEX, TROPONINI in the last 168 hours. BNP: Invalid input(s): POCBNP CBG: No results for input(s): GLUCAP in the last 168 hours.  Time coordinating discharge:  36 minutes  Signed:  Orson Eva, DO [facility] Pager: [phone] 02/11/2020,  12:49 PM

## 2020-02-11 NOTE — Care Management (Signed)
Home Health PT recommendation discussed with son. He declines home health PT, stating he feels he can help his mom at home. He is with her 24/7. Reports his mom is very active around the house. They have a rolling walker at home if needed.

## 2020-02-11 NOTE — Evaluation (Signed)
Physical Therapy Evaluation Patient Details Name: Laura Jacobs MRN: [id number] DOB: 1934-03-02 Today's Date: 02/11/2020   History of Present Illness  Laura Jacobs  is a 86 y.o. female, CAD, hyperlipidemia, hypertension, atrial fibrillation on anticoagulation with Eliquis initially came to hospital with complaints of nausea vomiting and diarrhea.  Patient was found to be tachycardic on arrival she was given IV Lopressor with improvement in heart rate.  She was also noted to be tachypneic.  Chest x-ray showed right lung base nodular density concerning for pneumonia.  CT of the abdomen pelvis was done which showed bilateral lower lobe airspace disease consistent with aspiration.    Clinical Impression  Patient limited for functional mobility as stated below secondary to BLE weakness, fatigue and poor standing balance. Patient able to complete bed mobility without assist. She demonstrates min/mod unsteadiness upon standing which improves with RW. She ambulates with RW and min guard for safety and balance. She requires moderate verbal cueing for use of RW and keeping RW on ground while ambulating. Patient demonstrates ability to transfer to and from bed and toilet today. She is limited by fatigue today but is motivated to improve function. Patient will benefit from continued physical therapy in hospital and recommended venue below to increase strength, balance, endurance for safe ADLs and gait.     Follow Up Recommendations Home health PT;Supervision for mobility/OOB;Supervision - Intermittent    Equipment Recommendations  Rolling walker with 5" wheels    Recommendations for Other Services       Precautions / Restrictions Precautions Precautions: Fall Restrictions Weight Bearing Restrictions: No      Mobility  Bed Mobility Overal bed mobility: Modified Independent             General bed mobility comments: slow, labored transition to seated EOB with HOB slightly  elevated  Transfers Overall transfer level: Needs assistance Equipment used: Rolling walker (2 wheeled) Transfers: Sit to/from [facility] Sit to Stand: Min guard Stand pivot transfers: Min guard       General transfer comment: guard for safety and balance  Ambulation/Gait Ambulation/Gait assistance: Min guard Gait Distance (Feet): 20 Feet Assistive device: Rolling walker (2 wheeled) Gait Pattern/deviations: Decreased step length - right;Decreased step length - left;Decreased stride length Gait velocity: decreased   General Gait Details: slow, labored gait, verbal cueing for RW use as patient tends to pick RW up  Stairs            Wheelchair Mobility    Modified Rankin (Stroke Patients Only)       Balance Overall balance assessment: Needs assistance Sitting-balance support: No upper extremity supported;Feet supported Sitting balance-Leahy Scale: Good Sitting balance - Comments: seated EOB     Standing balance-Leahy Scale: Fair Standing balance comment: Fair using RW                             Pertinent Vitals/Pain Pain Assessment: No/denies pain    Home Living Family/patient expects to be discharged to:: Private residence Living Arrangements: Spouse/significant other;Children Available Help at Discharge: Family;Available 24 hours/day Type of Home: House Home Access: Stairs to enter Entrance Stairs-Rails: Right;Left;Can reach both Entrance Stairs-Number of Steps: 2 Home Layout: Two level;Able to live on main level with bedroom/bathroom Home Equipment: Shower seat;Cane - single point      Prior Function Level of Independence: Independent         Comments: Patient states she is independent without AD, household and limited  community ambulator     Journalist, newspaper        Extremity/Trunk Assessment   Upper Extremity Assessment Upper Extremity Assessment: Generalized weakness    Lower Extremity Assessment Lower  Extremity Assessment: Generalized weakness       Communication   Communication: No difficulties  Cognition Arousal/Alertness: Awake/alert Behavior During Therapy: WFL for tasks assessed/performed Overall Cognitive Status: Within Functional Limits for tasks assessed                                        General Comments      Exercises     Assessment/Plan    PT Assessment Patient needs continued PT services  PT Problem List Decreased strength;Decreased activity tolerance;Decreased balance;Decreased mobility;Decreased safety awareness;Decreased knowledge of use of DME       PT Treatment Interventions DME instruction;Balance training;Gait training;Neuromuscular re-education;Stair training;Functional mobility training;Patient/family education;Therapeutic activities;Therapeutic exercise    PT Goals (Current goals can be found in the Care Plan section)  Acute Rehab PT Goals Patient Stated Goal: return home with family PT Goal Formulation: With patient Time For Goal Achievement: 02/18/20 Potential to Achieve Goals: Good    Frequency Min 3X/week   Barriers to discharge        Co-evaluation               AM-PAC PT "6 Clicks" Mobility  Outcome Measure Help needed turning from your back to your side while in a flat bed without using bedrails?: None Help needed moving from lying on your back to sitting on the side of a flat bed without using bedrails?: None Help needed moving to and from a bed to a chair (including a wheelchair)?: A Little Help needed standing up from a chair using your arms (e.g., wheelchair or bedside chair)?: A Little Help needed to walk in hospital room?: A Little Help needed climbing 3-5 steps with a railing? : A Lot 6 Click Score: 19    End of Session Equipment Utilized During Treatment: Gait belt Activity Tolerance: Patient tolerated treatment well;Patient limited by fatigue Patient left: in bed;with call bell/phone within  reach;with bed alarm set Nurse Communication: Mobility status PT Visit Diagnosis: Unsteadiness on feet (R26.81);Other abnormalities of gait and mobility (R26.89);Muscle weakness (generalized) (M62.81)    Time: 2878-6767 PT Time Calculation (min) (ACUTE ONLY): 22 min   Charges:   PT Evaluation $PT Eval Low Complexity: 1 Low PT Treatments $Therapeutic Activity: 8-22 mins        11:43 AM, 02/11/20 Mearl Latin PT, DPT Physical Therapist at [facility]

## 2020-02-11 NOTE — Progress Notes (Signed)
IV's removed and discharge instructions reviewed with son.  Scripts sent to pharmacy and transported by [facility] to car.

## 2020-02-11 NOTE — Telephone Encounter (Signed)
TRANSITIONAL CARE MANAGEMENT TELEPHONE OUTREACH NOTE   Contact Date: 02/11/2020 Contacted By: Lynnea Ferrier   DISCHARGE INFORMATION Date of Discharge:02/11/20 Discharge Facility: [facility] Discharge Diagnosis:Sepsis -Present on admission -Presented with lactic acid 2.1 and WBC 15.3 -Secondary to pneumoniaand UTI -Judicious IV fluids for 24 hours -Continue Zosyn initially   Outpatient Follow Up Recommendations (copied from discharge summary) 1. Follow up with PCP in 1-2 weeks 2. Please obtain BMP/CBC in one week  Laura Jacobs is a female primary care patient of [name], FNP. An outgoing telephone call was made today and I spoke with patient.  Laura Jacobs condition(s) and treatment(s) were discussed. An opportunity to ask questions was provided and all were answered or forwarded as appropriate.    ACTIVITIES OF DAILY LIVING  Laura Jacobs lives with their family and she can perform ADLs independently. her primary caregiver is her two sons, daughter, and daughter in law. she is able to depend on her primary caregiver(s) for consistent help. Transportation to appointments, to pick up medications, and to run errands is not a problem.  (Consider referral to [facility] if transportation or a consistent caregiver is a problem)   Fall Risk Fall Risk  11/15/2019 05/28/2019  Falls in the past year? 0 0  Number falls in past yr: - -  Injury with Fall? - -  Comment - -    medium Laura Jacobs/Assistive Devices Wheelchair: No Cane: No Ramp: No Bedside Toilet: No Hospital Bed:  No Other:    Maybeury she is not receiving home health  services.     MEDICATION RECONCILIATION  [name] has been able to pick-up all prescribed discharge medications from the pharmacy.   A post discharge medication reconciliation was performed and the complete medication list was reviewed with the patient/caregiver and  is current as of 02/11/2020. Changes highlighted below.  Discontinued Medications STOP taking these medications   amLODipine 5 MG tablet Commonly known as: NORVAS     Current Medication List Allergies as of 02/11/2020   No Known Allergies     Medication List       Accurate as of Feb 11, 2020  3:19 PM. If you have any questions, ask your nurse or doctor.        apixaban 2.5 MG Tabs tablet Commonly known as: ELIQUIS Take 1 tablet (2.5 mg total) by mouth 2 (two) times daily.   atorvastatin 40 MG tablet Commonly known as: LIPITOR TAKE 1 TABLET ONCE DAILY IN THE EVENING What changed:   how much to take  how to take this  when to take this  additional instructions   azithromycin 500 MG tablet Commonly known as: ZITHROMAX Take 1 tablet (500 mg total) by mouth daily. Start taking on: Feb 12, 2020   calcium-vitamin D 500-200 MG-UNIT tablet Commonly known as: Oscal 500/200 D-3 Take 1 tablet by mouth 2 (two) times daily.   cefdinir 300 MG capsule Commonly known as: OMNICEF Take 1 capsule (300 mg total) by mouth every 12 (twelve) hours.   citalopram 20 MG tablet Commonly known as: CeleXA Take 1 tablet (20 mg total) by mouth daily.   ezetimibe 10 MG tablet Commonly known as: ZETIA Take 1 tablet (10 mg total) by mouth daily.   furosemide 40 MG tablet Commonly known as: LASIX Take 1 tablet (40 mg total) by mouth daily.   metoprolol tartrate 25 MG tablet Commonly known as: LOPRESSOR TAKE  (1)  TABLET TWICE A DAY. What changed:   how much to take  how to take this  when to take this  additional instructions   Nitrostat 0.4 MG SL tablet Generic drug: nitroGLYCERIN PLACE 1 TABLET UNDER THE TONGUE EVERY 5 MINUTES AS NEEDED FOR CHEST PA IN   potassium chloride SA 20 MEQ tablet Commonly known as: Klor-Con M20 Take 1 tablet (20 mEq total) by mouth 2 (two) times daily.        PATIENT EDUCATION & FOLLOW-UP PLAN  An appointment for Transitional Care  Management is scheduled with [name], FNP on 02/17/20 at 11:00am.  Take all medications as prescribed  Contact our office by calling [phone] if you have any questions or concerns

## 2020-02-11 NOTE — Plan of Care (Signed)
  Problem: Acute Rehab PT Goals(only PT should resolve) Goal: Patient Will Transfer Sit To/From Stand Outcome: Progressing Flowsheets (Taken 02/11/2020 1145) Patient will transfer sit to/from stand: with supervision Goal: Pt Will Transfer Bed To Chair/Chair To Bed Outcome: Progressing Flowsheets (Taken 02/11/2020 1145) Pt will Transfer Bed to Chair/Chair to Bed: with supervision Goal: Pt Will Ambulate Outcome: Progressing Flowsheets (Taken 02/11/2020 1145) Pt will Ambulate:  50 feet  with min guard assist  with least restrictive assistive device Goal: Pt/caregiver will Perform Home Exercise Program Outcome: Progressing Flowsheets (Taken 02/11/2020 1145) Pt/caregiver will Perform Home Exercise Program:  For increased strengthening  For improved balance  Independently  11:45 AM, 02/11/20 Mearl Latin PT, DPT Physical Therapist at [facility]

## 2020-02-11 NOTE — Progress Notes (Signed)
Ate about half of supper tray last night and denied any nausea throughout the night and eating about half of breakfast tray.  Had one loose stool yesterday and night nurse reported two last night.

## 2020-02-14 LAB — CULTURE, BLOOD (ROUTINE X 2)
Culture: NO GROWTH
Culture: NO GROWTH
Special Requests: ADEQUATE
Special Requests: ADEQUATE

## 2020-02-15 ENCOUNTER — Ambulatory Visit: Payer: Self-pay | Admitting: Nurse Practitioner

## 2020-02-17 ENCOUNTER — Encounter: Payer: Self-pay | Admitting: Nurse Practitioner

## 2020-02-17 ENCOUNTER — Ambulatory Visit (INDEPENDENT_AMBULATORY_CARE_PROVIDER_SITE_OTHER): Payer: Medicare Other | Admitting: Nurse Practitioner

## 2020-02-17 VITALS — BP 119/75 | HR 95 | Temp 96.7°F | Resp 20 | Ht 63.0 in | Wt 115.0 lb

## 2020-02-17 DIAGNOSIS — Z09 Encounter for follow-up examination after completed treatment for conditions other than malignant neoplasm: Secondary | ICD-10-CM

## 2020-02-17 DIAGNOSIS — A419 Sepsis, unspecified organism: Secondary | ICD-10-CM

## 2020-02-17 DIAGNOSIS — J189 Pneumonia, unspecified organism: Secondary | ICD-10-CM

## 2020-02-17 NOTE — Progress Notes (Signed)
Subjective:    Patient ID: Laura Jacobs, female    DOB: [birth year], 86 y.o.   MRN: [id number]   Chief Complaint: Hospitalization Follow-up   HPI Today's visit is for Transitional Care Management.  The patient was discharged from [facility] on 02/11/20 with a primary diagnosis of sepis secondary to pneumonia.   Contact with the patient and/or caregiver, by a clinical staff member, was made on 02/11/20 and was documented as a telephone encounter within the EMR.  Through chart review and discussion with the patient I have determined that management of their condition is of moderate complexity.    Patient wa admitted to the hospital on 02/08/20 with sepsis. was dx with pneumonia. She was in the hospital for 3 days. Came home on cefdinir and is doing well. She says she is feeling much better. Daughter says she has a poor appetite.    Review of Systems  Constitutional: Negative.   Respiratory: Negative for cough, chest tightness and shortness of breath.   Cardiovascular: Negative for chest pain.  Gastrointestinal: Negative.   Genitourinary: Negative.   Neurological: Negative.   Psychiatric/Behavioral: Negative.   All other systems reviewed and are negative.      Objective:   Physical Exam Vitals and nursing note reviewed.  Constitutional:      General: She is not in acute distress.    Appearance: Normal appearance. She is well-developed.  HENT:     Head: Normocephalic.     Nose: Nose normal.  Eyes:     Pupils: Pupils are equal, round, and reactive to light.  Neck:     Vascular: No carotid bruit or JVD.  Cardiovascular:     Rate and Rhythm: Normal rate and regular rhythm.     Heart sounds: Normal heart sounds.  Pulmonary:     Effort: Pulmonary effort is normal. No respiratory distress.     Breath sounds: Normal breath sounds. No wheezing or rales.  Chest:     Chest wall: No tenderness.  Abdominal:     General: Bowel sounds are normal. There is no distension or  abdominal bruit.     Palpations: Abdomen is soft. There is no hepatomegaly, splenomegaly, mass or pulsatile mass.     Tenderness: There is no abdominal tenderness.  Musculoskeletal:        General: Normal range of motion.     Cervical back: Normal range of motion and neck supple.  Lymphadenopathy:     Cervical: No cervical adenopathy.  Skin:    General: Skin is warm and dry.  Neurological:     Mental Status: She is alert and oriented to person, place, and time.     Deep Tendon Reflexes: Reflexes are normal and symmetric.  Psychiatric:        Behavior: Behavior normal.        Thought Content: Thought content normal.        Judgment: Judgment normal.     BP 119/75   Pulse 95   Temp (!) 96.7 F (35.9 C) (Temporal)   Resp 20   Ht '5\' 3"'$  (1.6 m)   Wt 115 lb (52.2 kg)   SpO2 98%   BMI 20.37 kg/m        Assessment & Plan:  Laura Jacobs in today with chief complaint of Hospitalization Follow-up   1. Sepsis due to pneumonia [facility]) finih antibiotic a rx' labs pending - BMP8+EGFR - CBC with Differential/Platelet  2. Hospital discharge follow-up hospital record reviewed  The above assessment and management plan was discussed with the patient. The patient verbalized understanding of and has agreed to the management plan. Patient is aware to call the clinic if symptoms persist or worsen. Patient is aware when to return to the clinic for a follow-up visit. Patient educated on when it is appropriate to go to the emergency department.   Mary-Margaret Ester Mabe, FNP   

## 2020-02-17 NOTE — Patient Instructions (Signed)

## 2020-02-18 LAB — CBC WITH DIFFERENTIAL/PLATELET
Basophils Absolute: 0.1 10*3/uL (ref 0.0–0.2)
Basos: 1 %
EOS (ABSOLUTE): 0 10*3/uL (ref 0.0–0.4)
Eos: 0 %
Hematocrit: 34.7 % (ref 34.0–46.6)
Hemoglobin: 11.6 g/dL (ref 11.1–15.9)
Immature Grans (Abs): 0.1 10*3/uL (ref 0.0–0.1)
Immature Granulocytes: 1 %
Lymphocytes Absolute: 1.3 10*3/uL (ref 0.7–3.1)
Lymphs: 13 %
MCH: 30.8 pg (ref 26.6–33.0)
MCHC: 33.4 g/dL (ref 31.5–35.7)
MCV: 92 fL (ref 79–97)
Monocytes Absolute: 0.8 10*3/uL (ref 0.1–0.9)
Monocytes: 8 %
Neutrophils Absolute: 7.9 10*3/uL — ABNORMAL HIGH (ref 1.4–7.0)
Neutrophils: 77 %
Platelets: 315 10*3/uL (ref 150–450)
RBC: 3.77 x10E6/uL (ref 3.77–5.28)
RDW: 12.8 % (ref 11.7–15.4)
WBC: 10.1 10*3/uL (ref 3.4–10.8)

## 2020-02-18 LAB — BMP8+EGFR
BUN/Creatinine Ratio: 11 — ABNORMAL LOW (ref 12–28)
BUN: 19 mg/dL (ref 8–27)
CO2: 23 mmol/L (ref 20–29)
Calcium: 9.4 mg/dL (ref 8.7–10.3)
Chloride: 100 mmol/L (ref 96–106)
Creatinine, Ser: 1.78 mg/dL — ABNORMAL HIGH (ref 0.57–1.00)
GFR calc Af Amer: 30 mL/min/{1.73_m2} — ABNORMAL LOW (ref >59)
GFR calc non Af Amer: 26 mL/min/{1.73_m2} — ABNORMAL LOW (ref >59)
Glucose: 83 mg/dL (ref 65–99)
Potassium: 4.7 mmol/L (ref 3.5–5.2)
Sodium: 141 mmol/L (ref 134–144)

## 2020-03-02 ENCOUNTER — Ambulatory Visit: Payer: Self-pay | Admitting: Nurse Practitioner

## 2020-03-02 DIAGNOSIS — I4891 Unspecified atrial fibrillation: Secondary | ICD-10-CM | POA: Diagnosis not present

## 2020-03-02 DIAGNOSIS — R05 Cough: Secondary | ICD-10-CM | POA: Diagnosis not present

## 2020-03-02 DIAGNOSIS — J449 Chronic obstructive pulmonary disease, unspecified: Secondary | ICD-10-CM | POA: Diagnosis not present

## 2020-03-02 DIAGNOSIS — R600 Localized edema: Secondary | ICD-10-CM | POA: Diagnosis not present

## 2020-03-02 DIAGNOSIS — R5383 Other fatigue: Secondary | ICD-10-CM | POA: Diagnosis not present

## 2020-03-08 ENCOUNTER — Telehealth: Payer: Self-pay | Admitting: Nurse Practitioner

## 2020-03-08 NOTE — Telephone Encounter (Signed)
Pt scheduled with MMM 03/13/20 at 9 for follow up from Urgent Care visit 03/02/20.

## 2020-03-13 ENCOUNTER — Ambulatory Visit (INDEPENDENT_AMBULATORY_CARE_PROVIDER_SITE_OTHER): Payer: Medicare Other | Admitting: Nurse Practitioner

## 2020-03-13 ENCOUNTER — Encounter: Payer: Self-pay | Admitting: Nurse Practitioner

## 2020-03-13 ENCOUNTER — Other Ambulatory Visit: Payer: Self-pay

## 2020-03-13 VITALS — BP 104/75 | HR 110 | Temp 96.6°F | Resp 20

## 2020-03-13 DIAGNOSIS — Z09 Encounter for follow-up examination after completed treatment for conditions other than malignant neoplasm: Secondary | ICD-10-CM

## 2020-03-13 DIAGNOSIS — I2510 Atherosclerotic heart disease of native coronary artery without angina pectoris: Secondary | ICD-10-CM

## 2020-03-13 DIAGNOSIS — R609 Edema, unspecified: Secondary | ICD-10-CM | POA: Diagnosis not present

## 2020-03-13 DIAGNOSIS — R531 Weakness: Secondary | ICD-10-CM | POA: Diagnosis not present

## 2020-03-13 NOTE — Patient Instructions (Signed)

## 2020-03-13 NOTE — Progress Notes (Signed)
   Subjective:    Patient ID: Laura Jacobs, female    DOB: 1934-02-09, 86 y.o.   MRN: [id number]   Chief Complaint: Hospitalization Follow-up   HPI Pt seen at UC ~2 weeks ago for bilat leg swelling, worse on right leg. CXR was clear and prescribed compression socks. States achy feeling in right leg. Pt usually ambulates alone but is currently in wheelchair.   Review of Systems  Constitutional: Positive for activity change and unexpected weight change. Negative for diaphoresis.  Eyes: Negative for pain.  Respiratory: Negative for shortness of breath.   Cardiovascular: Positive for leg swelling. Negative for chest pain and palpitations.  Gastrointestinal: Negative for abdominal pain.  Endocrine: Negative for polydipsia.  Skin: Negative for rash.  Neurological: Positive for weakness. Negative for dizziness and headaches.  Hematological: Does not bruise/bleed easily.  All other systems reviewed and are negative.      Objective:   Physical Exam Vitals and nursing note reviewed.  Constitutional:      Appearance: She is ill-appearing.  Cardiovascular:     Rate and Rhythm: Normal rate and regular rhythm.     Heart sounds: Normal heart sounds.  Pulmonary:     Effort: Pulmonary effort is normal.     Breath sounds: Rhonchi (exp throughout) present.  Musculoskeletal:        General: No swelling.     Right lower leg: No edema.     Left lower leg: No edema.     Comments: compression socks in place  Skin:    General: Skin is warm.  Neurological:     General: No focal deficit present.     Mental Status: She is alert and oriented to person, place, and time.  Psychiatric:        Mood and Affect: Mood normal.        Behavior: Behavior normal.     BP 104/75   Pulse (!) 110   Temp (!) 96.6 F (35.9 C) (Temporal)   Resp 20   SpO2 94%        Assessment & Plan:  EMSLEY CUSTER in today with chief complaint of Hospitalization Follow-up   1. Coronary artery  disease involving native coronary artery of native heart without angina pectoris cardiologya s needed  2. Peripheral edema continueto wear compression sock elevate leg when sitting  3. Hospital discharge follow-up hospital records reviewed  4. Weakness Fall prevention - Ambulatory referral to [place]    The above assessment and management plan was discussed with the patient. The patient verbalized understanding of and has agreed to the management plan. Patient is aware to call the clinic if symptoms persist or worsen. Patient is aware when to return to the clinic for a follow-up visit. Patient educated on when it is appropriate to go to the emergency department.   [name], FNP

## 2020-03-16 DIAGNOSIS — M6281 Muscle weakness (generalized): Secondary | ICD-10-CM | POA: Diagnosis not present

## 2020-03-16 DIAGNOSIS — E559 Vitamin D deficiency, unspecified: Secondary | ICD-10-CM | POA: Diagnosis not present

## 2020-03-16 DIAGNOSIS — Z87891 Personal history of nicotine dependence: Secondary | ICD-10-CM | POA: Diagnosis not present

## 2020-03-16 DIAGNOSIS — I2510 Atherosclerotic heart disease of native coronary artery without angina pectoris: Secondary | ICD-10-CM | POA: Diagnosis not present

## 2020-03-16 DIAGNOSIS — N184 Chronic kidney disease, stage 4 (severe): Secondary | ICD-10-CM | POA: Diagnosis not present

## 2020-03-16 DIAGNOSIS — M810 Age-related osteoporosis without current pathological fracture: Secondary | ICD-10-CM | POA: Diagnosis not present

## 2020-03-16 DIAGNOSIS — Z7901 Long term (current) use of anticoagulants: Secondary | ICD-10-CM | POA: Diagnosis not present

## 2020-03-16 DIAGNOSIS — Z87440 Personal history of urinary (tract) infections: Secondary | ICD-10-CM | POA: Diagnosis not present

## 2020-03-16 DIAGNOSIS — I480 Paroxysmal atrial fibrillation: Secondary | ICD-10-CM | POA: Diagnosis not present

## 2020-03-16 DIAGNOSIS — I5022 Chronic systolic (congestive) heart failure: Secondary | ICD-10-CM | POA: Diagnosis not present

## 2020-03-16 DIAGNOSIS — I130 Hypertensive heart and chronic kidney disease with heart failure and stage 1 through stage 4 chronic kidney disease, or unspecified chronic kidney disease: Secondary | ICD-10-CM | POA: Diagnosis not present

## 2020-03-16 DIAGNOSIS — E785 Hyperlipidemia, unspecified: Secondary | ICD-10-CM | POA: Diagnosis not present

## 2020-03-17 DIAGNOSIS — Z87440 Personal history of urinary (tract) infections: Secondary | ICD-10-CM | POA: Diagnosis not present

## 2020-03-17 DIAGNOSIS — I5022 Chronic systolic (congestive) heart failure: Secondary | ICD-10-CM | POA: Diagnosis not present

## 2020-03-17 DIAGNOSIS — I480 Paroxysmal atrial fibrillation: Secondary | ICD-10-CM | POA: Diagnosis not present

## 2020-03-17 DIAGNOSIS — I130 Hypertensive heart and chronic kidney disease with heart failure and stage 1 through stage 4 chronic kidney disease, or unspecified chronic kidney disease: Secondary | ICD-10-CM | POA: Diagnosis not present

## 2020-03-17 DIAGNOSIS — M810 Age-related osteoporosis without current pathological fracture: Secondary | ICD-10-CM | POA: Diagnosis not present

## 2020-03-17 DIAGNOSIS — E785 Hyperlipidemia, unspecified: Secondary | ICD-10-CM | POA: Diagnosis not present

## 2020-03-17 DIAGNOSIS — M6281 Muscle weakness (generalized): Secondary | ICD-10-CM | POA: Diagnosis not present

## 2020-03-17 DIAGNOSIS — E559 Vitamin D deficiency, unspecified: Secondary | ICD-10-CM | POA: Diagnosis not present

## 2020-03-17 DIAGNOSIS — Z7901 Long term (current) use of anticoagulants: Secondary | ICD-10-CM | POA: Diagnosis not present

## 2020-03-17 DIAGNOSIS — Z87891 Personal history of nicotine dependence: Secondary | ICD-10-CM | POA: Diagnosis not present

## 2020-03-17 DIAGNOSIS — I2510 Atherosclerotic heart disease of native coronary artery without angina pectoris: Secondary | ICD-10-CM | POA: Diagnosis not present

## 2020-03-17 DIAGNOSIS — N184 Chronic kidney disease, stage 4 (severe): Secondary | ICD-10-CM | POA: Diagnosis not present

## 2020-03-20 DIAGNOSIS — M810 Age-related osteoporosis without current pathological fracture: Secondary | ICD-10-CM | POA: Diagnosis not present

## 2020-03-20 DIAGNOSIS — I5022 Chronic systolic (congestive) heart failure: Secondary | ICD-10-CM | POA: Diagnosis not present

## 2020-03-20 DIAGNOSIS — I480 Paroxysmal atrial fibrillation: Secondary | ICD-10-CM | POA: Diagnosis not present

## 2020-03-20 DIAGNOSIS — N184 Chronic kidney disease, stage 4 (severe): Secondary | ICD-10-CM | POA: Diagnosis not present

## 2020-03-20 DIAGNOSIS — E785 Hyperlipidemia, unspecified: Secondary | ICD-10-CM | POA: Diagnosis not present

## 2020-03-20 DIAGNOSIS — I130 Hypertensive heart and chronic kidney disease with heart failure and stage 1 through stage 4 chronic kidney disease, or unspecified chronic kidney disease: Secondary | ICD-10-CM | POA: Diagnosis not present

## 2020-03-20 DIAGNOSIS — E559 Vitamin D deficiency, unspecified: Secondary | ICD-10-CM | POA: Diagnosis not present

## 2020-03-20 DIAGNOSIS — Z7901 Long term (current) use of anticoagulants: Secondary | ICD-10-CM | POA: Diagnosis not present

## 2020-03-20 DIAGNOSIS — I2510 Atherosclerotic heart disease of native coronary artery without angina pectoris: Secondary | ICD-10-CM | POA: Diagnosis not present

## 2020-03-20 DIAGNOSIS — M6281 Muscle weakness (generalized): Secondary | ICD-10-CM | POA: Diagnosis not present

## 2020-03-20 DIAGNOSIS — Z87891 Personal history of nicotine dependence: Secondary | ICD-10-CM | POA: Diagnosis not present

## 2020-03-20 DIAGNOSIS — Z87440 Personal history of urinary (tract) infections: Secondary | ICD-10-CM | POA: Diagnosis not present

## 2020-03-21 ENCOUNTER — Ambulatory Visit (INDEPENDENT_AMBULATORY_CARE_PROVIDER_SITE_OTHER): Payer: Medicare Other | Admitting: Family Medicine

## 2020-03-21 ENCOUNTER — Telehealth: Payer: Self-pay | Admitting: Nurse Practitioner

## 2020-03-21 ENCOUNTER — Encounter: Payer: Self-pay | Admitting: Family Medicine

## 2020-03-21 DIAGNOSIS — R0789 Other chest pain: Secondary | ICD-10-CM

## 2020-03-21 DIAGNOSIS — R630 Anorexia: Secondary | ICD-10-CM

## 2020-03-21 DIAGNOSIS — R0989 Other specified symptoms and signs involving the circulatory and respiratory systems: Secondary | ICD-10-CM | POA: Diagnosis not present

## 2020-03-21 MED ORDER — MIRTAZAPINE 7.5 MG PO TABS: 7.5000 mg | ORAL_TABLET | Freq: Every day | ORAL | 2 refills | Status: DC

## 2020-03-21 MED ORDER — ALBUTEROL SULFATE HFA 108 (90 BASE) MCG/ACT IN AERS: 2.0000 | INHALATION_SPRAY | Freq: Four times a day (QID) | RESPIRATORY_TRACT | 2 refills | Status: DC | PRN

## 2020-03-21 NOTE — Telephone Encounter (Signed)
Home health nurse came out and reports that patient has tightness and rattling in chest, cough, and decreased appetite.  Appointment scheduled for tele visit today with Hendricks Limes.

## 2020-03-21 NOTE — Progress Notes (Signed)
Virtual Visit via Telephone Note  I connected with CORLISS COGGESHALL on 03/21/20 at 3:19 PM by telephone and verified that I am speaking with the correct person using two identifiers. THANIA WOODLIEF is currently located at home and her granddaughter is currently with her during this visit. The provider, Loman Brooklyn, FNP is located in their home at time of visit.  I discussed the limitations, risks, security and privacy concerns of performing an evaluation and management service by telephone and the availability of in person appointments. I also discussed with the patient that there may be a patient responsible charge related to this service. The patient expressed understanding and agreed to proceed.  Subjective: PCP: [name], FNP  Chief Complaint  Patient presents with  . Cough  . Anorexia   Patient gave permission to speak to her granddaughter that lives with and cares for her.  Granddaughter reports physical therapy comes out twice weekly and has told her that they hear something in her lungs and that they sound tight.  She states she was told it sounded like phlegm building up and that she should take Mucinex but the granddaughter states she is unable to take Mucinex because of her heart, so she does not know what to give her.  The granddaughter also reports patient does not have a very good appetite.  It has been getting worse since her husband died at the end of last year.   ROS: Per HPI  Current Outpatient Medications:  .  apixaban (ELIQUIS) 2.5 MG TABS tablet, Take 1 tablet (2.5 mg total) by mouth 2 (two) times daily., Disp: 60 tablet, Rfl: 1 .  atorvastatin (LIPITOR) 40 MG tablet, TAKE 1 TABLET ONCE DAILY IN THE EVENING (Patient taking differently: Take 40 mg by mouth every evening. ), Disp: 90 tablet, Rfl: 1 .  calcium-vitamin D (OSCAL 500/200 D-3) 500-200 MG-UNIT per tablet, Take 1 tablet by mouth 2 (two) times daily., Disp: , Rfl:  .  citalopram  (CELEXA) 20 MG tablet, Take 1 tablet (20 mg total) by mouth daily., Disp: 30 tablet, Rfl: 5 .  ezetimibe (ZETIA) 10 MG tablet, Take 1 tablet (10 mg total) by mouth daily., Disp: 90 tablet, Rfl: 1 .  furosemide (LASIX) 40 MG tablet, Take 1 tablet (40 mg total) by mouth daily., Disp: 90 tablet, Rfl: 1 .  metoprolol tartrate (LOPRESSOR) 25 MG tablet, TAKE  (1)  TABLET TWICE A DAY. (Patient taking differently: Take 25 mg by mouth 2 (two) times daily. ), Disp: 180 tablet, Rfl: 1 .  NITROSTAT 0.4 MG SL tablet, PLACE 1 TABLET UNDER THE TONGUE EVERY 5 MINUTES AS NEEDED FOR CHEST PA IN, Disp: 25 tablet, Rfl: 0 .  potassium chloride SA (KLOR-CON M20) 20 MEQ tablet, Take 1 tablet (20 mEq total) by mouth 2 (two) times daily., Disp: 30 tablet, Rfl: 5  No Known Allergies Past Medical History:  Diagnosis Date  . Acute MI ([place])   . Atrial fibrillation, unspecified   . CAD (coronary artery disease)   . Gout   . Hyperlipidemia   . Hypertension     Observations/Objective: A&O  No respiratory distress or wheezing audible over the phone Mood, judgement, and thought processes all WNL   Assessment and Plan: 1. Chest tightness - Rx'd albuterol inhaler for chest tightness.  Explained how to use appropriately. - albuterol (VENTOLIN HFA) 108 (90 Base) MCG/ACT inhaler; Inhale 2 puffs into the lungs every 6 (six) hours as needed.  Dispense: 18 g;  Refill: 2  2. Chest congestion - Advised granddaughter she can give her plain Mucinex twice daily with a full glass of water.  3. Decreased appetite - Rx'd mirtazapine. - mirtazapine (REMERON) 7.5 MG tablet; Take 1 tablet (7.5 mg total) by mouth at bedtime.  Dispense: 30 tablet; Refill: 2   Follow Up Instructions: Return for 2-3 months for f/u appetite with PCP.  I discussed the assessment and treatment plan with the patient. The patient was provided an opportunity to ask questions and all were answered. The patient agreed with the plan and demonstrated an  understanding of the instructions.   The patient was advised to call back or seek an in-person evaluation if the symptoms worsen or if the condition fails to improve as anticipated.  The above assessment and management plan was discussed with the patient. The patient verbalized understanding of and has agreed to the management plan. Patient is aware to call the clinic if symptoms persist or worsen. Patient is aware when to return to the clinic for a follow-up visit. Patient educated on when it is appropriate to go to the emergency department.   Time call ended: 3:43 PM  I provided 26 minutes of non-face-to-face time during this encounter.  Hendricks Limes, MSN, APRN, FNP-C [facility] 03/21/20

## 2020-03-23 DIAGNOSIS — Z87440 Personal history of urinary (tract) infections: Secondary | ICD-10-CM | POA: Diagnosis not present

## 2020-03-23 DIAGNOSIS — I130 Hypertensive heart and chronic kidney disease with heart failure and stage 1 through stage 4 chronic kidney disease, or unspecified chronic kidney disease: Secondary | ICD-10-CM | POA: Diagnosis not present

## 2020-03-23 DIAGNOSIS — Z87891 Personal history of nicotine dependence: Secondary | ICD-10-CM | POA: Diagnosis not present

## 2020-03-23 DIAGNOSIS — I2510 Atherosclerotic heart disease of native coronary artery without angina pectoris: Secondary | ICD-10-CM | POA: Diagnosis not present

## 2020-03-23 DIAGNOSIS — M810 Age-related osteoporosis without current pathological fracture: Secondary | ICD-10-CM | POA: Diagnosis not present

## 2020-03-23 DIAGNOSIS — M6281 Muscle weakness (generalized): Secondary | ICD-10-CM | POA: Diagnosis not present

## 2020-03-23 DIAGNOSIS — Z7901 Long term (current) use of anticoagulants: Secondary | ICD-10-CM | POA: Diagnosis not present

## 2020-03-23 DIAGNOSIS — I5022 Chronic systolic (congestive) heart failure: Secondary | ICD-10-CM | POA: Diagnosis not present

## 2020-03-23 DIAGNOSIS — I480 Paroxysmal atrial fibrillation: Secondary | ICD-10-CM | POA: Diagnosis not present

## 2020-03-23 DIAGNOSIS — E559 Vitamin D deficiency, unspecified: Secondary | ICD-10-CM | POA: Diagnosis not present

## 2020-03-23 DIAGNOSIS — N184 Chronic kidney disease, stage 4 (severe): Secondary | ICD-10-CM | POA: Diagnosis not present

## 2020-03-23 DIAGNOSIS — E785 Hyperlipidemia, unspecified: Secondary | ICD-10-CM | POA: Diagnosis not present

## 2020-03-25 ENCOUNTER — Other Ambulatory Visit: Payer: Self-pay

## 2020-03-25 ENCOUNTER — Encounter (HOSPITAL_COMMUNITY): Payer: Self-pay

## 2020-03-25 ENCOUNTER — Emergency Department (HOSPITAL_COMMUNITY): Payer: Medicare Other

## 2020-03-25 ENCOUNTER — Inpatient Hospital Stay (HOSPITAL_COMMUNITY)
Admission: RE | Admit: 2020-03-25 | Discharge: 2020-03-27 | DRG: 178 | Disposition: A | Discharge status: Home Health | Payer: Medicare Other | Attending: Family Medicine | Admitting: Family Medicine

## 2020-03-25 DIAGNOSIS — N184 Chronic kidney disease, stage 4 (severe): Secondary | ICD-10-CM | POA: Diagnosis present

## 2020-03-25 DIAGNOSIS — Z20822 Contact with and (suspected) exposure to covid-19: Secondary | ICD-10-CM | POA: Diagnosis present

## 2020-03-25 DIAGNOSIS — Z87891 Personal history of nicotine dependence: Secondary | ICD-10-CM

## 2020-03-25 DIAGNOSIS — Z951 Presence of aortocoronary bypass graft: Secondary | ICD-10-CM | POA: Diagnosis not present

## 2020-03-25 DIAGNOSIS — I480 Paroxysmal atrial fibrillation: Secondary | ICD-10-CM | POA: Diagnosis present

## 2020-03-25 DIAGNOSIS — J440 Chronic obstructive pulmonary disease with acute lower respiratory infection: Secondary | ICD-10-CM | POA: Diagnosis not present

## 2020-03-25 DIAGNOSIS — Z7901 Long term (current) use of anticoagulants: Secondary | ICD-10-CM

## 2020-03-25 DIAGNOSIS — E785 Hyperlipidemia, unspecified: Secondary | ICD-10-CM | POA: Diagnosis present

## 2020-03-25 DIAGNOSIS — R7989 Other specified abnormal findings of blood chemistry: Secondary | ICD-10-CM

## 2020-03-25 DIAGNOSIS — R0600 Dyspnea, unspecified: Secondary | ICD-10-CM

## 2020-03-25 DIAGNOSIS — I130 Hypertensive heart and chronic kidney disease with heart failure and stage 1 through stage 4 chronic kidney disease, or unspecified chronic kidney disease: Secondary | ICD-10-CM | POA: Diagnosis present

## 2020-03-25 DIAGNOSIS — I482 Chronic atrial fibrillation, unspecified: Secondary | ICD-10-CM | POA: Diagnosis not present

## 2020-03-25 DIAGNOSIS — F0390 Unspecified dementia without behavioral disturbance: Secondary | ICD-10-CM | POA: Diagnosis present

## 2020-03-25 DIAGNOSIS — I2510 Atherosclerotic heart disease of native coronary artery without angina pectoris: Secondary | ICD-10-CM | POA: Diagnosis not present

## 2020-03-25 DIAGNOSIS — E860 Dehydration: Secondary | ICD-10-CM | POA: Diagnosis present

## 2020-03-25 DIAGNOSIS — D649 Anemia, unspecified: Secondary | ICD-10-CM | POA: Diagnosis not present

## 2020-03-25 DIAGNOSIS — R05 Cough: Secondary | ICD-10-CM | POA: Diagnosis not present

## 2020-03-25 DIAGNOSIS — Z8701 Personal history of pneumonia (recurrent): Secondary | ICD-10-CM

## 2020-03-25 DIAGNOSIS — J690 Pneumonitis due to inhalation of food and vomit: Secondary | ICD-10-CM | POA: Diagnosis not present

## 2020-03-25 DIAGNOSIS — Z681 Body mass index (BMI) 19 or less, adult: Secondary | ICD-10-CM

## 2020-03-25 DIAGNOSIS — R64 Cachexia: Secondary | ICD-10-CM | POA: Diagnosis present

## 2020-03-25 DIAGNOSIS — Z79899 Other long term (current) drug therapy: Secondary | ICD-10-CM

## 2020-03-25 DIAGNOSIS — I5032 Chronic diastolic (congestive) heart failure: Secondary | ICD-10-CM | POA: Diagnosis present

## 2020-03-25 DIAGNOSIS — N289 Disorder of kidney and ureter, unspecified: Secondary | ICD-10-CM

## 2020-03-25 DIAGNOSIS — I517 Cardiomegaly: Secondary | ICD-10-CM | POA: Diagnosis not present

## 2020-03-25 DIAGNOSIS — I119 Hypertensive heart disease without heart failure: Secondary | ICD-10-CM

## 2020-03-25 DIAGNOSIS — J189 Pneumonia, unspecified organism: Secondary | ICD-10-CM | POA: Diagnosis present

## 2020-03-25 DIAGNOSIS — R0789 Other chest pain: Secondary | ICD-10-CM

## 2020-03-25 DIAGNOSIS — F329 Major depressive disorder, single episode, unspecified: Secondary | ICD-10-CM | POA: Diagnosis present

## 2020-03-25 DIAGNOSIS — F411 Generalized anxiety disorder: Secondary | ICD-10-CM

## 2020-03-25 DIAGNOSIS — E782 Mixed hyperlipidemia: Secondary | ICD-10-CM

## 2020-03-25 DIAGNOSIS — R17 Unspecified jaundice: Secondary | ICD-10-CM

## 2020-03-25 DIAGNOSIS — R630 Anorexia: Secondary | ICD-10-CM

## 2020-03-25 LAB — LACTIC ACID, PLASMA
Lactic Acid, Venous: 1.4 mmol/L (ref 0.5–1.9)
Lactic Acid, Venous: 1.7 mmol/L (ref 0.5–1.9)

## 2020-03-25 LAB — CBC WITH DIFFERENTIAL/PLATELET
Abs Immature Granulocytes: 0.26 10*3/uL — ABNORMAL HIGH (ref 0.00–0.07)
Basophils Absolute: 0.1 10*3/uL (ref 0.0–0.1)
Basophils Relative: 0 %
Eosinophils Absolute: 0.1 10*3/uL (ref 0.0–0.5)
Eosinophils Relative: 1 %
HCT: 35.9 % — ABNORMAL LOW (ref 36.0–46.0)
Hemoglobin: 11.4 g/dL — ABNORMAL LOW (ref 12.0–15.0)
Immature Granulocytes: 1 %
Lymphocytes Relative: 4 %
Lymphs Abs: 1.1 10*3/uL (ref 0.7–4.0)
MCH: 29.8 pg (ref 26.0–34.0)
MCHC: 31.8 g/dL (ref 30.0–36.0)
MCV: 94 fL (ref 80.0–100.0)
Monocytes Absolute: 1.9 10*3/uL — ABNORMAL HIGH (ref 0.1–1.0)
Monocytes Relative: 8 %
Neutro Abs: 20.5 10*3/uL — ABNORMAL HIGH (ref 1.7–7.7)
Neutrophils Relative %: 86 %
Platelets: 327 10*3/uL (ref 150–400)
RBC: 3.82 MIL/uL — ABNORMAL LOW (ref 3.87–5.11)
RDW: 14.6 % (ref 11.5–15.5)
WBC: 23.9 10*3/uL — ABNORMAL HIGH (ref 4.0–10.5)
nRBC: 0 % (ref 0.0–0.2)

## 2020-03-25 LAB — COMPREHENSIVE METABOLIC PANEL
ALT: 18 U/L (ref 0–44)
AST: 24 U/L (ref 15–41)
Albumin: 3.3 g/dL — ABNORMAL LOW (ref 3.5–5.0)
Alkaline Phosphatase: 88 U/L (ref 38–126)
Anion gap: 11 (ref 5–15)
BUN: 32 mg/dL — ABNORMAL HIGH (ref 8–23)
CO2: 27 mmol/L (ref 22–32)
Calcium: 9 mg/dL (ref 8.9–10.3)
Chloride: 94 mmol/L — ABNORMAL LOW (ref 98–111)
Creatinine, Ser: 1.87 mg/dL — ABNORMAL HIGH (ref 0.44–1.00)
GFR calc Af Amer: 28 mL/min — ABNORMAL LOW (ref >60)
GFR calc non Af Amer: 24 mL/min — ABNORMAL LOW (ref >60)
Glucose, Bld: 126 mg/dL — ABNORMAL HIGH (ref 70–99)
Potassium: 4 mmol/L (ref 3.5–5.1)
Sodium: 132 mmol/L — ABNORMAL LOW (ref 135–145)
Total Bilirubin: 1.8 mg/dL — ABNORMAL HIGH (ref 0.3–1.2)
Total Protein: 7.8 g/dL (ref 6.5–8.1)

## 2020-03-25 LAB — HIV ANTIBODY (ROUTINE TESTING W REFLEX): HIV Screen 4th Generation wRfx: NONREACTIVE

## 2020-03-25 LAB — SARS CORONAVIRUS 2 BY RT PCR (HOSPITAL ORDER, PERFORMED IN ~~LOC~~ HOSPITAL LAB): SARS Coronavirus 2: NEGATIVE

## 2020-03-25 MED ORDER — APIXABAN 2.5 MG PO TABS
2.5000 mg | ORAL_TABLET | Freq: Two times a day (BID) | ORAL | Status: DC
  Administered 2020-03-25 – 2020-03-27 (×5): 2.5 mg via ORAL
  Filled 2020-03-25 (×10): qty 1

## 2020-03-25 MED ORDER — SODIUM CHLORIDE 0.9 % IV SOLN: INTRAVENOUS | Status: DC

## 2020-03-25 MED ORDER — LACTATED RINGERS IV BOLUS
1000.0000 mL | Freq: Once | INTRAVENOUS | Status: AC
  Administered 2020-03-25: 1000 mL via INTRAVENOUS

## 2020-03-25 MED ORDER — CITALOPRAM HYDROBROMIDE 20 MG PO TABS
20.0000 mg | ORAL_TABLET | Freq: Every day | ORAL | Status: DC
  Administered 2020-03-25 – 2020-03-27 (×3): 20 mg via ORAL
  Filled 2020-03-25 (×3): qty 1

## 2020-03-25 MED ORDER — ATORVASTATIN CALCIUM 40 MG PO TABS
40.0000 mg | ORAL_TABLET | Freq: Every day | ORAL | Status: DC
  Administered 2020-03-25 – 2020-03-26 (×2): 40 mg via ORAL
  Filled 2020-03-25 (×2): qty 1

## 2020-03-25 MED ORDER — SODIUM CHLORIDE 0.9 % IV SOLN
2.0000 g | Freq: Once | INTRAVENOUS | Status: AC
  Administered 2020-03-25: 2 g via INTRAVENOUS
  Filled 2020-03-25: qty 20

## 2020-03-25 MED ORDER — SODIUM CHLORIDE 0.9 % IV SOLN
1.0000 g | INTRAVENOUS | Status: DC
  Administered 2020-03-25 – 2020-03-26 (×2): 1 g via INTRAVENOUS
  Filled 2020-03-25 (×2): qty 10

## 2020-03-25 MED ORDER — AZITHROMYCIN 250 MG PO TABS
500.0000 mg | ORAL_TABLET | Freq: Every day | ORAL | Status: DC
  Administered 2020-03-25 – 2020-03-26 (×2): 500 mg via ORAL
  Filled 2020-03-25 (×2): qty 2

## 2020-03-25 MED ORDER — SODIUM CHLORIDE 0.9 % IV SOLN
500.0000 mg | Freq: Once | INTRAVENOUS | Status: AC
  Administered 2020-03-25: 500 mg via INTRAVENOUS
  Filled 2020-03-25: qty 500

## 2020-03-25 MED ORDER — METOPROLOL TARTRATE 50 MG PO TABS
25.0000 mg | ORAL_TABLET | Freq: Two times a day (BID) | ORAL | Status: DC
  Administered 2020-03-25: 25 mg via ORAL
  Filled 2020-03-25 (×2): qty 1

## 2020-03-25 MED ORDER — MIRTAZAPINE 15 MG PO TABS
7.5000 mg | ORAL_TABLET | Freq: Every day | ORAL | Status: DC
  Administered 2020-03-25 – 2020-03-26 (×2): 7.5 mg via ORAL
  Filled 2020-03-25 (×2): qty 1

## 2020-03-25 MED ORDER — IPRATROPIUM-ALBUTEROL 0.5-2.5 (3) MG/3ML IN SOLN: 3.0000 mL | RESPIRATORY_TRACT | Status: DC | PRN

## 2020-03-25 NOTE — ED Provider Notes (Signed)
[facility] Provider Note   CSN: [id number] Arrival date & time: 03/25/20  0033   History Chief Complaint  Patient presents with   Cough    Laura Jacobs is a 86 y.o. female.  The history is provided by the patient and a relative.  Cough She has history of hypertension, hyperlipidemia, atrial fibrillation anticoagulated on apixaban, chronic kidney disease and is brought in by family because of concern of dehydration.  She has been coughing for the last 2 days.  Cough is productive of small amount of sputum but family was not able to see the color.  There has been no known fever or chills or sweats.  She had been eating well, but had not eaten or drank very much today.  There have been no known sick contacts and no known exposure to COVID-19.  Past Medical History:  Diagnosis Date   Acute MI [facility])    Atrial fibrillation, unspecified    CAD (coronary artery disease)    Gout    Hyperlipidemia    Hypertension     Patient Active Problem List   Diagnosis Date Noted   Acute lower UTI 02/10/2020   Aspiration pneumonia ([place]) 02/09/2020   Sepsis due to undetermined organism ([place]) 93/81/8299   Chronic systolic CHF (congestive heart failure) ([place]) 02/09/2020   Unspecified atrial fibrillation ([place]) 02/09/2020   Aspiration pneumonia of both lower lobes due to gastric secretions ([place]) 02/09/2020   Osteoporosis 02/27/2015   Low serum vitamin D 02/27/2015   Paroxysmal atrial fibrillation ([place])    Long term current use of anticoagulant therapy    Lung nodule 08/23/2014   CAD (coronary artery disease) 08/11/2013   Gout 05/10/2013   Hyperlipidemia 02/04/2013   Chronic kidney disease (CKD), stage IV (severe) ([place]) 02/04/2013   Hypertensive heart disease      Past Surgical History:  Procedure Laterality Date   heart by pass       OB History   No obstetric history on file.     Family History  Problem Relation Age of Onset    Heart disease Mother    Alcohol abuse Father    Alzheimer's disease Sister    Cancer Sister     Social History   Tobacco Use   Smoking status: Former Smoker    Packs/day: 0.50    Years: 60.00    Pack years: 30.00    Types: Cigarettes    Quit date: 08/07/2014    Years since quitting: 5.6   Smokeless tobacco: Former Systems developer    Quit date: 05/11/2014  Vaping Use   Vaping Use: Never used  Substance Use Topics   Alcohol use: No   Drug use: No    Home Medications Prior to Admission medications   Medication Sig Start Date End Date Taking? Authorizing Provider  albuterol (VENTOLIN HFA) 108 (90 Base) MCG/ACT inhaler Inhale 2 puffs into the lungs every 6 (six) hours as needed. 03/21/20   Loman Brooklyn, FNP  apixaban (ELIQUIS) 2.5 MG TABS tablet Take 1 tablet (2.5 mg total) by mouth 2 (two) times daily. 02/11/20   Orson Eva, MD  atorvastatin (LIPITOR) 40 MG tablet TAKE 1 TABLET ONCE DAILY IN THE EVENING Patient taking differently: Take 40 mg by mouth every evening.  09/21/19   Hassell Done, Mary-Margaret, FNP  calcium-vitamin D (OSCAL 500/200 D-3) 500-200 MG-UNIT per tablet Take 1 tablet by mouth 2 (two) times daily. 02/27/15   Cherre Robins, PharmD  citalopram (CELEXA) 20 MG tablet Take  1 tablet (20 mg total) by mouth daily. 11/15/19   Hassell Done, Mary-Margaret, FNP  ezetimibe (ZETIA) 10 MG tablet Take 1 tablet (10 mg total) by mouth daily. 09/21/19   Hassell Done, Mary-Margaret, FNP  furosemide (LASIX) 40 MG tablet Take 1 tablet (40 mg total) by mouth daily. 09/21/19   Hassell Done, Mary-Margaret, FNP  metoprolol tartrate (LOPRESSOR) 25 MG tablet TAKE  (1)  TABLET TWICE A DAY. Patient taking differently: Take 25 mg by mouth 2 (two) times daily.  09/21/19   Hassell Done, Mary-Margaret, FNP  mirtazapine (REMERON) 7.5 MG tablet Take 1 tablet (7.5 mg total) by mouth at bedtime. 03/21/20   Loman Brooklyn, FNP  NITROSTAT 0.4 MG SL tablet PLACE 1 TABLET UNDER THE TONGUE EVERY 5 MINUTES AS NEEDED FOR CHEST PA IN 11/09/15    Hassell Done, Mary-Margaret, FNP  potassium chloride SA (KLOR-CON M20) 20 MEQ tablet Take 1 tablet (20 mEq total) by mouth 2 (two) times daily. 09/21/19   [name], FNP    Allergies    Patient has no known allergies.  Review of Systems   Review of Systems  Respiratory: Positive for cough.   All other systems reviewed and are negative.   Physical Exam Updated Vital Signs BP 94/69    Pulse (!) 127    Temp 98.4 F (36.9 C) (Oral)    Resp (!) 22    Ht 5\' 4"  (1.626 m)    Wt 52.2 kg    SpO2 94%    BMI 19.74 kg/m   Physical Exam Vitals and nursing note reviewed.   86 year old female, somnolent but arousable, resting comfortably and in no acute distress. Vital signs are significant for rapid respiratory rate and rapid heart rate and borderline low blood pressure. Oxygen saturation is 94%, which is normal. Head is normocephalic and atraumatic. PERRLA, EOMI. Oropharynx is clear. Neck is nontender and supple without adenopathy or JVD. Back is nontender and there is no CVA tenderness. Lungs are clear without rales, wheezes, or rhonchi. Chest is nontender. Heart is tachycardic without murmur. Abdomen is soft, flat, nontender without masses or hepatosplenomegaly and peristalsis is normoactive. Extremities have no cyanosis or edema, full range of motion is present. Skin is warm and dry without rash. Neurologic: Somnolent but arousable, no gross motor or sensory deficits.  ED Results / Procedures / Treatments   Labs (all labs ordered are listed, but only abnormal results are displayed) Labs Reviewed  CBC WITH DIFFERENTIAL/PLATELET - Abnormal; Notable for the following components:      Result Value   WBC 23.9 (*)    RBC 3.82 (*)    Hemoglobin 11.4 (*)    HCT 35.9 (*)    Neutro Abs 20.5 (*)    Monocytes Absolute 1.9 (*)    Abs Immature Granulocytes 0.26 (*)    All other components within normal limits  COMPREHENSIVE METABOLIC PANEL - Abnormal; Notable for the following  components:   Sodium 132 (*)    Chloride 94 (*)    Glucose, Bld 126 (*)    BUN 32 (*)    Creatinine, Ser 1.87 (*)    Albumin 3.3 (*)    Total Bilirubin 1.8 (*)    GFR calc non Af Amer 24 (*)    GFR calc Af Amer 28 (*)    All other components within normal limits    EKG EKG Interpretation  Date/Time:  Saturday March 25 2020 00:55:51 EDT Ventricular Rate:  126 PR Interval:    QRS Duration: 89 QT  Interval:  302 QTC Calculation: 438 R Axis:   73 Text Interpretation: Atrial fibrillation with rapid ventricular response Low voltage, precordial leads Borderline repolarization abnormality Baseline wander in lead(s) V3 V6 When compared with ECG of 02/08/2020, No significant change was found Confirmed by Delora Fuel ([id number]) on 03/25/2020 1:03:03 AM   Radiology DG Chest Portable 1 View  Result Date: 03/25/2020 CLINICAL DATA:  Productive cough. EXAM: PORTABLE CHEST 1 VIEW COMPARISON:  Chest radiograph 03/02/2020, 02/09/2020. lung bases from abdominal CT 02/09/2020 FINDINGS: Post median sternotomy and CABG. Mild cardiomegaly with aortic atherosclerosis. Chronic hyperinflation. Worsening patchy bibasilar airspace disease. No pleural fluid or pneumothorax. No pulmonary edema. Stable osseous structures. IMPRESSION: 1. Worsening patchy bibasilar airspace disease, may represent pneumonia or aspiration. This appears similar to 02/09/2020 imaging, with interval clearing on intervening the 03/02/2020 chest radiograph. 2. Chronic hyperinflation. 3. Mild cardiomegaly.  Post CABG. Aortic Atherosclerosis (ICD10-I70.0). Electronically Signed   By: Keith Rake M.D.   On: 03/25/2020 01:39    Procedures Procedures  Medications Ordered in ED Medications  cefTRIAXone (ROCEPHIN) 2 g in sodium chloride 0.9 % 100 mL IVPB (has no administration in time range)  azithromycin (ZITHROMAX) 500 mg in sodium chloride 0.9 % 250 mL IVPB (has no administration in time range)  lactated ringers bolus 1,000 mL (has no  administration in time range)    ED Course  I have reviewed the triage vital signs and the nursing notes.  Pertinent labs & imaging results that were available during my care of the patient were reviewed by me and considered in my medical decision making (see chart for details).  MDM Rules/Calculators/A&P Cough concerning for recurrent pneumonia.  Old records are reviewed, and she had been admitted last month with aspiration pneumonia.  Chest x-ray is reported to look similar to her admitting x-ray, but there had been interval clearing so this is concerning for recurrent pneumonia.  WBC is markedly elevated.  Mild anemia is present which is unchanged from baseline.  Creatinine is not significantly changed from 5/13, but BUN has gone up significantly consistent with dehydration.  Bilirubin is also noted to be mildly elevated, not significantly changed from 5/6.  Will check blood cultures and lactic acid and start on antibiotics.  She is given a dose of ceftriaxone and azithromycin.  Case is discussed with Dr. Humphrey Rolls of [facility], who agrees to admit the patient.  Laura Jacobs was evaluated in Emergency Department on 03/25/2020 for the symptoms described in the history of present illness. She was evaluated in the context of the global COVID-19 pandemic, which necessitated consideration that the patient might be at risk for infection with the SARS-CoV-2 virus that causes COVID-19. Institutional protocols and algorithms that pertain to the evaluation of patients at risk for COVID-19 are in a state of rapid change based on information released by regulatory bodies including the CDC and federal and state organizations. These policies and algorithms were followed during the patient's care in the ED.  Final Clinical Impression(s) / ED Diagnoses Final diagnoses:  Community acquired pneumonia, unspecified laterality  Normochromic normocytic anemia  Renal insufficiency  Prerenal azotemia   Elevated bilirubin  Chronic atrial fibrillation (HCC)  Chronic anticoagulation    Rx / DC Orders ED Discharge Orders    None       Delora Fuel, MD 93/79/02 [id number]

## 2020-03-25 NOTE — H&P (Signed)
History and Physical    [name]:[id number] DOB: 01/19/1934 DOA: 03/25/2020  PCP: [name], FNP (Confirm with patient/family/NH records and if not entered, this has to be entered at [facility] point of entry) Patient coming from: Home  I have personally briefly reviewed patient's old medical records in [place]  Chief Complaint: Cough and dehydration  HPI: Laura Jacobs is a 86 y.o. female with medical history significant of hypertension, hyperlipidemia, atrial fibrillation on Eliquis, CKD and anxiety/depression is brought to ED for evaluation of severe episodic cough and dehydration.  Patient is brought to the hospital by her family because of concerns of dehydration due to poor oral intake for the last 2 days.  Patient states that he started having cough for the last 3 days.  Cough was dry earlier on but now become productive with a small amount of yellowish sputum.  Patient is also having poor appetite but denies fever, chills, chest pain, shortness of breath, nausea, vomiting, abdominal pain and urinary symptoms.    ED Course: Arrival to the ED patient had temperature of 98.9, blood pressure 133/83, heart rate 118, respiratory rate 18 and oxygen saturation 96% on room air.  Blood work showed leukocytosis with WBC count of 23.9, hemoglobin 11.4, sodium 132, potassium 4.0, BUN 32, creatinine 1.87, blood glucose 126 chest x-ray showed worsening patchy bibasilar airspace disease representing community-acquired pneumonia versus aspiration.  Patient was given IV ceftriaxone and azithromycin in the ED and also IV fluids because of elevated BUN and creatinine secondary to dehydration.  Review of Systems: As per HPI otherwise 10 point review of systems negative.  Unacceptable ROS statements: "10 systems reviewed," "Extensive" (without elaboration).  Acceptable ROS statements: "All others negative," "All others reviewed and are negative," and "All others  unremarkable," with at [place] documented Can't double dip - if using for HPI can't use for ROS  Past Medical History:  Diagnosis Date  . Acute MI ([place])   . Atrial fibrillation, unspecified   . CAD (coronary artery disease)   . Gout   . Hyperlipidemia   . Hypertension     Past Surgical History:  Procedure Laterality Date  . heart by pass       reports that she quit smoking about 5 years ago. Her smoking use included cigarettes. She has a 30.00 pack-year smoking history. She quit smokeless tobacco use about 5 years ago. She reports that she does not drink alcohol and does not use drugs.  No Known Allergies  Family History  Problem Relation Age of Onset  . Heart disease Mother   . Alcohol abuse Father   . Alzheimer's disease Sister   . Cancer Sister     Unacceptable: Noncontributory, unremarkable, or negative. Acceptable: (example)Family history negative for heart disease  Prior to Admission medications   Medication Sig Start Date End Date Taking? Authorizing Provider  albuterol (VENTOLIN HFA) 108 (90 Base) MCG/ACT inhaler Inhale 2 puffs into the lungs every 6 (six) hours as needed. 03/21/20   Loman Brooklyn, FNP  apixaban (ELIQUIS) 2.5 MG TABS tablet Take 1 tablet (2.5 mg total) by mouth 2 (two) times daily. 02/11/20   Orson Eva, MD  atorvastatin (LIPITOR) 40 MG tablet TAKE 1 TABLET ONCE DAILY IN THE EVENING Patient taking differently: Take 40 mg by mouth every evening.  09/21/19   Hassell Done, Mary-Margaret, FNP  calcium-vitamin D (OSCAL 500/200 D-3) 500-200 MG-UNIT per tablet Take 1 tablet by mouth 2 (two) times daily. 02/27/15  Cherre Robins, PharmD  citalopram (CELEXA) 20 MG tablet Take 1 tablet (20 mg total) by mouth daily. 11/15/19   Hassell Done, Mary-Margaret, FNP  ezetimibe (ZETIA) 10 MG tablet Take 1 tablet (10 mg total) by mouth daily. 09/21/19   Hassell Done, Mary-Margaret, FNP  furosemide (LASIX) 40 MG tablet Take 1 tablet (40 mg total) by mouth daily. 09/21/19   Hassell Done,  Mary-Margaret, FNP  metoprolol tartrate (LOPRESSOR) 25 MG tablet TAKE  (1)  TABLET TWICE A DAY. Patient taking differently: Take 25 mg by mouth 2 (two) times daily.  09/21/19   Hassell Done, Mary-Margaret, FNP  mirtazapine (REMERON) 7.5 MG tablet Take 1 tablet (7.5 mg total) by mouth at bedtime. 03/21/20   Loman Brooklyn, FNP  NITROSTAT 0.4 MG SL tablet PLACE 1 TABLET UNDER THE TONGUE EVERY 5 MINUTES AS NEEDED FOR CHEST PA IN 11/09/15   Hassell Done, Mary-Margaret, FNP  potassium chloride SA (KLOR-CON M20) 20 MEQ tablet Take 1 tablet (20 mEq total) by mouth 2 (two) times daily. 09/21/19   [name], FNP    Physical Exam: Vitals:   03/25/20 0056 03/25/20 0200  BP: 103/71 94/69  Pulse: (!) 126 (!) 127  Resp: 18 (!) 22  Temp: 98.4 F (36.9 C)   TempSrc: Oral   SpO2: 92% 94%  Weight: 52.2 kg   Height: 5\' 4"  (1.626 m)     Constitutional: NAD, calm, comfortable Vitals:   03/25/20 0056 03/25/20 0200  BP: 103/71 94/69  Pulse: (!) 126 (!) 127  Resp: 18 (!) 22  Temp: 98.4 F (36.9 C)   TempSrc: Oral   SpO2: 92% 94%  Weight: 52.2 kg   Height: 5\' 4"  (1.626 m)     General: 86 year old cachectic Caucasian female in no acute distress. Eyes: PERRL, lids and conjunctivae normal ENMT: Mucous membranes are moist. Posterior pharynx clear of any exudate or lesions.Normal dentition.  Neck: normal, supple, no masses, no thyromegaly Respiratory: Patient is saturating well on room air.  Diminished breath sounds in bilateral lung bases and mild rhonchi on bilateral lung bases but no wheezing or crackles on auscultation. No accessory muscle use.  Cardiovascular: Chest is nontender on palpation.  Irregularly irregular rhythm with normal heart rate.  No murmurs / rubs / gallops. No extremity edema. 2+ pedal pulses. No carotid bruits.  Abdomen: no tenderness, no masses palpated. No hepatosplenomegaly. Bowel sounds positive.  Musculoskeletal: no clubbing / cyanosis. No joint deformity upper and lower  extremities. Good ROM, no contractures. Normal muscle tone.  Skin: no rashes, lesions, ulcers. No induration Neurologic: CN 2-12 grossly intact. Sensation intact, DTR normal. Strength 5/5 in all 4.  Psychiatric: Normal judgment and insight. Alert and oriented x 3. Normal mood.   (Anything < 9 systems with 2 bullets each down codes to level 1) (If patient refuses exam can't bill higher level) (Make sure to document decubitus ulcers present on admission -- if possible -- and whether patient has chronic indwelling catheter at time of admission)  Labs on Admission: I have personally reviewed following labs and imaging studies  CBC: Recent Labs  Lab 03/25/20 0105  WBC 23.9*  NEUTROABS 20.5*  HGB 11.4*  HCT 35.9*  MCV 94.0  PLT 254   Basic Metabolic Panel: Recent Labs  Lab 03/25/20 0105  NA 132*  K 4.0  CL 94*  CO2 27  GLUCOSE 126*  BUN 32*  CREATININE 1.87*  CALCIUM 9.0   GFR: Estimated Creatinine Clearance: 18.8 mL/min (A) (by C-G formula based on SCr of 1.87 mg/dL (  H)). Liver Function Tests: Recent Labs  Lab 03/25/20 0105  AST 24  ALT 18  ALKPHOS 88  BILITOT 1.8*  PROT 7.8  ALBUMIN 3.3*   No results for input(s): LIPASE, AMYLASE in the last 168 hours. No results for input(s): AMMONIA in the last 168 hours. Coagulation Profile: No results for input(s): INR, PROTIME in the last 168 hours. Cardiac Enzymes: No results for input(s): CKTOTAL, CKMB, CKMBINDEX, TROPONINI in the last 168 hours. BNP (last 3 results) No results for input(s): PROBNP in the last 8760 hours. HbA1C: No results for input(s): HGBA1C in the last 72 hours. CBG: No results for input(s): GLUCAP in the last 168 hours. Lipid Profile: No results for input(s): CHOL, HDL, LDLCALC, TRIG, CHOLHDL, LDLDIRECT in the last 72 hours. Thyroid Function Tests: No results for input(s): TSH, T4TOTAL, FREET4, T3FREE, THYROIDAB in the last 72 hours. Anemia Panel: No results for input(s): VITAMINB12, FOLATE,  FERRITIN, TIBC, IRON, RETICCTPCT in the last 72 hours. Urine analysis:    Component Value Date/Time   COLORURINE YELLOW 02/09/2020 1530   APPEARANCEUR HAZY (A) 02/09/2020 1530   LABSPEC 1.013 02/09/2020 1530   PHURINE 5.0 02/09/2020 1530   GLUCOSEU NEGATIVE 02/09/2020 1530   HGBUR MODERATE (A) 02/09/2020 [place] 02/09/2020 1530   BILIRUBINUR NEG 01/05/2013 1106   [place] 02/09/2020 1530   PROTEINUR NEGATIVE 02/09/2020 1530   UROBILINOGEN 1.0 08/23/2014 0001   NITRITE NEGATIVE 02/09/2020 1530   LEUKOCYTESUR LARGE (A) 02/09/2020 1530    Radiological Exams on Admission: DG Chest Portable 1 View  Result Date: 03/25/2020 CLINICAL DATA:  Productive cough. EXAM: PORTABLE CHEST 1 VIEW COMPARISON:  Chest radiograph 03/02/2020, 02/09/2020. lung bases from abdominal CT 02/09/2020 FINDINGS: Post median sternotomy and CABG. Mild cardiomegaly with aortic atherosclerosis. Chronic hyperinflation. Worsening patchy bibasilar airspace disease. No pleural fluid or pneumothorax. No pulmonary edema. Stable osseous structures. IMPRESSION: 1. Worsening patchy bibasilar airspace disease, may represent pneumonia or aspiration. This appears similar to 02/09/2020 imaging, with interval clearing on intervening the 03/02/2020 chest radiograph. 2. Chronic hyperinflation. 3. Mild cardiomegaly.  Post CABG. Aortic Atherosclerosis (ICD10-I70.0). Electronically Signed   By: Keith Rake M.D.   On: 03/25/2020 01:39    EKG: Independently reviewed.  EKG showed atrial fibrillation with RVR at a rate of 126 bpm.  No acute ischemic changes.  Assessment/Plan Principal Problem:   Pneumonia Chest x-ray is positive for bilateral basilar airspace disease representing community-acquired pneumonia versus aspiration.  Patient started on IV ceftriaxone and azithromycin in the ED.  Continue IV ceftriaxone 1 g daily and azithromycin p.o. 500 mg daily.  Blood cultures ordered.  Oxygen supplementation with  nasal cannula as needed.  DuoNeb every 4 hours as needed for shortness of breath and wheezing.  Active Problems:  Dehydration Patient is having BUN of 32 while creatinine is also elevated than the baseline.  Mucous membranes look dry.  In the ED, patient was started on bolus of lactated R[place].  Continue to monitor BUN and creatinine level.  Continue gentle hydration with IV normal saline at the rate of 75 mL/h for 1 L.  Paroxysmal atrial fibrillation Patient was in atrial fibrillation with RVR on arrival to the hospital but rapid ventricular rate resolved after IV fluids and the most recent heart rate is 80 bpm.  Patient will be started on home medications for her atrial fibrillation after confirmation by the pharmacy and also started on home Eliquis 2.5 mg twice daily.  Patient is also on continuous telemetry monitoring.  Hyperlipidemia Patient will be restarted on home cholesterol medication after confirmation by the pharmacy.    Hypertensive heart disease  Patient has a history of hypertension but the blood pressure is soft at this time.  Home blood pressure medications will be restarted once the blood pressure becomes stable and after home medication confirmation by the pharmacy       DVT prophylaxis: Eliquis 2.5 mg daily Code Status: Full code Family Communication: Patient brought to the hospital by family but no family member present at the bedside at this time. Disposition Plan:  Consults called: None Admission status: Observation/telemetry   Edmonia Lynch MD [facility] Pager [phone]   If 7PM-7AM, please contact night-coverage [email or web address] Password   03/25/2020, 4:08 AM

## 2020-03-25 NOTE — Progress Notes (Signed)
  Patient seen and evaluated, chart reviewed, please see EMR for updated orders. Please see full H&P dictated by admitting physician dr. Humphrey Rolls for same date of service.   Brief Summary 86 y.o. female with medical history significant of hypertension, hyperlipidemia, atrial fibrillation on Eliquis, CKD and anxiety/depression admitted with presumed aspiration pneumonia    A/p 1) presumed aspiration pneumonia--- continue Rocephin and azithromycin, get speech eval -WBC is 23.9,    2)CKD stage - IV---   Creatinine is 1.8 which is close to baseline --renally adjust medications, avoid nephrotoxic agents / dehydration  / hypotension  3)PAFib--continue Eliquis 2.5 mg twice daily for stroke prophylaxis -Continue metoprolol 25 mg p.o. twice daily for rate control  4) chronic normocytic and normochromic anemia--- hemoglobin currently 11.4 which is around patient's baseline  5) dementia/depression--baseline cognitive and memory deficits, continue Celexa and Remeron   6) social/ethics--plan of care and advanced directives discussed with patient son at bedside, patient is a full code   Patient seen and evaluated, chart reviewed, please see EMR for updated orders. Please see full H&P dictated by admitting physician dr. Humphrey Rolls for same date of service.   Roxan Hockey, MD

## 2020-03-25 NOTE — ED Triage Notes (Signed)
Pt was brought in by her Son who reports he feels pt might be dehydrated.  Pt denies complaints.  Pt states she is not sure why she is here other than her cough.

## 2020-03-25 NOTE — ED Notes (Signed)
Radiology at bedside

## 2020-03-25 NOTE — ED Notes (Signed)
ED Provider at bedside. 

## 2020-03-26 DIAGNOSIS — F329 Major depressive disorder, single episode, unspecified: Secondary | ICD-10-CM | POA: Diagnosis present

## 2020-03-26 DIAGNOSIS — Z951 Presence of aortocoronary bypass graft: Secondary | ICD-10-CM | POA: Diagnosis not present

## 2020-03-26 DIAGNOSIS — E785 Hyperlipidemia, unspecified: Secondary | ICD-10-CM | POA: Diagnosis present

## 2020-03-26 DIAGNOSIS — D649 Anemia, unspecified: Secondary | ICD-10-CM | POA: Diagnosis present

## 2020-03-26 DIAGNOSIS — Z20822 Contact with and (suspected) exposure to covid-19: Secondary | ICD-10-CM | POA: Diagnosis present

## 2020-03-26 DIAGNOSIS — I2510 Atherosclerotic heart disease of native coronary artery without angina pectoris: Secondary | ICD-10-CM | POA: Diagnosis not present

## 2020-03-26 DIAGNOSIS — Z8701 Personal history of pneumonia (recurrent): Secondary | ICD-10-CM | POA: Diagnosis not present

## 2020-03-26 DIAGNOSIS — Z7901 Long term (current) use of anticoagulants: Secondary | ICD-10-CM | POA: Diagnosis not present

## 2020-03-26 DIAGNOSIS — J440 Chronic obstructive pulmonary disease with acute lower respiratory infection: Secondary | ICD-10-CM | POA: Diagnosis present

## 2020-03-26 DIAGNOSIS — J189 Pneumonia, unspecified organism: Secondary | ICD-10-CM | POA: Diagnosis present

## 2020-03-26 DIAGNOSIS — I5032 Chronic diastolic (congestive) heart failure: Secondary | ICD-10-CM | POA: Diagnosis present

## 2020-03-26 DIAGNOSIS — E860 Dehydration: Secondary | ICD-10-CM

## 2020-03-26 DIAGNOSIS — I130 Hypertensive heart and chronic kidney disease with heart failure and stage 1 through stage 4 chronic kidney disease, or unspecified chronic kidney disease: Secondary | ICD-10-CM | POA: Diagnosis not present

## 2020-03-26 DIAGNOSIS — N184 Chronic kidney disease, stage 4 (severe): Secondary | ICD-10-CM | POA: Diagnosis not present

## 2020-03-26 DIAGNOSIS — I480 Paroxysmal atrial fibrillation: Secondary | ICD-10-CM | POA: Diagnosis not present

## 2020-03-26 DIAGNOSIS — Z79899 Other long term (current) drug therapy: Secondary | ICD-10-CM | POA: Diagnosis not present

## 2020-03-26 DIAGNOSIS — J690 Pneumonitis due to inhalation of food and vomit: Secondary | ICD-10-CM | POA: Diagnosis not present

## 2020-03-26 DIAGNOSIS — R0602 Shortness of breath: Secondary | ICD-10-CM | POA: Diagnosis not present

## 2020-03-26 DIAGNOSIS — R64 Cachexia: Secondary | ICD-10-CM | POA: Diagnosis present

## 2020-03-26 DIAGNOSIS — I119 Hypertensive heart disease without heart failure: Secondary | ICD-10-CM | POA: Diagnosis not present

## 2020-03-26 DIAGNOSIS — I482 Chronic atrial fibrillation, unspecified: Secondary | ICD-10-CM | POA: Diagnosis present

## 2020-03-26 DIAGNOSIS — Z87891 Personal history of nicotine dependence: Secondary | ICD-10-CM | POA: Diagnosis not present

## 2020-03-26 DIAGNOSIS — I5022 Chronic systolic (congestive) heart failure: Secondary | ICD-10-CM | POA: Diagnosis not present

## 2020-03-26 DIAGNOSIS — F0390 Unspecified dementia without behavioral disturbance: Secondary | ICD-10-CM | POA: Diagnosis present

## 2020-03-26 DIAGNOSIS — Z681 Body mass index (BMI) 19 or less, adult: Secondary | ICD-10-CM | POA: Diagnosis not present

## 2020-03-26 MED ORDER — ALBUTEROL SULFATE (2.5 MG/3ML) 0.083% IN NEBU: 2.5000 mg | INHALATION_SOLUTION | RESPIRATORY_TRACT | Status: DC | PRN

## 2020-03-26 MED ORDER — GUAIFENESIN ER 600 MG PO TB12
600.0000 mg | ORAL_TABLET | Freq: Two times a day (BID) | ORAL | Status: DC
  Administered 2020-03-26 – 2020-03-27 (×3): 600 mg via ORAL
  Filled 2020-03-26 (×3): qty 1

## 2020-03-26 MED ORDER — IPRATROPIUM-ALBUTEROL 0.5-2.5 (3) MG/3ML IN SOLN
3.0000 mL | Freq: Three times a day (TID) | RESPIRATORY_TRACT | Status: DC
  Administered 2020-03-26: 3 mL via RESPIRATORY_TRACT
  Filled 2020-03-26: qty 3

## 2020-03-26 MED ORDER — IPRATROPIUM-ALBUTEROL 0.5-2.5 (3) MG/3ML IN SOLN
3.0000 mL | Freq: Two times a day (BID) | RESPIRATORY_TRACT | Status: DC
  Filled 2020-03-26: qty 3

## 2020-03-26 MED ORDER — METOPROLOL TARTRATE 25 MG PO TABS
12.5000 mg | ORAL_TABLET | Freq: Two times a day (BID) | ORAL | Status: DC
  Administered 2020-03-26 – 2020-03-27 (×3): 12.5 mg via ORAL
  Filled 2020-03-26 (×2): qty 1

## 2020-03-26 NOTE — Evaluation (Signed)
Clinical/Bedside Swallow Evaluation Patient Details  Name: Laura Jacobs MRN: [id number] Date of Birth: 06-Mar-1934  Today's Date: 03/26/2020 Time: SLP Start Time (ACUTE ONLY): 1000 SLP Stop Time (ACUTE ONLY): 1022 SLP Time Calculation (min) (ACUTE ONLY): 22 min  Past Medical History:  Past Medical History:  Diagnosis Date  . Acute MI ([place])   . Atrial fibrillation, unspecified   . CAD (coronary artery disease)   . Gout   . Hyperlipidemia   . Hypertension    Past Surgical History:  Past Surgical History:  Procedure Laterality Date  . heart by pass     HPI:  86 y.o. female with medical history significant of hypertension, hyperlipidemia, atrial fibrillation on Eliquis, CKD and anxiety/depression admitted with presumed aspiration pneumonia. BSE requested. Pt had BSE in May during admission as well.    Assessment / Plan / Recommendation Clinical Impression  Clinical swallow evaluation completed at bedside. Pt was seen with her breakfast tray. She denies difficulty swallowing. Oral motor examination is WNL, pt with U/L dentures. Pt consumed self presentations of straw and cup sips water, applesauce, and peanut butter crackers. Pt without overt signs or symptoms of aspiration. Can complete MBSS if MD desires, however cannot be completed until Monday afternoon. Pt and family deny difficulty swallowing at home. Can also complete MBSS as an outpatient if occult aspiration is suspected. Above to RN and MD.   SLP Visit Diagnosis: Dysphagia, unspecified (R13.10)    Aspiration Risk  No limitations    Diet Recommendation Regular;Thin liquid   Liquid Administration via: Cup;Straw Medication Administration: Whole meds with liquid Supervision: Patient able to self feed Postural Changes: Seated upright at 90 degrees;Remain upright for at least 30 minutes after po intake    Other  Recommendations Oral Care Recommendations: Oral care BID Other Recommendations: Clarify dietary  restrictions   Follow up Recommendations None      Frequency and Duration min 1 x/week  1 week       Prognosis Prognosis for Safe Diet Advancement: Good      Swallow Study   General Date of Onset: 03/25/20 HPI: 86 y.o. female with medical history significant of hypertension, hyperlipidemia, atrial fibrillation on Eliquis, CKD and anxiety/depression admitted with presumed aspiration pneumonia. BSE requested. Pt had BSE in May during admission as well.  Type of Study: Bedside Swallow Evaluation Previous Swallow Assessment: BSE 02/09/20 D3/thin Diet Prior to this Study: Regular;Thin liquids Temperature Spikes Noted: No Respiratory Status: Room air History of Recent Intubation: No Behavior/Cognition: Alert;Cooperative;Pleasant mood Oral Cavity Assessment: Within Functional Limits Oral Care Completed by SLP: No Oral Cavity - Dentition: Dentures, top;Dentures, bottom Vision: Functional for self-feeding Self-Feeding Abilities: Able to feed self Patient Positioning: Upright in bed Baseline Vocal Quality: Normal Volitional Cough: Strong;Congested Volitional Swallow: Able to elicit    Oral/Motor/Sensory Function Overall Oral Motor/Sensory Function: Within functional limits   Ice Chips Ice chips: Within functional limits Presentation: Spoon   Thin Liquid Thin Liquid: Within functional limits Presentation: Cup;Self Fed;Straw    Nectar Thick Nectar Thick Liquid: Not tested   Honey Thick Honey Thick Liquid: Not tested   Puree Puree: Within functional limits Presentation: Spoon;Self Fed   Solid     Solid: Within functional limits Presentation: Self Fed     Thank you,  Genene Churn, [place]  Danie Hannig 03/26/2020,10:22 AM

## 2020-03-26 NOTE — Progress Notes (Signed)
Patient Demographics:    Laura Jacobs, is a 86 y.o. female, DOB - May 14, 1934, XNT:[id number]  Admit date - 03/25/2020   Admitting Physician Edmonia Lynch, DO  Outpatient Primary MD for the patient is [name], [place]  LOS - 0   Chief Complaint  Patient presents with  . Cough        Subjective:    Laura Jacobs today has no fevers, no emesis,  No chest pain,   ---recurrent pneumonia, persistent respiratory symptoms requiring IV antibiotics,  cough persist, -Dyspnea on exertion worse   Assessment  & Plan :    Principal Problem:   Pneumonia Active Problems:   Hyperlipidemia   Hypertensive heart disease    Paroxysmal atrial fibrillation (HCC)   Dehydration   Brief Summary 86 y.o.femalewith medical history significant ofHTN, HLD, chronic atrial fibrillation on Eliquis, CKD IV , HFpEF and anxiety/depression admitted CAP----chest x-rayon admission showed worsening patchy bibasilar airspace disease representing community-acquired pneumonia Versus aspiration. -Please note that patient was also admitted and treated for sepsis secondary to pneumonia from 02/09/2020 thru 02/11/2020   A/p 1)Recurrent CAP-- ?? aspiration related ---  recent inpatient treatment for pneumonia , please see above  -WBC on admission 23.9,  -continue iv Rocephin and azithromycin,   -Dyspnea and cough persist -Appears able to wean off oxygen -Dyspnea on exertion worse -Speech pathologist eval appreciated recommends regular diet -Mucolytics and bronchodilators as ordered -Repeat CBC and chest x-ray on 03/27/2020 -We will ambulate again prior to discharge to see if needs home O2  2)CKD stage - IV---   Creatinine is 1.8 which is close to baseline --renally adjust medications, avoid nephrotoxic agents / dehydration  / hypotension  3)PAFib--continue Eliquis 2.5 mg twice daily for stroke  prophylaxis -EF 55% based on echo from May 2021 -Continue metoprolol 25 mg p.o. twice daily for rate control  4) chronic normocytic and normochromic anemia--- hemoglobin currently 11.4 which is around patient's baseline -Repeat CBC in a.m.  5) dementia/depression--baseline cognitive and memory deficits, continue Celexa and Remeron  6) social/ethics--plan of care and advanced directives discussed with patient son at bedside, patient is a full code  7)COPD--she has a reformed smoker, appears to be able to wean off oxygen, continue bronchodilators and mucolytics,  8)HFpEF--- clinically suspect some degree of chronic diastolic dysfunction CHF without you flareup at this time actually appears dry on admission, no evidence of volume overload, -Continue metoprolol  Disposition/Need for in-Hospital Stay- patient unable to be discharged at this time due to --recurrent pneumonia, persistent respiratory symptoms requiring IV antibiotics,  cough persist, -Dyspnea on exertion worse   Status is: Inpatient  Remains inpatient appropriate because:--recurrent pneumonia, persistent respiratory symptoms requiring IV antibiotics,  cough persist, -Dyspnea on exertion worse   Disposition: The patient is from: Home              Anticipated d/c is to: Home              Anticipated d/c date is: 1 day              Patient currently is not medically stable to d/c. Barriers: Not Clinically Stable- ---recurrent pneumonia, persistent respiratory symptoms requiring IV antibiotics,  cough persist, -Dyspnea on exertion worse  Code Status :  full  Family Communication--(Discussed with son   Consults  : Electrical engineer  DVT Prophylaxis  : Eliquis- SCDs  Lab Results  Component Value Date   PLT 327 03/25/2020    Inpatient Medications  Scheduled Meds: . apixaban  2.5 mg Oral BID  . atorvastatin  40 mg Oral q1800  . azithromycin  500 mg Oral QHS  . citalopram  20 mg Oral Daily  . metoprolol tartrate   12.5 mg Oral BID  . mirtazapine  7.5 mg Oral QHS   Continuous Infusions: . sodium chloride 40 mL/hr at 03/25/20 1712  . cefTRIAXone (ROCEPHIN)  IV 1 g (03/25/20 2122)   PRN Meds:.ipratropium-albuterol    Anti-infectives (From admission, onward)   Start     Dose/Rate Route Frequency Ordered Stop   03/25/20 2200  cefTRIAXone (ROCEPHIN) 1 g in sodium chloride 0.9 % 100 mL IVPB     Discontinue    Note to Pharmacy: Patient already got the first dose of ceftriaxone in the ED.  Pharmacy to adjust the timing for next dose   1 g 200 mL/hr over 30 Minutes Intravenous Every 24 hours 03/25/20 0352     03/25/20 2200  azithromycin (ZITHROMAX) tablet 500 mg     Discontinue    Note to Pharmacy: Patient already got the first dose of ceftriaxone in the ED.  Pharmacy to adjust the timing for next dose   500 mg Oral Daily at bedtime 03/25/20 0352     03/25/20 0315  cefTRIAXone (ROCEPHIN) 2 g in sodium chloride 0.9 % 100 mL IVPB        2 g 200 mL/hr over 30 Minutes Intravenous  Once 03/25/20 0302 03/25/20 0404   03/25/20 0315  azithromycin (ZITHROMAX) 500 mg in sodium chloride 0.9 % 250 mL IVPB        500 mg 250 mL/hr over 60 Minutes Intravenous  Once 03/25/20 0302 03/25/20 0433        Objective:   Vitals:   03/25/20 1700 03/25/20 2017 03/26/20 0616 03/26/20 0926  BP: 96/60 (!) 101/59 97/60 102/61  Pulse: 90 (!) 48 81   Resp: 20 16 16    Temp: 98.6 F (37 C) 98.2 F (36.8 C) 97.6 F (36.4 C)   TempSrc:  Oral Axillary   SpO2: 96% 91% 97%   Weight:      Height:        Wt Readings from Last 3 Encounters:  03/25/20 51 kg  02/17/20 52.2 kg  02/09/20 53.1 kg     Intake/Output Summary (Last 24 hours) at 03/26/2020 1153 Last data filed at 03/26/2020 0918 Gross per 24 hour  Intake 1885.83 ml  Output 400 ml  Net 1485.83 ml     Physical Exam  Gen:- Awake Alert,  In no apparent distress  HEENT:- [place].AT, No sclera icterus Neck-Supple Neck,No JVD,.  Lungs-diminished in bases, few  scattered rhonchi but no wheezing CV- S1, S2 normal, regular, prior sternotomy scar Abd-  +ve B.Sounds, Abd Soft, No tenderness,    Extremity/Skin:- No  edema, pedal pulses present Psych-affect is appropriate, oriented x3 Neuro-no new focal deficits, no tremors   Data Review:   Micro Results Recent Results (from the past 240 hour(s))  Culture, blood (routine x 2)     Status: None (Preliminary result)   Collection Time: 03/25/20  3:10 AM   Specimen: BLOOD RIGHT FOREARM  Result Value Ref Range Status   Specimen Description BLOOD RIGHT FOREARM  Final   Special Requests   Final  BOTTLES DRAWN AEROBIC AND ANAEROBIC Blood Culture adequate volume   Culture   Final    NO GROWTH < 12 HOURS Performed at [facility], [address], [place], [place] [address]    Report Status PENDING  Incomplete  SARS Coronavirus 2 by RT PCR (hospital order, performed in [facility] hospital lab) Nasopharyngeal Nasopharyngeal Swab     Status: None   Collection Time: 03/25/20  3:10 AM   Specimen: Nasopharyngeal Swab  Result Value Ref Range Status   SARS Coronavirus 2 NEGATIVE NEGATIVE Final    Comment: (NOTE) SARS-CoV-2 target nucleic acids are NOT DETECTED.  The SARS-CoV-2 RNA is generally detectable in upper and lower respiratory specimens during the acute phase of infection. The lowest concentration of SARS-CoV-2 viral copies this assay can detect is 250 copies / mL. A negative result does not preclude SARS-CoV-2 infection and should not be used as the sole basis for treatment or other patient management decisions.  A negative result may occur with improper specimen collection / handling, submission of specimen other than nasopharyngeal swab, presence of viral mutation(s) within the areas targeted by this assay, and inadequate number of viral copies (<250 copies / mL). A negative result must be combined with clinical observations, patient history, and epidemiological information.  Fact Sheet for  Patients:   StrictlyIdeas.no  Fact Sheet for Healthcare Providers: BankingDealers.co.za  This test is not yet approved or  cleared by the [place] FDA and has been authorized for detection and/or diagnosis of SARS-CoV-2 by FDA under an Emergency Use Authorization (EUA).  This EUA will remain in effect (meaning this test can be used) for the duration of the COVID-19 declaration under Section 564(b)(1) of the Act, 21 U.S.C. section 360bbb-3(b)(1), unless the authorization is terminated or revoked sooner.  Performed at [facility], [address], [place], [place] [address]   Culture, blood (routine x 2)     Status: None (Preliminary result)   Collection Time: 03/25/20  3:15 AM   Specimen: Right Antecubital; Blood  Result Value Ref Range Status   Specimen Description RIGHT ANTECUBITAL  Final   Special Requests   Final    BOTTLES DRAWN AEROBIC AND ANAEROBIC Blood Culture adequate volume   Culture   Final    NO GROWTH < 12 HOURS Performed at [facility], [address], [place], [place] [address]    Report Status PENDING  Incomplete    Radiology Reports DG Chest Portable 1 View  Result Date: 03/25/2020 CLINICAL DATA:  Productive cough. EXAM: PORTABLE CHEST 1 VIEW COMPARISON:  Chest radiograph 03/02/2020, 02/09/2020. lung bases from abdominal CT 02/09/2020 FINDINGS: Post median sternotomy and CABG. Mild cardiomegaly with aortic atherosclerosis. Chronic hyperinflation. Worsening patchy bibasilar airspace disease. No pleural fluid or pneumothorax. No pulmonary edema. Stable osseous structures. IMPRESSION: 1. Worsening patchy bibasilar airspace disease, may represent pneumonia or aspiration. This appears similar to 02/09/2020 imaging, with interval clearing on intervening the 03/02/2020 chest radiograph. 2. Chronic hyperinflation. 3. Mild cardiomegaly.  Post CABG. Aortic Atherosclerosis (ICD10-I70.0). Electronically Signed   By: Keith Rake M.D.   On: 03/25/2020 01:39     CBC Recent Labs  Lab 03/25/20 0105  WBC 23.9*  HGB 11.4*  HCT 35.9*  PLT 327  MCV 94.0  MCH 29.8  MCHC 31.8  RDW 14.6  LYMPHSABS 1.1  MONOABS 1.9*  EOSABS 0.1  BASOSABS 0.1    Chemistries  Recent Labs  Lab 03/25/20 0105  NA 132*  K 4.0  CL 94*  CO2  27  GLUCOSE 126*  BUN 32*  CREATININE 1.87*  CALCIUM 9.0  AST 24  ALT 18  ALKPHOS 88  BILITOT 1.8*   ------------------------------------------------------------------------------------------------------------------ No results for input(s): CHOL, HDL, LDLCALC, TRIG, CHOLHDL, LDLDIRECT in the last 72 hours.  No results found for: HGBA1C ------------------------------------------------------------------------------------------------------------------ No results for input(s): TSH, T4TOTAL, T3FREE, THYROIDAB in the last 72 hours.  Invalid input(s): FREET3 ------------------------------------------------------------------------------------------------------------------ No results for input(s): VITAMINB12, FOLATE, FERRITIN, TIBC, IRON, RETICCTPCT in the last 72 hours.  Coagulation profile No results for input(s): INR, PROTIME in the last 168 hours.  No results for input(s): DDIMER in the last 72 hours.  Cardiac Enzymes No results for input(s): CKMB, TROPONINI, MYOGLOBIN in the last 168 hours.  Invalid input(s): CK ------------------------------------------------------------------------------------------------------------------    Component Value Date/Time   BNP 713.8 (H) 11/29/2016 1445     Roxan Hockey M.D on 03/26/2020 at 11:53 AM  Go to [email or web address] - for contact info  [facility] - Office  [phone]

## 2020-03-27 ENCOUNTER — Inpatient Hospital Stay (HOSPITAL_COMMUNITY): Payer: Medicare Other

## 2020-03-27 ENCOUNTER — Ambulatory Visit (INDEPENDENT_AMBULATORY_CARE_PROVIDER_SITE_OTHER): Payer: Medicare Other

## 2020-03-27 ENCOUNTER — Telehealth: Payer: Self-pay | Admitting: *Deleted

## 2020-03-27 DIAGNOSIS — I2510 Atherosclerotic heart disease of native coronary artery without angina pectoris: Secondary | ICD-10-CM

## 2020-03-27 DIAGNOSIS — I130 Hypertensive heart and chronic kidney disease with heart failure and stage 1 through stage 4 chronic kidney disease, or unspecified chronic kidney disease: Secondary | ICD-10-CM

## 2020-03-27 DIAGNOSIS — E785 Hyperlipidemia, unspecified: Secondary | ICD-10-CM

## 2020-03-27 DIAGNOSIS — Z7901 Long term (current) use of anticoagulants: Secondary | ICD-10-CM

## 2020-03-27 DIAGNOSIS — I480 Paroxysmal atrial fibrillation: Secondary | ICD-10-CM | POA: Diagnosis not present

## 2020-03-27 DIAGNOSIS — Z8701 Personal history of pneumonia (recurrent): Secondary | ICD-10-CM

## 2020-03-27 DIAGNOSIS — E559 Vitamin D deficiency, unspecified: Secondary | ICD-10-CM

## 2020-03-27 DIAGNOSIS — Z87440 Personal history of urinary (tract) infections: Secondary | ICD-10-CM

## 2020-03-27 DIAGNOSIS — M810 Age-related osteoporosis without current pathological fracture: Secondary | ICD-10-CM

## 2020-03-27 DIAGNOSIS — Z87891 Personal history of nicotine dependence: Secondary | ICD-10-CM

## 2020-03-27 DIAGNOSIS — N184 Chronic kidney disease, stage 4 (severe): Secondary | ICD-10-CM | POA: Diagnosis not present

## 2020-03-27 DIAGNOSIS — I5022 Chronic systolic (congestive) heart failure: Secondary | ICD-10-CM | POA: Diagnosis not present

## 2020-03-27 DIAGNOSIS — Z9181 History of falling: Secondary | ICD-10-CM

## 2020-03-27 DIAGNOSIS — M6281 Muscle weakness (generalized): Secondary | ICD-10-CM

## 2020-03-27 LAB — CBC
HCT: 28.6 % — ABNORMAL LOW (ref 36.0–46.0)
Hemoglobin: 8.9 g/dL — ABNORMAL LOW (ref 12.0–15.0)
MCH: 29.8 pg (ref 26.0–34.0)
MCHC: 31.1 g/dL (ref 30.0–36.0)
MCV: 95.7 fL (ref 80.0–100.0)
Platelets: 213 10*3/uL (ref 150–400)
RBC: 2.99 MIL/uL — ABNORMAL LOW (ref 3.87–5.11)
RDW: 14.9 % (ref 11.5–15.5)
WBC: 9.1 10*3/uL (ref 4.0–10.5)
nRBC: 0 % (ref 0.0–0.2)

## 2020-03-27 LAB — BASIC METABOLIC PANEL
Anion gap: 9 (ref 5–15)
BUN: 24 mg/dL — ABNORMAL HIGH (ref 8–23)
CO2: 26 mmol/L (ref 22–32)
Calcium: 8.2 mg/dL — ABNORMAL LOW (ref 8.9–10.3)
Chloride: 105 mmol/L (ref 98–111)
Creatinine, Ser: 1.37 mg/dL — ABNORMAL HIGH (ref 0.44–1.00)
GFR calc Af Amer: 41 mL/min — ABNORMAL LOW (ref >60)
GFR calc non Af Amer: 36 mL/min — ABNORMAL LOW (ref >60)
Glucose, Bld: 77 mg/dL (ref 70–99)
Potassium: 3.2 mmol/L — ABNORMAL LOW (ref 3.5–5.1)
Sodium: 140 mmol/L (ref 135–145)

## 2020-03-27 MED ORDER — CITALOPRAM HYDROBROMIDE 20 MG PO TABS: 20.0000 mg | ORAL_TABLET | Freq: Every day | ORAL | 5 refills | Status: DC

## 2020-03-27 MED ORDER — ALBUTEROL SULFATE HFA 108 (90 BASE) MCG/ACT IN AERS: 2.0000 | INHALATION_SPRAY | Freq: Four times a day (QID) | RESPIRATORY_TRACT | 2 refills | Status: DC | PRN

## 2020-03-27 MED ORDER — COMPRESSOR/NEBULIZER MISC
1.0000 [IU] | Freq: Two times a day (BID) | 0 refills | Status: DC | PRN
Start: 2020-03-27 — End: 2020-03-27

## 2020-03-27 MED ORDER — IPRATROPIUM-ALBUTEROL 0.5-2.5 (3) MG/3ML IN SOLN: 3.0000 mL | Freq: Two times a day (BID) | RESPIRATORY_TRACT | 2 refills | Status: DC

## 2020-03-27 MED ORDER — APIXABAN 2.5 MG PO TABS: 2.5000 mg | ORAL_TABLET | Freq: Two times a day (BID) | ORAL | 1 refills | Status: DC

## 2020-03-27 MED ORDER — FLUTICASONE-SALMETEROL 250-50 MCG/DOSE IN AEPB: 1.0000 | INHALATION_SPRAY | Freq: Two times a day (BID) | RESPIRATORY_TRACT | 3 refills | Status: DC

## 2020-03-27 MED ORDER — COMPRESSOR/NEBULIZER MISC
1.0000 [IU] | Freq: Two times a day (BID) | 0 refills | Status: AC | PRN
Start: 2020-03-27 — End: ?

## 2020-03-27 MED ORDER — GUAIFENESIN ER 600 MG PO TB12: 600.0000 mg | ORAL_TABLET | Freq: Two times a day (BID) | ORAL | 0 refills | Status: DC

## 2020-03-27 MED ORDER — METOPROLOL TARTRATE 25 MG PO TABS: 12.5000 mg | ORAL_TABLET | Freq: Two times a day (BID) | ORAL | 2 refills | Status: DC

## 2020-03-27 MED ORDER — DM-GUAIFENESIN ER 30-600 MG PO TB12: 1.0000 | ORAL_TABLET | Freq: Two times a day (BID) | ORAL | 3 refills | Status: DC | PRN

## 2020-03-27 MED ORDER — AMOXICILLIN-POT CLAVULANATE 875-125 MG PO TABS
1.0000 | ORAL_TABLET | Freq: Two times a day (BID) | ORAL | 0 refills | Status: AC
Start: 2020-03-27 — End: 2020-04-03

## 2020-03-27 MED ORDER — ATORVASTATIN CALCIUM 40 MG PO TABS: 40.0000 mg | ORAL_TABLET | Freq: Every evening | ORAL | 3 refills | Status: DC

## 2020-03-27 MED ORDER — AZITHROMYCIN 250 MG PO TABS
500.0000 mg | ORAL_TABLET | Freq: Once | ORAL | Status: AC
  Administered 2020-03-27: 500 mg via ORAL
  Filled 2020-03-27: qty 2

## 2020-03-27 MED ORDER — SODIUM CHLORIDE 0.9 % IV SOLN
2.0000 g | Freq: Once | INTRAVENOUS | Status: AC
  Administered 2020-03-27: 2 g via INTRAVENOUS
  Filled 2020-03-27: qty 20

## 2020-03-27 MED ORDER — FUROSEMIDE 40 MG PO TABS: 40.0000 mg | ORAL_TABLET | Freq: Every day | ORAL | 1 refills | Status: DC

## 2020-03-27 MED ORDER — ALBUTEROL SULFATE (2.5 MG/3ML) 0.083% IN NEBU: 2.5000 mg | INHALATION_SOLUTION | RESPIRATORY_TRACT | 12 refills | Status: AC | PRN

## 2020-03-27 MED ORDER — POTASSIUM CHLORIDE CRYS ER 20 MEQ PO TBCR
40.0000 meq | EXTENDED_RELEASE_TABLET | Freq: Once | ORAL | Status: AC
  Administered 2020-03-27: 40 meq via ORAL
  Filled 2020-03-27: qty 4

## 2020-03-27 MED ORDER — MIRTAZAPINE 7.5 MG PO TABS: 7.5000 mg | ORAL_TABLET | Freq: Every day | ORAL | 2 refills | Status: DC

## 2020-03-27 MED ORDER — POTASSIUM CHLORIDE CRYS ER 20 MEQ PO TBCR: 20.0000 meq | EXTENDED_RELEASE_TABLET | Freq: Two times a day (BID) | ORAL | 5 refills | Status: DC

## 2020-03-27 MED ORDER — POTASSIUM CHLORIDE CRYS ER 20 MEQ PO TBCR
40.0000 meq | EXTENDED_RELEASE_TABLET | ORAL | Status: AC
  Administered 2020-03-27 (×2): 40 meq via ORAL
  Filled 2020-03-27 (×2): qty 2

## 2020-03-27 NOTE — Progress Notes (Signed)
Nsg Discharge Note  Admit Date:  03/25/2020 Discharge date: 03/27/2020   Laura Jacobs to be D/C'd Home per MD order.  AVS completed.  Copy for chart, and copy for patient signed, and dated. Patient/caregiver able to verbalize understanding.  Discharge Medication: Allergies as of 03/27/2020   No Known Allergies     Medication List    TAKE these medications   albuterol 108 (90 Base) MCG/ACT inhaler Commonly known as: VENTOLIN HFA Inhale 2 puffs into the lungs every 6 (six) hours as needed. What changed: Another medication with the same name was added. Make sure you understand how and when to take each.   albuterol (2.5 MG/3ML) 0.083% nebulizer solution Commonly known as: PROVENTIL Take 3 mLs (2.5 mg total) by nebulization every 2 (two) hours as needed for wheezing or shortness of breath. What changed: You were already taking a medication with the same name, and this prescription was added. Make sure you understand how and when to take each.   amoxicillin-clavulanate 875-125 MG tablet Commonly known as: Augmentin Take 1 tablet by mouth 2 (two) times daily for 7 days.   apixaban 2.5 MG Tabs tablet Commonly known as: ELIQUIS Take 1 tablet (2.5 mg total) by mouth 2 (two) times daily.   atorvastatin 40 MG tablet Commonly known as: LIPITOR Take 1 tablet (40 mg total) by mouth every evening.   calcium-vitamin D 500-200 MG-UNIT tablet Commonly known as: Oscal 500/200 D-3 Take 1 tablet by mouth 2 (two) times daily.   citalopram 20 MG tablet Commonly known as: CeleXA Take 1 tablet (20 mg total) by mouth daily.   Compressor/Nebulizer Misc 1 Units by Does not apply route 3 times/day as needed-between meals & bedtime. -Nebulizer machine with tubing supplies to be used for administering nebulized medications as prescribed for COPD   Compressor/Nebulizer Misc 1 Units by Does not apply route 2 (two) times daily as needed. -Nebulizer machine with tubing supplies to be used for  administering nebulized medications as prescribed for COPD   dextromethorphan-guaiFENesin 30-600 MG 12hr tablet Commonly known as: MUCINEX DM Take 1 tablet by mouth 2 (two) times daily as needed for cough. What changed:   when to take this  reasons to take this   ezetimibe 10 MG tablet Commonly known as: ZETIA Take 1 tablet (10 mg total) by mouth daily.   Fluticasone-Salmeterol 250-50 MCG/DOSE Aepb Commonly known as: Advair Diskus Inhale 1 puff into the lungs 2 (two) times daily.   furosemide 40 MG tablet Commonly known as: LASIX Take 1 tablet (40 mg total) by mouth daily.   guaiFENesin 600 MG 12 hr tablet Commonly known as: MUCINEX Take 1 tablet (600 mg total) by mouth 2 (two) times daily.   ipratropium-albuterol 0.5-2.5 (3) MG/3ML Soln Commonly known as: DUONEB Take 3 mLs by nebulization 2 (two) times daily.   metoprolol tartrate 25 MG tablet Commonly known as: LOPRESSOR Take 0.5 tablets (12.5 mg total) by mouth 2 (two) times daily. What changed:   how much to take  how to take this  when to take this  additional instructions   mirtazapine 7.5 MG tablet Commonly known as: REMERON Take 1 tablet (7.5 mg total) by mouth at bedtime.   Nitrostat 0.4 MG SL tablet Generic drug: nitroGLYCERIN PLACE 1 TABLET UNDER THE TONGUE EVERY 5 MINUTES AS NEEDED FOR CHEST PA IN   potassium chloride SA 20 MEQ tablet Commonly known as: [place]-Con M20 Take 1 tablet (20 mEq total) by mouth 2 (two) times daily.  Durable Medical Equipment  (From admission, onward)         Start     Ordered   03/27/20 1124  For home use only DME Nebulizer machine  Once       Comments: -Nebulizer machine with tubing supplies to be used for administering nebulized medications as prescribed for COPD  Question Answer Comment  Patient needs a nebulizer to treat with the following condition COPD with acute exacerbation ([place])   Length of Need Lifetime      03/27/20 1123   03/27/20  1122  For home use only DME Nebulizer/meds  Once       Comments: -Nebulizer machine with tubing supplies to be used for administering nebulized medications as prescribed for COPD  Question Answer Comment  Patient needs a nebulizer to treat with the following condition Dyspnea and respiratory abnormalities   Length of Need Lifetime      03/27/20 1122          Discharge Assessment: Vitals:   03/26/20 2129 03/27/20 0530  BP: (!) 103/59 111/75  Pulse: 85 72  Resp: 16 16  Temp: 98.8 F (37.1 C) 99.1 F (37.3 C)  SpO2: 98% 97%   Skin clean, dry and intact without evidence of skin break down, no evidence of skin tears noted. IV catheter discontinued intact. Site without signs and symptoms of complications - no redness or edema noted at insertion site, patient denies c/o pain - only slight tenderness at site.  Dressing with slight pressure applied.  D/c Instructions-Education: Discharge instructions given to patient/family with verbalized understanding. D/c education completed with patient/family including follow up instructions, medication list, d/c activities limitations if indicated, with other d/c instructions as indicated by MD - patient able to verbalize understanding, all questions fully answered. Patient instructed to return to ED, call 911, or call MD for any changes in condition.  Patient escorted via [place], and D/C home via private auto.  Loa Socks, RN 03/27/2020 1:38 PM

## 2020-03-27 NOTE — Discharge Instructions (Signed)
1) you are taking apixaban/Eliquis which is your blood thinner so Avoid ibuprofen/Advil/Aleve/Motrin/Goody Powders/Naproxen/BC powders/Meloxicam/Diclofenac/Indomethacin and other Nonsteroidal anti-inflammatory medications as these will make you more likely to bleed and can cause stomach ulcers, can also cause Kidney problems.   2)Use -Nebulizer machine with tubing supplies to be used for administering nebulized medications as prescribed for COPD  3) please take Augmentin antibiotic as prescribed  4) you will need to follow-up with primary care physician in about 7 to 10 days for reevaluation and for repeat chest x-ray

## 2020-03-27 NOTE — Care Management Important Message (Signed)
Important Message  Patient Details  Name: Laura Jacobs MRN: [id number] Date of Birth: [birth year]   Medicare Important Message Given:  Yes   Late entry, letter given at 11:30am  Tommy Medal 03/27/2020, 2:32 PM

## 2020-03-27 NOTE — Telephone Encounter (Signed)
° ° °  Transitional Care Management  Contact Attempt Attempt Date:03/27/2020 Attempted By: Eston Mould, LPN  1st unsuccessful TCM contact attempt.   I reached out to Laura Jacobs on her preferred telephone number to discuss Transitional Care Management, medication reconciliation, and to schedule a TCM hospital follow-up with her PCP at [facility].  Discharge Date: 03/27/20 Location: [facility] Discharge Dx: Community Acquired Pneumonia  Recommendations for Outpatient Follow-up:   (insert from discharge summary) 1 Follow up with [facility] PT RN 2 Follow up with [facility]; nebulizer machine 3 Schedule an appointment with [name], FNP (Family Medicine) in 10 days (04/06/2020)   Plan I left a HIPPA compliant message for her to return my call.  Will attempt to contact again within the 2 business day post discharge window if she does not return my call.

## 2020-03-27 NOTE — Discharge Summary (Signed)
[name], is a 86 y.o. female  DOB 08-12-1934  MRN [id number].  Admission date:  03/25/2020  Admitting Physician  Neel Buffone Denton Brick, MD  Discharge Date:  03/27/2020   Primary MD  [name], FNP  Recommendations for primary care physician for things to follow:   1) you are taking apixaban/Eliquis which is your blood thinner so Avoid ibuprofen/Advil/Aleve/Motrin/Goody Powders/Naproxen/BC powders/Meloxicam/Diclofenac/Indomethacin and other Nonsteroidal anti-inflammatory medications as these will make you more likely to bleed and can cause stomach ulcers, can also cause Kidney problems.   2)Use -Nebulizer machine with tubing supplies to be used for administering nebulized medications as prescribed for COPD  3) please take Augmentin antibiotic as prescribed  4) you will need to follow-up with primary care physician in about 7 to 10 days for reevaluation and for repeat chest x-ray Admission Diagnosis  Pneumonia [J18.9] Chronic anticoagulation [Z79.01] Renal insufficiency [N28.9] Chronic atrial fibrillation (HCC) [I48.20] Prerenal azotemia [R79.89] Elevated bilirubin [R17] Normochromic normocytic anemia [D64.9] Community acquired pneumonia, unspecified laterality [J18.9] PNA (pneumonia) [J18.9]   Discharge Diagnosis  Pneumonia [J18.9] Chronic anticoagulation [Z79.01] Renal insufficiency [N28.9] Chronic atrial fibrillation (HCC) [I48.20] Prerenal azotemia [R79.89] Elevated bilirubin [R17] Normochromic normocytic anemia [D64.9] Community acquired pneumonia, unspecified laterality [J18.9] PNA (pneumonia) [J18.9]    Principal Problem:   Pneumonia Active Problems:   Hyperlipidemia   Hypertensive heart disease    Paroxysmal atrial fibrillation (HCC)   Dehydration   PNA (pneumonia)      Past Medical History:  Diagnosis Date  . Acute MI ([place])   . Atrial fibrillation,  unspecified   . CAD (coronary artery disease)   . Gout   . Hyperlipidemia   . Hypertension     Past Surgical History:  Procedure Laterality Date  . heart by pass       HPI  from the history and physical done on the day of admission:    Chief Complaint: Cough and dehydration  HPI: Laura Jacobs is a 86 y.o. female with medical history significant of hypertension, hyperlipidemia, atrial fibrillation on Eliquis, CKD and anxiety/depression is brought to ED for evaluation of severe episodic cough and dehydration.  Patient is brought to the hospital by her family because of concerns of dehydration due to poor oral intake for the last 2 days.  Patient states that he started having cough for the last 3 days.  Cough was dry earlier on but now become productive with a small amount of yellowish sputum.  Patient is also having poor appetite but denies fever, chills, chest pain, shortness of breath, nausea, vomiting, abdominal pain and urinary symptoms.    ED Course: Arrival to the ED patient had temperature of 98.9, blood pressure 133/83, heart rate 118, respiratory rate 18 and oxygen saturation 96% on room air.  Blood work showed leukocytosis with WBC count of 23.9, hemoglobin 11.4, sodium 132, potassium 4.0, BUN 32, creatinine 1.87, blood glucose 126 chest x-ray showed worsening patchy bibasilar airspace disease representing community-acquired pneumonia versus aspiration.  Patient was given IV ceftriaxone and azithromycin  in the ED and also IV fluids because of elevated BUN and creatinine secondary to dehydration     Hospital Course:    BriefSummary 86 y.o.femalewith medical history significant ofHTN, HLD, chronic atrial fibrillation on Eliquis, CKD IV , HFpEF and anxiety/depressionadmitted CAP----chest x-rayon admission showed worsening patchy bibasilar airspace disease representing community-acquired pneumonia Versus aspiration. -Please note that patient was also admitted and  treated for sepsis secondary to pneumonia from 02/09/2020 thru 02/11/2020   A/p 1)Recurrent CAP-- ?? aspiration related --- recent inpatient treatment for pneumonia , please see above  -WBC down to 9.1 from  23.9,  Treated with iv Rocephin and azithromycin,   -Speech pathologist eval appreciated recommends regular diet -Mucolytics and bronchodilators as ordered -Patient ambulated over 100 feet, no significant dyspnea on exertion, O2 sats post ambulation 99 to 100% on room air -Discharge home on p.o. Augmentin -Chest x-ray suggest progression of right lower lobe pneumonia however clinically patient is much improved from a respiratory standpoint, hypoxia has resolved, no dyspnea on exertion, leukocytosis is resolved, -I suspect radiological findings are lagging behind a clinical and lab improvement -Recommend repeat chest x-ray with PCP in about 10 days after completing Augmentin  2)CKD stage -IV---Creatinine is 1.37 from 1.8 on admission --renally adjust medications, avoid nephrotoxic agents / dehydration / hypotension  3)PAFib--continue Eliquis 2.5 mg twice daily for stroke prophylaxis -EF 55% based on echo from May 2021 -Continue metoprolol 25 mg p.o. twice daily for rate control  4)Chronic normocytic and normochromic anemia---hemoglobin currently down to 8.9 suspect some degree of hemodilution -Repeat CBC with PCP within a week  5)dementia/depression--baseline mild cognitive and memory deficits, continue Celexa and Remeron -According to patient's 2 sons who are at bedside this is patient's baseline  6)social/ethics--plan of care and advanced directives discussed with patient son at bedside, patient is a full code  7)COPD--she has a reformed smoker, no acute exacerbation at this time continue bronchodilators and mucolytics,  8)HFpEF--- clinically suspect some degree of chronic diastolic dysfunction CHF without flareup at this time actually appears dry on admission, no  evidence of volume overload, -Continue metoprolol  Disposition--discharge home with family and home health services  Code Status : full  Family Communication--(Discussed with sons x 2 at bedside  Consults  : Speech pathologist  Discharge Condition: stable  Follow UP   Follow-up [place] Follow [facility].   Specialty: Home Health Services Why:  PT  RN       [place] Follow up.   Why:  nebulizer machine       [name], FNP. Schedule an appointment as soon as possible for a visit in 10 day(s).   Specialty: Family Medicine Contact information: [place] [address] [phone]               Diet and Activity recommendation:  As advised  Discharge Instructions    Discharge Instructions    Call MD for:  persistant dizziness or light-headedness   Complete by: As directed    Call MD for:  persistant nausea and vomiting   Complete by: As directed    Call MD for:  severe uncontrolled pain   Complete by: As directed    Call MD for:  temperature >100.4   Complete by: As directed    Diet - low sodium heart healthy   Complete by: As directed    Discharge instructions   Complete by: As directed    1) you are taking  apixaban/Eliquis which is your blood thinner so Avoid ibuprofen/Advil/Aleve/Motrin/Goody Powders/Naproxen/BC powders/Meloxicam/Diclofenac/Indomethacin and other Nonsteroidal anti-inflammatory medications as these will make you more likely to bleed and can cause stomach ulcers, can also cause Kidney problems.   2)Use -Nebulizer machine with tubing supplies to be used for administering nebulized medications as prescribed for COPD  3) please take Augmentin antibiotic as prescribed  4) you will need to follow-up with primary care physician in about 7 to 10 days for reevaluation and for repeat chest x-ray   Increase activity slowly   Complete by: As directed         Discharge  Medications     Allergies as of 03/27/2020   No Known Allergies     Medication List    TAKE these medications   albuterol 108 (90 Base) MCG/ACT inhaler Commonly known as: VENTOLIN HFA Inhale 2 puffs into the lungs every 6 (six) hours as needed. What changed: Another medication with the same name was added. Make sure you understand how and when to take each.   albuterol (2.5 MG/3ML) 0.083% nebulizer solution Commonly known as: PROVENTIL Take 3 mLs (2.5 mg total) by nebulization every 2 (two) hours as needed for wheezing or shortness of breath. What changed: You were already taking a medication with the same name, and this prescription was added. Make sure you understand how and when to take each.   amoxicillin-clavulanate 875-125 MG tablet Commonly known as: Augmentin Take 1 tablet by mouth 2 (two) times daily for 7 days.   apixaban 2.5 MG Tabs tablet Commonly known as: ELIQUIS Take 1 tablet (2.5 mg total) by mouth 2 (two) times daily.   atorvastatin 40 MG tablet Commonly known as: LIPITOR Take 1 tablet (40 mg total) by mouth every evening.   calcium-vitamin D 500-200 MG-UNIT tablet Commonly known as: Oscal 500/200 D-3 Take 1 tablet by mouth 2 (two) times daily.   citalopram 20 MG tablet Commonly known as: CeleXA Take 1 tablet (20 mg total) by mouth daily.   Compressor/Nebulizer Misc 1 Units by Does not apply route 3 times/day as needed-between meals & bedtime. -Nebulizer machine with tubing supplies to be used for administering nebulized medications as prescribed for COPD   Compressor/Nebulizer Misc 1 Units by Does not apply route 2 (two) times daily as needed. -Nebulizer machine with tubing supplies to be used for administering nebulized medications as prescribed for COPD   dextromethorphan-guaiFENesin 30-600 MG 12hr tablet Commonly known as: MUCINEX DM Take 1 tablet by mouth 2 (two) times daily as needed for cough. What changed:   when to take this  reasons to  take this   ezetimibe 10 MG tablet Commonly known as: ZETIA Take 1 tablet (10 mg total) by mouth daily.   Fluticasone-Salmeterol 250-50 MCG/DOSE Aepb Commonly known as: Advair Diskus Inhale 1 puff into the lungs 2 (two) times daily.   furosemide 40 MG tablet Commonly known as: LASIX Take 1 tablet (40 mg total) by mouth daily.   guaiFENesin 600 MG 12 hr tablet Commonly known as: MUCINEX Take 1 tablet (600 mg total) by mouth 2 (two) times daily.   ipratropium-albuterol 0.5-2.5 (3) MG/3ML Soln Commonly known as: DUONEB Take 3 mLs by nebulization 2 (two) times daily.   metoprolol tartrate 25 MG tablet Commonly known as: LOPRESSOR Take 0.5 tablets (12.5 mg total) by mouth 2 (two) times daily. What changed:   how much to take  how to take this  when to take this  additional instructions   mirtazapine 7.5  MG tablet Commonly known as: REMERON Take 1 tablet (7.5 mg total) by mouth at bedtime.   Nitrostat 0.4 MG SL tablet Generic drug: nitroGLYCERIN PLACE 1 TABLET UNDER THE TONGUE EVERY 5 MINUTES AS NEEDED FOR CHEST PA IN   potassium chloride SA 20 MEQ tablet Commonly known as: Klor-Con M20 Take 1 tablet (20 mEq total) by mouth 2 (two) times daily.            Durable Medical Equipment  (From admission, onward)         Start     Ordered   03/27/20 1124  For home use only DME Nebulizer machine  Once       Comments: -Nebulizer machine with tubing supplies to be used for administering nebulized medications as prescribed for COPD  Question Answer Comment  Patient needs a nebulizer to treat with the following condition COPD with acute exacerbation ([place])   Length of Need Lifetime      03/27/20 1123   03/27/20 1122  For home use only DME Nebulizer/meds  Once       Comments: -Nebulizer machine with tubing supplies to be used for administering nebulized medications as prescribed for COPD  Question Answer Comment  Patient needs a nebulizer to treat with the following  condition Dyspnea and respiratory abnormalities   Length of Need Lifetime      03/27/20 1122         Major procedures and Radiology Reports - PLEASE review detailed and final reports for all details, in brief -   DG Chest 1 View  Result Date: 03/27/2020 CLINICAL DATA:  Short of breath EXAM: CHEST  1 VIEW COMPARISON:  03/25/2020 FINDINGS: Progression of right lower lobe airspace disease, possible pneumonia. Left lung remains clear. Postop CABG. Negative for heart failure or edema. Apical pleural thickening and calcification unchanged. IMPRESSION: Progression of right lower lobe airspace disease possible pneumonia. Electronically Signed   By: Franchot Gallo M.D.   On: 03/27/2020 08:46   DG Chest Portable 1 View  Result Date: 03/25/2020 CLINICAL DATA:  Productive cough. EXAM: PORTABLE CHEST 1 VIEW COMPARISON:  Chest radiograph 03/02/2020, 02/09/2020. lung bases from abdominal CT 02/09/2020 FINDINGS: Post median sternotomy and CABG. Mild cardiomegaly with aortic atherosclerosis. Chronic hyperinflation. Worsening patchy bibasilar airspace disease. No pleural fluid or pneumothorax. No pulmonary edema. Stable osseous structures. IMPRESSION: 1. Worsening patchy bibasilar airspace disease, may represent pneumonia or aspiration. This appears similar to 02/09/2020 imaging, with interval clearing on intervening the 03/02/2020 chest radiograph. 2. Chronic hyperinflation. 3. Mild cardiomegaly.  Post CABG. Aortic Atherosclerosis (ICD10-I70.0). Electronically Signed   By: Keith Rake M.D.   On: 03/25/2020 01:39    Micro Results   Recent Results (from the past 240 hour(s))  Culture, blood (routine x 2)     Status: None (Preliminary result)   Collection Time: 03/25/20  3:10 AM   Specimen: BLOOD RIGHT FOREARM  Result Value Ref Range Status   Specimen Description BLOOD RIGHT FOREARM  Final   Special Requests   Final    BOTTLES DRAWN AEROBIC AND ANAEROBIC Blood Culture adequate volume   Culture   Final     NO GROWTH 2 DAYS Performed at [facility], [address], [place], [place] [address]    Report Status PENDING  Incomplete  SARS Coronavirus 2 by RT PCR (hospital order, performed in [place] hospital lab) Nasopharyngeal Nasopharyngeal Swab     Status: None   Collection Time: 03/25/20  3:10 AM   Specimen: Nasopharyngeal Swab  Result Value Ref Range Status   SARS Coronavirus 2 NEGATIVE NEGATIVE Final    Comment: (NOTE) SARS-CoV-2 target nucleic acids are NOT DETECTED.  The SARS-CoV-2 RNA is generally detectable in upper and lower respiratory specimens during the acute phase of infection. The lowest concentration of SARS-CoV-2 viral copies this assay can detect is 250 copies / mL. A negative result does not preclude SARS-CoV-2 infection and should not be used as the sole basis for treatment or other patient management decisions.  A negative result may occur with improper specimen collection / handling, submission of specimen other than nasopharyngeal swab, presence of viral mutation(s) within the areas targeted by this assay, and inadequate number of viral copies (<250 copies / mL). A negative result must be combined with clinical observations, patient history, and epidemiological information.  Fact Sheet for Patients:   StrictlyIdeas.no  Fact Sheet for Healthcare Providers: BankingDealers.co.za  This test is not yet approved or  cleared by the [place] FDA and has been authorized for detection and/or diagnosis of SARS-CoV-2 by FDA under an Emergency Use Authorization (EUA).  This EUA will remain in effect (meaning this test can be used) for the duration of the COVID-19 declaration under Section 564(b)(1) of the Act, 21 U.S.C. section 360bbb-3(b)(1), unless the authorization is terminated or revoked sooner.  Performed at [facility], [address] [place], [place] [address]   Culture, blood (routine x 2)     Status:  None (Preliminary result)   Collection Time: 03/25/20  3:15 AM   Specimen: Right Antecubital; Blood  Result Value Ref Range Status   Specimen Description RIGHT ANTECUBITAL  Final   Special Requests   Final    BOTTLES DRAWN AEROBIC AND ANAEROBIC Blood Culture adequate volume   Culture   Final    NO GROWTH 2 DAYS Performed at [facility], [address] [place], [place] [address]    Report Status PENDING  Incomplete       Today   Subjective    Raea Magallon today has no new complaints -Ambulated over 100 feet with her sons without no dyspnea on exertion no hypoxia, actually O2 sats 99 to 100% on room air post ambulation          Patient has been seen and examined prior to discharge   Objective   Blood pressure 111/75, pulse 72, temperature 99.1 F (37.3 C), temperature source Oral, resp. rate 16, height 5\' 4"  (1.626 m), weight 51 kg, SpO2 97 %.   Intake/Output Summary (Last 24 hours) at 03/27/2020 1141 Last data filed at 03/27/2020 0900 Gross per 24 hour  Intake 480 ml  Output 1400 ml  Net -920 ml    Exam Gen:- Awake Alert, no acute distress  HEENT:- [place].AT, No sclera icterus Neck-Supple Neck,No JVD,.  Lungs-improved air movement, no wheezing, no rhonchi  CV- S1, S2 normal, regular, prior sternotomy scar Abd-  +ve B.Sounds, Abd Soft, No tenderness,    Extremity/Skin:- No  edema,   good pulses Psych-affect is appropriate, oriented x3 Neuro-generalized weakness , no new focal deficits, no tremors    Data Review   CBC w Diff:  Lab Results  Component Value Date   WBC 9.1 03/27/2020   HGB 8.9 (L) 03/27/2020   HGB 11.6 02/17/2020   HCT 28.6 (L) 03/27/2020   HCT 34.7 02/17/2020   PLT 213 03/27/2020   PLT 315 02/17/2020   LYMPHOPCT 4 03/25/2020   MONOPCT 8 03/25/2020   EOSPCT 1 03/25/2020   BASOPCT 0  03/25/2020    CMP:  Lab Results  Component Value Date   NA 140 03/27/2020   NA 141 02/17/2020   K 3.2 (L) 03/27/2020   CL 105 03/27/2020   CO2 26  03/27/2020   BUN 24 (H) 03/27/2020   BUN 19 02/17/2020   CREATININE 1.37 (H) 03/27/2020   CREATININE 1.43 (H) 02/04/2013   PROT 7.8 03/25/2020   PROT 7.1 08/21/2018   ALBUMIN 3.3 (L) 03/25/2020   ALBUMIN 4.4 08/21/2018   BILITOT 1.8 (H) 03/25/2020   BILITOT 1.1 08/21/2018   ALKPHOS 88 03/25/2020   AST 24 03/25/2020   ALT 18 03/25/2020  .   Total Discharge time is about 33 minutes  Roxan Hockey M.D on 03/27/2020 at 11:41 AM  Go to [email or web address] -  for contact info  [facility] - Office  [phone]

## 2020-03-27 NOTE — Telephone Encounter (Signed)
TRANSITIONAL CARE MANAGEMENT TELEPHONE OUTREACH NOTE   Contact Date: 03/27/2020 Contacted By: Eston Mould, LPN   DISCHARGE INFORMATION Date of Discharge:03/27/20 Discharge Facility: [facility] Principal Discharge Diagnosis:Community Acquired Pneumonia  Outpatient Follow Up Recommendations (copied from discharge summary) 1 Follow up with [facility] [place] [facility]); PT RN 2 Follow up with [place]; nebulizer machine 3 Schedule an appointment with [name], FNP (Family Medicine) in 10 days (04/06/2020)   Laura Jacobs is a female primary care patient of [name], [place]. An outgoing telephone call was made today and I spoke with her daughter.  Laura Jacobs condition(s) and treatment(s) were discussed. An opportunity to ask questions was provided and all were answered or forwarded as appropriate.    ACTIVITIES OF DAILY LIVING  Laura Jacobs lives with their daughter and she can perform most ADLs independently. her primary caregiver is her daughter. she is able to depend on her primary caregiver(s) for consistent help. Transportation to appointments, to pick up medications, and to run errands is not a problem.  (Consider referral to [facility] CCM if transportation or a consistent caregiver is a problem)   Fall Risk Fall Risk  03/13/2020 02/17/2020  Falls in the past year? 0 0  Number falls in past yr: - -  Injury with Fall? - -  Comment - -    medium [place] Modifications/Assistive Devices Wheelchair: No Cane: Yes Ramp: No Bedside Toilet: Yes Hospital Bed:  No Other: walker   [place] she is receiving home health Nursing and Physical therapy services.  [place] RECONCILIATION  Laura Jacobs has been able to pick-up all prescribed discharge medications from the pharmacy.   A post discharge medication reconciliation was performed and  the complete medication list was reviewed with the patient/caregiver and is current as of 03/27/2020. Changes highlighted below.  Discontinued Medications n/a  Current Medication List Allergies as of 03/27/2020   No Known Allergies     Medication List       Accurate as of March 27, 2020  4:28 PM. If you have any questions, ask your nurse or doctor.        albuterol 108 (90 Base) MCG/ACT inhaler Commonly known as: VENTOLIN HFA Inhale 2 puffs into the lungs every 6 (six) hours as needed.   albuterol (2.5 MG/3ML) 0.083% nebulizer solution Commonly known as: PROVENTIL Take 3 mLs (2.5 mg total) by nebulization every 2 (two) hours as needed for wheezing or shortness of breath.   amoxicillin-clavulanate 875-125 MG tablet Commonly known as: Augmentin Take 1 tablet by mouth 2 (two) times daily for 7 days.   apixaban 2.5 MG Tabs tablet Commonly known as: ELIQUIS Take 1 tablet (2.5 mg total) by mouth 2 (two) times daily.   atorvastatin 40 MG tablet Commonly known as: LIPITOR Take 1 tablet (40 mg total) by mouth every evening.   calcium-vitamin D 500-200 MG-UNIT tablet Commonly known as: Oscal 500/200 D-3 Take 1 tablet by mouth 2 (two) times daily.   citalopram 20 MG tablet Commonly known as: CeleXA Take 1 tablet (20 mg total) by mouth daily.   Compressor/Nebulizer Misc 1 Units by Does not apply route 3 times/day as needed-between meals & bedtime. -Nebulizer machine with tubing supplies to be used for administering nebulized medications as prescribed for COPD   dextromethorphan-guaiFENesin 30-600 MG 12hr tablet Commonly known as: MUCINEX DM Take 1 tablet by mouth 2 (two) times daily as  needed for cough.   ezetimibe 10 MG tablet Commonly known as: ZETIA Take 1 tablet (10 mg total) by mouth daily.   Fluticasone-Salmeterol 250-50 MCG/DOSE Aepb Commonly known as: Advair Diskus Inhale 1 puff into the lungs 2 (two) times daily.   furosemide 40 MG tablet Commonly known as:  LASIX Take 1 tablet (40 mg total) by mouth daily.   guaiFENesin 600 MG 12 hr tablet Commonly known as: MUCINEX Take 1 tablet (600 mg total) by mouth 2 (two) times daily.   ipratropium-albuterol 0.5-2.5 (3) MG/3ML Soln Commonly known as: DUONEB Take 3 mLs by nebulization 2 (two) times daily.   metoprolol tartrate 25 MG tablet Commonly known as: LOPRESSOR Take 0.5 tablets (12.5 mg total) by mouth 2 (two) times daily.   mirtazapine 7.5 MG tablet Commonly known as: REMERON Take 1 tablet (7.5 mg total) by mouth at bedtime.   Nitrostat 0.4 MG SL tablet Generic drug: nitroGLYCERIN PLACE 1 TABLET UNDER THE TONGUE EVERY 5 MINUTES AS NEEDED FOR CHEST PA IN   potassium chloride SA 20 MEQ tablet Commonly known as: Klor-Con M20 Take 1 tablet (20 mEq total) by mouth 2 (two) times daily.        PATIENT EDUCATION & FOLLOW-UP PLAN  An appointment for Transitional Care Management is scheduled with Benedetto Coons  on 04/04/20 at 12.  Take all medications as prescribed  Contact our office by calling [phone] if you have any questions or concerns

## 2020-03-27 NOTE — TOC Transition Note (Signed)
Transition of Care [facility]) - CM/SW Discharge Note   Patient Details  Name: Laura Jacobs MRN: [id number] Date of Birth: 06-Feb-1934  [facility] CM/SW Contact:  Boneta Lucks, RN Phone Number: 03/27/2020, 11:31 AM   Clinical Narrative:  Patient admitted with pneumonia, discharging today. Active with [facility] for [facility]. MD will add RN. Linda updated.  Patient also needs a Chemical engineer, Barbaraann Rondo with [employer] is here and will deliver.    Final next level of care: [place] Barriers to Discharge: Barriers Resolved   Patient Goals and CMS Choice Patient states their goals for this hospitalization and ongoing recovery are:: to go home. CMS Medicare.gov Compare Post Acute Care list provided to:: Patient Choice offered to / list presented to : Patient  Discharge Placement   Discharge Plan and Services             DME Arranged: 3-N-1, Nebulizer/meds DME Agency: [facility] Date DME Agency Contacted: 03/27/20 Time DME Agency Contacted: 0071 Representative spoke with at DME Agency: Barbaraann Rondo HH Arranged: RN, PT [facility] Agency: [place] ([place]) Date [place]: 03/27/20 Time [place]: 1130 Representative spoke with at [place]: Romualdo Bolk

## 2020-03-30 ENCOUNTER — Telehealth: Payer: Self-pay | Admitting: *Deleted

## 2020-03-30 DIAGNOSIS — I130 Hypertensive heart and chronic kidney disease with heart failure and stage 1 through stage 4 chronic kidney disease, or unspecified chronic kidney disease: Secondary | ICD-10-CM | POA: Diagnosis not present

## 2020-03-30 DIAGNOSIS — I5022 Chronic systolic (congestive) heart failure: Secondary | ICD-10-CM | POA: Diagnosis not present

## 2020-03-30 DIAGNOSIS — Z87440 Personal history of urinary (tract) infections: Secondary | ICD-10-CM | POA: Diagnosis not present

## 2020-03-30 DIAGNOSIS — E559 Vitamin D deficiency, unspecified: Secondary | ICD-10-CM | POA: Diagnosis not present

## 2020-03-30 DIAGNOSIS — N184 Chronic kidney disease, stage 4 (severe): Secondary | ICD-10-CM | POA: Diagnosis not present

## 2020-03-30 DIAGNOSIS — I480 Paroxysmal atrial fibrillation: Secondary | ICD-10-CM | POA: Diagnosis not present

## 2020-03-30 DIAGNOSIS — Z87891 Personal history of nicotine dependence: Secondary | ICD-10-CM | POA: Diagnosis not present

## 2020-03-30 DIAGNOSIS — M6281 Muscle weakness (generalized): Secondary | ICD-10-CM | POA: Diagnosis not present

## 2020-03-30 DIAGNOSIS — Z7901 Long term (current) use of anticoagulants: Secondary | ICD-10-CM | POA: Diagnosis not present

## 2020-03-30 DIAGNOSIS — E785 Hyperlipidemia, unspecified: Secondary | ICD-10-CM | POA: Diagnosis not present

## 2020-03-30 DIAGNOSIS — I2510 Atherosclerotic heart disease of native coronary artery without angina pectoris: Secondary | ICD-10-CM | POA: Diagnosis not present

## 2020-03-30 DIAGNOSIS — M810 Age-related osteoporosis without current pathological fracture: Secondary | ICD-10-CM | POA: Diagnosis not present

## 2020-03-30 LAB — CULTURE, BLOOD (ROUTINE X 2)
Culture: NO GROWTH
Culture: NO GROWTH
Special Requests: ADEQUATE
Special Requests: ADEQUATE

## 2020-03-30 NOTE — Telephone Encounter (Signed)
Increase metoprolol back to 25mg  and let me know if helps

## 2020-03-30 NOTE — Telephone Encounter (Signed)
Tina with [facility] made aware to have pt start back on Metoprolol 25mg  qd. She will make family aware.

## 2020-03-30 NOTE — Telephone Encounter (Signed)
Received call from [facility] nurse stating that patient was just discharge from hospital for pneumonia.  Patient metoprolol was switched for 25mg  once daily to 12.5mg  twice daily.  Patient HR is in the 120s-130s.  [place] nurse would like to know what to do?  Patient breathing treatment has been held at this time until instructed on further instruction on elevated HR

## 2020-04-03 DIAGNOSIS — Z7901 Long term (current) use of anticoagulants: Secondary | ICD-10-CM | POA: Diagnosis not present

## 2020-04-03 DIAGNOSIS — E559 Vitamin D deficiency, unspecified: Secondary | ICD-10-CM | POA: Diagnosis not present

## 2020-04-03 DIAGNOSIS — Z87440 Personal history of urinary (tract) infections: Secondary | ICD-10-CM | POA: Diagnosis not present

## 2020-04-03 DIAGNOSIS — N184 Chronic kidney disease, stage 4 (severe): Secondary | ICD-10-CM | POA: Diagnosis not present

## 2020-04-03 DIAGNOSIS — I480 Paroxysmal atrial fibrillation: Secondary | ICD-10-CM | POA: Diagnosis not present

## 2020-04-03 DIAGNOSIS — I2510 Atherosclerotic heart disease of native coronary artery without angina pectoris: Secondary | ICD-10-CM | POA: Diagnosis not present

## 2020-04-03 DIAGNOSIS — M6281 Muscle weakness (generalized): Secondary | ICD-10-CM | POA: Diagnosis not present

## 2020-04-03 DIAGNOSIS — E785 Hyperlipidemia, unspecified: Secondary | ICD-10-CM | POA: Diagnosis not present

## 2020-04-03 DIAGNOSIS — Z87891 Personal history of nicotine dependence: Secondary | ICD-10-CM | POA: Diagnosis not present

## 2020-04-03 DIAGNOSIS — I130 Hypertensive heart and chronic kidney disease with heart failure and stage 1 through stage 4 chronic kidney disease, or unspecified chronic kidney disease: Secondary | ICD-10-CM | POA: Diagnosis not present

## 2020-04-03 DIAGNOSIS — M810 Age-related osteoporosis without current pathological fracture: Secondary | ICD-10-CM | POA: Diagnosis not present

## 2020-04-03 DIAGNOSIS — I5022 Chronic systolic (congestive) heart failure: Secondary | ICD-10-CM | POA: Diagnosis not present

## 2020-04-04 ENCOUNTER — Encounter: Payer: Self-pay | Admitting: Nurse Practitioner

## 2020-04-04 ENCOUNTER — Ambulatory Visit: Payer: Medicare Other | Admitting: Nurse Practitioner

## 2020-04-06 DIAGNOSIS — Z7901 Long term (current) use of anticoagulants: Secondary | ICD-10-CM | POA: Diagnosis not present

## 2020-04-06 DIAGNOSIS — I5022 Chronic systolic (congestive) heart failure: Secondary | ICD-10-CM | POA: Diagnosis not present

## 2020-04-06 DIAGNOSIS — M6281 Muscle weakness (generalized): Secondary | ICD-10-CM | POA: Diagnosis not present

## 2020-04-06 DIAGNOSIS — I480 Paroxysmal atrial fibrillation: Secondary | ICD-10-CM | POA: Diagnosis not present

## 2020-04-06 DIAGNOSIS — M810 Age-related osteoporosis without current pathological fracture: Secondary | ICD-10-CM | POA: Diagnosis not present

## 2020-04-06 DIAGNOSIS — Z87440 Personal history of urinary (tract) infections: Secondary | ICD-10-CM | POA: Diagnosis not present

## 2020-04-06 DIAGNOSIS — E785 Hyperlipidemia, unspecified: Secondary | ICD-10-CM | POA: Diagnosis not present

## 2020-04-06 DIAGNOSIS — I2510 Atherosclerotic heart disease of native coronary artery without angina pectoris: Secondary | ICD-10-CM | POA: Diagnosis not present

## 2020-04-06 DIAGNOSIS — Z87891 Personal history of nicotine dependence: Secondary | ICD-10-CM | POA: Diagnosis not present

## 2020-04-06 DIAGNOSIS — E559 Vitamin D deficiency, unspecified: Secondary | ICD-10-CM | POA: Diagnosis not present

## 2020-04-06 DIAGNOSIS — N184 Chronic kidney disease, stage 4 (severe): Secondary | ICD-10-CM | POA: Diagnosis not present

## 2020-04-06 DIAGNOSIS — I130 Hypertensive heart and chronic kidney disease with heart failure and stage 1 through stage 4 chronic kidney disease, or unspecified chronic kidney disease: Secondary | ICD-10-CM | POA: Diagnosis not present

## 2020-04-07 DIAGNOSIS — N184 Chronic kidney disease, stage 4 (severe): Secondary | ICD-10-CM | POA: Diagnosis not present

## 2020-04-07 DIAGNOSIS — I480 Paroxysmal atrial fibrillation: Secondary | ICD-10-CM | POA: Diagnosis not present

## 2020-04-07 DIAGNOSIS — M6281 Muscle weakness (generalized): Secondary | ICD-10-CM | POA: Diagnosis not present

## 2020-04-07 DIAGNOSIS — E559 Vitamin D deficiency, unspecified: Secondary | ICD-10-CM | POA: Diagnosis not present

## 2020-04-07 DIAGNOSIS — Z87440 Personal history of urinary (tract) infections: Secondary | ICD-10-CM | POA: Diagnosis not present

## 2020-04-07 DIAGNOSIS — E785 Hyperlipidemia, unspecified: Secondary | ICD-10-CM | POA: Diagnosis not present

## 2020-04-07 DIAGNOSIS — I5022 Chronic systolic (congestive) heart failure: Secondary | ICD-10-CM | POA: Diagnosis not present

## 2020-04-07 DIAGNOSIS — I130 Hypertensive heart and chronic kidney disease with heart failure and stage 1 through stage 4 chronic kidney disease, or unspecified chronic kidney disease: Secondary | ICD-10-CM | POA: Diagnosis not present

## 2020-04-07 DIAGNOSIS — Z7901 Long term (current) use of anticoagulants: Secondary | ICD-10-CM | POA: Diagnosis not present

## 2020-04-07 DIAGNOSIS — I2510 Atherosclerotic heart disease of native coronary artery without angina pectoris: Secondary | ICD-10-CM | POA: Diagnosis not present

## 2020-04-07 DIAGNOSIS — M810 Age-related osteoporosis without current pathological fracture: Secondary | ICD-10-CM | POA: Diagnosis not present

## 2020-04-07 DIAGNOSIS — Z87891 Personal history of nicotine dependence: Secondary | ICD-10-CM | POA: Diagnosis not present

## 2020-04-12 DIAGNOSIS — E559 Vitamin D deficiency, unspecified: Secondary | ICD-10-CM | POA: Diagnosis not present

## 2020-04-12 DIAGNOSIS — I2510 Atherosclerotic heart disease of native coronary artery without angina pectoris: Secondary | ICD-10-CM | POA: Diagnosis not present

## 2020-04-12 DIAGNOSIS — Z87440 Personal history of urinary (tract) infections: Secondary | ICD-10-CM | POA: Diagnosis not present

## 2020-04-12 DIAGNOSIS — I130 Hypertensive heart and chronic kidney disease with heart failure and stage 1 through stage 4 chronic kidney disease, or unspecified chronic kidney disease: Secondary | ICD-10-CM | POA: Diagnosis not present

## 2020-04-12 DIAGNOSIS — I5022 Chronic systolic (congestive) heart failure: Secondary | ICD-10-CM | POA: Diagnosis not present

## 2020-04-12 DIAGNOSIS — I480 Paroxysmal atrial fibrillation: Secondary | ICD-10-CM | POA: Diagnosis not present

## 2020-04-12 DIAGNOSIS — Z7901 Long term (current) use of anticoagulants: Secondary | ICD-10-CM | POA: Diagnosis not present

## 2020-04-12 DIAGNOSIS — E785 Hyperlipidemia, unspecified: Secondary | ICD-10-CM | POA: Diagnosis not present

## 2020-04-12 DIAGNOSIS — Z87891 Personal history of nicotine dependence: Secondary | ICD-10-CM | POA: Diagnosis not present

## 2020-04-12 DIAGNOSIS — N184 Chronic kidney disease, stage 4 (severe): Secondary | ICD-10-CM | POA: Diagnosis not present

## 2020-04-12 DIAGNOSIS — M6281 Muscle weakness (generalized): Secondary | ICD-10-CM | POA: Diagnosis not present

## 2020-04-12 DIAGNOSIS — M810 Age-related osteoporosis without current pathological fracture: Secondary | ICD-10-CM | POA: Diagnosis not present

## 2020-04-13 DIAGNOSIS — Z87891 Personal history of nicotine dependence: Secondary | ICD-10-CM | POA: Diagnosis not present

## 2020-04-13 DIAGNOSIS — N184 Chronic kidney disease, stage 4 (severe): Secondary | ICD-10-CM | POA: Diagnosis not present

## 2020-04-13 DIAGNOSIS — M6281 Muscle weakness (generalized): Secondary | ICD-10-CM | POA: Diagnosis not present

## 2020-04-13 DIAGNOSIS — E785 Hyperlipidemia, unspecified: Secondary | ICD-10-CM | POA: Diagnosis not present

## 2020-04-13 DIAGNOSIS — I480 Paroxysmal atrial fibrillation: Secondary | ICD-10-CM | POA: Diagnosis not present

## 2020-04-13 DIAGNOSIS — Z7901 Long term (current) use of anticoagulants: Secondary | ICD-10-CM | POA: Diagnosis not present

## 2020-04-13 DIAGNOSIS — E559 Vitamin D deficiency, unspecified: Secondary | ICD-10-CM | POA: Diagnosis not present

## 2020-04-13 DIAGNOSIS — I2510 Atherosclerotic heart disease of native coronary artery without angina pectoris: Secondary | ICD-10-CM | POA: Diagnosis not present

## 2020-04-13 DIAGNOSIS — I5022 Chronic systolic (congestive) heart failure: Secondary | ICD-10-CM | POA: Diagnosis not present

## 2020-04-13 DIAGNOSIS — I130 Hypertensive heart and chronic kidney disease with heart failure and stage 1 through stage 4 chronic kidney disease, or unspecified chronic kidney disease: Secondary | ICD-10-CM | POA: Diagnosis not present

## 2020-04-13 DIAGNOSIS — M810 Age-related osteoporosis without current pathological fracture: Secondary | ICD-10-CM | POA: Diagnosis not present

## 2020-04-13 DIAGNOSIS — Z87440 Personal history of urinary (tract) infections: Secondary | ICD-10-CM | POA: Diagnosis not present

## 2020-04-14 ENCOUNTER — Encounter: Payer: Self-pay | Admitting: Nurse Practitioner

## 2020-04-14 ENCOUNTER — Telehealth: Payer: Self-pay | Admitting: *Deleted

## 2020-04-14 ENCOUNTER — Ambulatory Visit (INDEPENDENT_AMBULATORY_CARE_PROVIDER_SITE_OTHER): Payer: Medicare Other | Admitting: Nurse Practitioner

## 2020-04-14 ENCOUNTER — Other Ambulatory Visit: Payer: Self-pay

## 2020-04-14 VITALS — BP 116/77 | HR 109 | Temp 97.2°F | Resp 20 | Ht 64.0 in | Wt 112.0 lb

## 2020-04-14 DIAGNOSIS — L03113 Cellulitis of right upper limb: Secondary | ICD-10-CM | POA: Diagnosis not present

## 2020-04-14 DIAGNOSIS — S99829A Other specified injuries of unspecified foot, initial encounter: Secondary | ICD-10-CM

## 2020-04-14 DIAGNOSIS — R000 Tachycardia, unspecified: Secondary | ICD-10-CM | POA: Diagnosis not present

## 2020-04-14 DIAGNOSIS — Z09 Encounter for follow-up examination after completed treatment for conditions other than malignant neoplasm: Secondary | ICD-10-CM | POA: Diagnosis not present

## 2020-04-14 MED ORDER — METOPROLOL TARTRATE 25 MG PO TABS: 25.0000 mg | ORAL_TABLET | Freq: Two times a day (BID) | ORAL | 1 refills | Status: DC

## 2020-04-14 MED ORDER — SULFAMETHOXAZOLE-TRIMETHOPRIM 800-160 MG PO TABS: 1.0000 | ORAL_TABLET | Freq: Two times a day (BID) | ORAL | 0 refills | Status: DC

## 2020-04-14 NOTE — Patient Instructions (Signed)
Sinus Tachycardia  Sinus tachycardia is a kind of fast heartbeat. In sinus tachycardia, the heart beats more than 100 times a minute. Sinus tachycardia starts in a part of the heart called the sinus node. Sinus tachycardia may be harmless, or it may be a sign of a serious condition. What are the causes? This condition may be caused by:  Exercise or exertion.  A fever.  Pain.  Loss of body fluids (dehydration).  Severe bleeding (hemorrhage).  Anxiety and stress.  Certain substances, including: ? Alcohol. ? Caffeine. ? Tobacco and nicotine products. ? Cold medicines. ? Illegal drugs.  Medical conditions including: ? Heart disease. ? An infection. ? An overactive thyroid (hyperthyroidism). ? A lack of red blood cells (anemia). What are the signs or symptoms? Symptoms of this condition include:  A feeling that the heart is beating quickly (palpitations).  Suddenly noticing your heartbeat (cardiac awareness).  Dizziness.  Tiredness (fatigue).  Shortness of breath.  Chest pain.  Nausea.  Fainting. How is this diagnosed? This condition is diagnosed with:  A physical exam.  Other tests, such as: ? Blood tests. ? An electrocardiogram (ECG). This test measures the electrical activity of the heart. ? Ambulatory cardiac monitor. This records your heartbeats for 24 hours or more. You may be referred to a heart specialist (cardiologist). How is this treated? Treatment for this condition depends on the cause or the underlying condition. Treatment may involve:  Treating the underlying condition.  Taking new medicines or changing your current medicines as told by your health care provider.  Making changes to your diet or lifestyle. Follow these instructions at home: Lifestyle   Do not use any products that contain nicotine or tobacco, such as cigarettes and e-cigarettes. If you need help quitting, ask your health care provider.  Do not use illegal drugs, such as  cocaine.  Learn relaxation methods to help you when you get stressed or anxious. These include deep breathing.  Avoid caffeine or other stimulants. Alcohol use   Do not drink alcohol if: ? Your health care provider tells you not to drink. ? You are pregnant, may be pregnant, or are planning to become pregnant.  If you drink alcohol, limit how much you have: ? 0-1 drink a day for women. ? 0-2 drinks a day for men.  Be aware of how much alcohol is in your drink. In the [place] one drink equals one typical bottle of beer (12 oz), one-half glass of wine (5 oz), or one shot of hard liquor (1 oz). General instructions  Drink enough fluids to keep your urine pale yellow.  Take over-the-counter and prescription medicines only as told by your health care provider.  Keep all follow-up visits as told by your health care provider. This is important. Contact a health care provider if you have:  A fever.  Vomiting or diarrhea that does not go away. Get help right away if you:  Have pain in your chest, upper arms, jaw, or neck.  Become weak or dizzy.  Feel faint.  Have palpitations that do not go away. Summary  In sinus tachycardia, the heart beats more than 100 times a minute.  Sinus tachycardia may be harmless, or it may be a sign of a serious condition.  Treatment for this condition depends on the cause or the underlying condition.  Get help right away if you have pain in your chest, upper arms, jaw, or neck. This information is not intended to replace advice given to you by   your health care provider. Make sure you discuss any questions you have with your health care provider. Document Revised: 11/12/2017 Document Reviewed: 11/12/2017 Elsevier Patient Education  2020 Elsevier Inc.  

## 2020-04-14 NOTE — Progress Notes (Signed)
Subjective:    Patient ID: Laura Jacobs, female    DOB: 12/27/1933, 86 y.o.   MRN: [id number]   Chief Complaint: No chief complaint on file.   HPI Patient has had pneumonia 2x in 4 months and was just discharged. Breathing treatments are not helping. [place] nurse withheld breathing treatments because the patient's pulse was tachycardic at 130. Patient was taking 25 mg of metoprolol, was decreased to 12.5mg  BID. Was increased back to 25 mg once per day. Pulse rate today is 109 in the clinic.   Also c/o right hand swollen and red-slightly warm to touch. Denies injury  Review of Systems  Constitutional: Negative.   HENT: Negative.   Eyes: Negative.   Respiratory: Negative.   Cardiovascular: Negative.   Gastrointestinal: Negative.   Endocrine: Negative.   Genitourinary: Negative.   Musculoskeletal: Negative.   Skin: Negative.   Allergic/Immunologic: Negative.   Neurological: Negative.   Hematological: Negative.   Psychiatric/Behavioral: Negative.        Objective:   Physical Exam Vitals and nursing note reviewed.  Constitutional:      Appearance: Normal appearance. She is normal weight.  HENT:     Head: Normocephalic and atraumatic.     Right Ear: Tympanic membrane, ear canal and external ear normal.     Left Ear: Tympanic membrane, ear canal and external ear normal.     Nose: Nose normal.     Mouth/Throat:     Mouth: Mucous membranes are moist.     Pharynx: Oropharynx is clear.  Eyes:     Extraocular Movements: Extraocular movements intact.     Conjunctiva/sclera: Conjunctivae normal.     Pupils: Pupils are equal, round, and reactive to light.  Cardiovascular:     Rate and Rhythm: Regular rhythm. Tachycardia present.     Pulses: Normal pulses.     Heart sounds: Normal heart sounds.  Pulmonary:     Effort: Pulmonary effort is normal.     Breath sounds: Wheezing and rales present.     Comments: Deep wet cough Abdominal:     General: Abdomen is flat. Bowel  sounds are normal.     Palpations: Abdomen is soft.  Genitourinary:    General: Normal vulva.     Rectum: Normal.  Musculoskeletal:        General: Normal range of motion.     Cervical back: Normal range of motion and neck supple.     Right lower leg: Edema (1+) present.     Left lower leg: Edema (1+) present.     Comments: Right hand edema with erythema of middle finger. Pain on gripping.  Left foot nail avulsion   Skin:    General: Skin is warm and dry.     Capillary Refill: Capillary refill takes less than 2 seconds.  Neurological:     General: No focal deficit present.     Mental Status: She is alert and oriented to person, place, and time. Mental status is at baseline.  Psychiatric:        Mood and Affect: Mood normal.        Behavior: Behavior normal.        Thought Content: Thought content normal.        Judgment: Judgment normal.       BP 116/77   Pulse (!) 109   Temp (!) 97.2 F (36.2 C) (Temporal)   Resp 20   Ht 5\' 4"  (1.626 m)   Wt 112 lb (50.8 kg)  SpO2 99%   BMI 19.22 kg/m       Assessment & Plan:  Laura Jacobs in today with chief complaint of No chief complaint on file.   1. Sinus tachycardia Increased metropolol to 25mg  bid Keep check of heart rate at home Avoid cafffeine - metoprolol tartrate (LOPRESSOR) 25 MG tablet; Take 1 tablet (25 mg total) by mouth 2 (two) times daily.  Dispense: 60 tablet; Refill: 1  2. Cellulitis of right hand Soak in epsom salt - sulfamethoxazole-trimethoprim (BACTRIM DS) 800-160 MG tablet; Take 1 tablet by mouth 2 (two) times daily.  Dispense: 20 tablet; Refill: 0  3. Hospital discharge follow-up Hospital records reviewed    The above assessment and management plan was discussed with the patient. The patient verbalized understanding of and has agreed to the management plan. Patient is aware to call the clinic if symptoms persist or worsen. Patient is aware when to return to the clinic for a follow-up  visit. Patient educated on when it is appropriate to go to the emergency department.   [name], FNP

## 2020-04-14 NOTE — Telephone Encounter (Signed)
Tina from [facility] called and left VM stating pt is still wheezing in all lobes, has injured her right hand somehow (R little finger swollen along with the top of the hand)- Right great toe swollen and red (maybe gout) and Left great toenail is very thick and looks like there is blood under it.    FYI

## 2020-04-26 DIAGNOSIS — J189 Pneumonia, unspecified organism: Secondary | ICD-10-CM | POA: Diagnosis not present

## 2020-04-26 DIAGNOSIS — J690 Pneumonitis due to inhalation of food and vomit: Secondary | ICD-10-CM | POA: Diagnosis not present

## 2020-04-26 DIAGNOSIS — E860 Dehydration: Secondary | ICD-10-CM | POA: Diagnosis not present

## 2020-04-28 ENCOUNTER — Ambulatory Visit: Payer: Self-pay | Admitting: Nurse Practitioner

## 2020-05-02 ENCOUNTER — Encounter: Payer: Self-pay | Admitting: Nurse Practitioner

## 2020-05-04 ENCOUNTER — Ambulatory Visit (INDEPENDENT_AMBULATORY_CARE_PROVIDER_SITE_OTHER): Payer: Medicare Other | Admitting: *Deleted

## 2020-05-04 DIAGNOSIS — Z0000 Encounter for general adult medical examination without abnormal findings: Secondary | ICD-10-CM

## 2020-05-04 NOTE — Progress Notes (Signed)
MEDICARE ANNUAL WELLNESS VISIT  05/04/2020   Provider calling from [facility], Patient participating in call from home.   Telephone Visit Disclaimer This Medicare AWV was conducted by telephone due to national recommendations for restrictions regarding the COVID-19 Pandemic (e.g. social distancing).  I verified, using two identifiers, that I am speaking with Laura Jacobs or their authorized healthcare agent. I discussed the limitations, risks, security, and privacy concerns of performing an evaluation and management service by telephone and the potential availability of an in-person appointment in the future. The patient expressed understanding and agreed to proceed.   Subjective:  Laura Jacobs is a 86 y.o. female patient of Laura Jacobs, Laura Jacobs who had a Medicare Annual Wellness Visit today via telephone. Laura Jacobs is retired, widowed, and lives with her two sons and granddaughter. She has 6 sons total. She reports that she is socially active and does interact with friends/family regularly. She is not physically active and enjoys gardening.  Patient Care Team: Laura Pretty, FNP as PCP - General (Nurse Practitioner) Laura Reedy, MD as Consulting Physician (Cardiology) Laura Lowes, MD (Inactive) as Consulting Physician (Nephrology) Laura Jacobs, [place] as Consulting Physician (Optometry)  Advanced Directives 05/04/2020 03/25/2020 02/09/2020 02/04/2020 04/01/2019 05/14/2016 02/27/2015  Does Patient Have a Medical Advance Directive? Yes No No No No No No  Type of Advance Directive [place]  Does patient want to make changes to medical advance directive? No - Patient declined - - - - - -  Copy of [place] in Chart? No - copy requested - - - - - -  Would patient like information on creating a medical advance directive? - No - Patient declined No - Patient declined No - Patient declined No - Patient declined No -  patient declined information Yes - Educational materials given    Hospital Utilization Over the Past 12 Months: # of hospitalizations or ER visits: 3 # of surgeries: 0  Review of Systems    Patient reports that her overall health is worse compared to last year.  History obtained from the patient and patient chart.   Patient Reported Readings (BP, Pulse, CBG, Weight, etc) none  Pain Assessment Pain : No/denies pain     Current Medications & Allergies (verified) Allergies as of 05/04/2020   No Known Allergies     Medication List       Accurate as of May 04, 2020  3:05 PM. If you have any questions, ask your nurse or doctor.        albuterol 108 (90 Base) MCG/ACT inhaler Commonly known as: VENTOLIN HFA Inhale 2 puffs into the lungs every 6 (six) hours as needed.   albuterol (2.5 MG/3ML) 0.083% nebulizer solution Commonly known as: PROVENTIL Take 3 mLs (2.5 mg total) by nebulization every 2 (two) hours as needed for wheezing or shortness of breath.   apixaban 2.5 MG Tabs tablet Commonly known as: ELIQUIS Take 1 tablet (2.5 mg total) by mouth 2 (two) times daily.   atorvastatin 40 MG tablet Commonly known as: LIPITOR Take 1 tablet (40 mg total) by mouth every evening.   calcium-vitamin D 500-200 MG-UNIT tablet Commonly known as: Oscal 500/200 D-3 Take 1 tablet by mouth 2 (two) times daily.   citalopram 20 MG tablet Commonly known as: CeleXA Take 1 tablet (20 mg total) by mouth daily.   Compressor/Nebulizer Misc 1 Units by Does not apply route 3 times/day as needed-between meals &  bedtime. -Nebulizer machine with tubing supplies to be used for administering nebulized medications as prescribed for COPD   dextromethorphan-guaiFENesin 30-600 MG 12hr tablet Commonly known as: MUCINEX DM Take 1 tablet by mouth 2 (two) times daily as needed for cough.   ezetimibe 10 MG tablet Commonly known as: ZETIA Take 1 tablet (10 mg total) by mouth daily.     Fluticasone-Salmeterol 250-50 MCG/DOSE Aepb Commonly known as: Advair Diskus Inhale 1 puff into the lungs 2 (two) times daily.   furosemide 40 MG tablet Commonly known as: LASIX Take 1 tablet (40 mg total) by mouth daily.   guaiFENesin 600 MG 12 hr tablet Commonly known as: MUCINEX Take 1 tablet (600 mg total) by mouth 2 (two) times daily.   ipratropium-albuterol 0.5-2.5 (3) MG/3ML Soln Commonly known as: DUONEB Take 3 mLs by nebulization 2 (two) times daily.   metoprolol tartrate 25 MG tablet Commonly known as: LOPRESSOR Take 1 tablet (25 mg total) by mouth 2 (two) times daily.   mirtazapine 7.5 MG tablet Commonly known as: REMERON Take 1 tablet (7.5 mg total) by mouth at bedtime.   Nitrostat 0.4 MG SL tablet Generic drug: nitroGLYCERIN PLACE 1 TABLET UNDER THE TONGUE EVERY 5 MINUTES AS NEEDED FOR CHEST PA IN   potassium chloride SA 20 MEQ tablet Commonly known as: Klor-Con M20 Take 1 tablet (20 mEq total) by mouth 2 (two) times daily.   sulfamethoxazole-trimethoprim 800-160 MG tablet Commonly known as: Bactrim DS Take 1 tablet by mouth 2 (two) times daily.       History (reviewed): Past Medical History:  Diagnosis Date  . Acute MI ([place])   . Atrial fibrillation, unspecified   . CAD (coronary artery disease)   . Gout   . Hyperlipidemia   . Hypertension    Past Surgical History:  Procedure Laterality Date  . heart by pass     Family History  Problem Relation Age of Onset  . Heart disease Mother   . Alcohol abuse Father   . Alzheimer's disease Sister   . Cancer Sister    Social History   Socioeconomic History  . Marital status: Widowed    Spouse name: Homer  . Number of children: 6  . Years of education: 8  . Highest education level: 8th grade  Occupational History  . Occupation: retired  Tobacco Use  . Smoking status: Former Smoker    Packs/day: 0.50    Years: 60.00    Pack years: 30.00    Types: Cigarettes    Quit date: 08/07/2014    Years  since quitting: 5.7  . Smokeless tobacco: Former Systems developer    Quit date: 05/11/2014  Vaping Use  . Vaping Use: Never used  Substance and Sexual Activity  . Alcohol use: No  . Drug use: No  . Sexual activity: Yes    Birth control/protection: Post-menopausal  Other Topics Concern  . Not on file  Social History Narrative   Retired, widowed, lives with two sons and a granddaughter, enjoys gardening.    Social Determinants of Health   Financial Resource Strain:   . Difficulty of Paying Living Expenses:   Food Insecurity:   . Worried About Charity fundraiser in the Last Year:   . Arboriculturist in the Last Year:   Transportation Needs:   . Film/video editor (Medical):   Marland Kitchen Lack of Transportation (Non-Medical):   Physical Activity:   . Days of Exercise per Week:   . Minutes of Exercise  per Session:   Stress:   . Feeling of Stress :   Social Connections:   . Frequency of Communication with Friends and Family:   . Frequency of Social Gatherings with Friends and Family:   . Attends Religious Services:   . Active Member of Clubs or Organizations:   . Attends Archivist Meetings:   Marland Kitchen Marital Status:     Activities of Daily Living In your present state of health, do you have any difficulty performing the following activities: 05/04/2020 03/25/2020  Hearing? N N  Vision? N N  Difficulty concentrating or making decisions? N N  Walking or climbing stairs? N N  Dressing or bathing? N Y  Doing errands, shopping? Y N  Comment does not drive Facilities manager and eating ? N -  Using the Toilet? N -  In the past six months, have you accidently leaked urine? Y -  Comment few times -  Do you have problems with loss of bowel control? N -  Managing your Medications? N -  Managing your Finances? N -  Housekeeping or managing your Housekeeping? N -  Some recent data might be hidden    Patient Education/ Literacy How often do you need to have someone help you when you read  instructions, pamphlets, or other written materials from your doctor or pharmacy?: 1 - Never What is the last grade level you completed in school?: 9th  Exercise Current Exercise Habits: The patient does not participate in regular exercise at present, Exercise limited by: None identified  Diet Patient reports consuming 2 meals a day and 1 snack(s) a day Patient reports that her primary diet is: Regular Patient reports that she does have regular access to food.   Depression Screen PHQ 2/9 Scores 05/04/2020 03/13/2020 02/17/2020 11/15/2019 05/28/2019 04/01/2019 08/21/2018  PHQ - 2 Score 0 0 0 0 0 0 0     Fall Risk Fall Risk  05/04/2020 03/13/2020 02/17/2020 11/15/2019 05/28/2019  Falls in the past year? 0 0 0 0 0  Number falls in past yr: - - - - -  Injury with Fall? - - - - -  Comment - - - - -     Objective:  [place] seemed alert and oriented and she participated appropriately during our telephone visit.  Blood Pressure Weight BMI  BP Readings from Last 3 Encounters:  04/14/20 116/77  03/27/20 111/75  03/13/20 104/75   Wt Readings from Last 3 Encounters:  04/14/20 112 lb (50.8 kg)  03/25/20 112 lb 7 oz (51 kg)  02/17/20 115 lb (52.2 kg)   BMI Readings from Last 1 Encounters:  04/14/20 19.22 kg/m    *Unable to obtain current vital signs, weight, and BMI due to telephone visit type  Hearing/Vision  . [place] did seem to have difficulty with hearing during the telephone conversation . Reports that she has had a formal eye exam by an eye care professional within the past year . Reports that she has not had a formal hearing evaluation within the past year *Unable to fully assess hearing and vision during telephone visit type  Cognitive Function: 6CIT Screen 05/04/2020 04/01/2019  What Year? 0 points 0 points  What month? 0 points 0 points  What time? 0 points 0 points  Count back from 20 0 points 0 points  Months in reverse 0 points 4 points  Repeat phrase 10 points 2  points  Total Score 10 6   (Normal:0-7, Significant for  Dysfunction: >8)  Normal Cognitive Function Screening: No: 10   Immunization & Health Maintenance Record Immunization History  Administered Date(s) Administered  . Influenza, High Dose Seasonal PF 07/07/2014, 07/25/2016, 09/08/2017  . Pneumococcal Conjugate-13 02/23/2015  . Pneumococcal Polysaccharide-23 10/28/2016  . Td 05/08/1997, 12/09/2003    Health Maintenance  Topic Date Due  . COVID-19 Vaccine (1) Never done  . MAMMOGRAM  11/14/2020 (Originally 03/31/1952)  . DEXA SCAN  11/14/2020 (Originally 02/22/2017)  . TETANUS/TDAP  11/14/2020 (Originally 12/08/2013)  . INFLUENZA VACCINE  05/07/2020  . PNA vac Low Risk Adult  Completed       Assessment  This is a routine wellness examination for [facility] SCHUCHART.  Health Maintenance: Due or Overdue Health Maintenance Due  Topic Date Due  . COVID-19 Vaccine (1) Never done    Laura Jacobs does not need a referral for Community Assistance: Care Management:   no Social Work:    no Prescription Assistance:  no Nutrition/Diabetes Education:  no   Plan:  Personalized Goals Goals Addressed            This Visit's Progress   . Patient Stated       05/04/2020 AWV Goal: Exercise for General Health   Patient will verbalize understanding of the benefits of increased physical activity:  Exercising regularly is important. It will improve your overall fitness, flexibility, and endurance.  Regular exercise also will improve your overall health. It can help you control your weight, reduce stress, and improve your bone density.  Over the next year, patient will increase physical activity as tolerated with a goal of at least 150 minutes of moderate physical activity per week.   You can tell that you are exercising at a moderate intensity if your heart starts beating faster and you start breathing faster but can still hold a conversation.  Moderate-intensity  exercise ideas include:  Walking 1 mile (1.6 km) in about 15 minutes  Biking  Hiking  Golfing  Dancing  Water aerobics  Patient will verbalize understanding of everyday activities that increase physical activity by providing examples like the following: ? Yard work, such as: ? Pushing a Conservation officer, nature ? Raking and bagging leaves ? Washing your car ? Pushing a stroller ? Shoveling snow ? Gardening ? Washing windows or floors  Patient will be able to explain general safety guidelines for exercising:   Before you start a new exercise program, talk with your health care provider.  Do not exercise so much that you hurt yourself, feel dizzy, or get very short of breath.  Wear comfortable clothes and wear shoes with good support.  Drink plenty of water while you exercise to prevent dehydration or heat stroke.  Work out until your breathing and your heartbeat get faster.       Personalized Health Maintenance & Screening Recommendations    Lung Cancer Screening Recommended: no (Low Dose CT Chest recommended if Age 56-80 years, 30 pack-year currently smoking OR have quit w/in past 15 years) Hepatitis C Screening recommended: no HIV Screening recommended: no  Advanced Directives: Written information was not prepared per patient's request.  Referrals & Orders No orders of the defined types were placed in this encounter.   Follow-up Plan . Follow-up with Laura Pretty, FNP as planned   I have personally reviewed and noted the following in the patient's chart:   . Medical and social history . Use of alcohol, tobacco or illicit drugs  . Current medications and supplements . Functional ability  and status . Nutritional status . Physical activity . Advanced directives . List of other physicians . Hospitalizations, surgeries, and ER visits in previous 12 months . Vitals . Screenings to include cognitive, depression, and falls . Referrals and appointments  In  addition, I have reviewed and discussed with Laura Jacobs certain preventive protocols, quality metrics, and best practice recommendations. A written personalized care plan for preventive services as well as general preventive health recommendations is available and can be mailed to the patient at her request.      Baldomero Lamy, LPN  04/27/7736

## 2020-05-12 ENCOUNTER — Other Ambulatory Visit: Payer: Self-pay

## 2020-05-12 ENCOUNTER — Ambulatory Visit (INDEPENDENT_AMBULATORY_CARE_PROVIDER_SITE_OTHER): Payer: Medicare Other | Admitting: Nurse Practitioner

## 2020-05-12 ENCOUNTER — Encounter: Payer: Self-pay | Admitting: Nurse Practitioner

## 2020-05-12 VITALS — BP 135/76 | HR 83 | Temp 97.5°F | Ht 64.0 in | Wt 113.2 lb

## 2020-05-12 DIAGNOSIS — M79642 Pain in left hand: Secondary | ICD-10-CM | POA: Diagnosis not present

## 2020-05-12 NOTE — Patient Instructions (Addendum)
Pain of left hand Patient is a 86 year old female who presents to clinic with left hand pain.  Pain started approximately 2 days prior.  Patient unable to remember what caused the pain.  She is reporting pain of 1-2 out of 10 from a pain scale of 0-10.  Patient has not used any medication to relieve symptoms.  She describes pain as mild and intermittent. Advised patient to use Tylenol as needed, apply ice, rest arm.  And follow-up with worsening and unresolved symptoms. Education provided to patient with printed handouts given.    Hand Pain Many things can cause hand pain. Some common causes are:  An injury.  Repeating the same movement with your hand over and over (overuse).  Osteoporosis.  Arthritis.  Lumps in the tendons or joints of the hand and wrist (ganglion cysts).  Nerve compression syndromes (carpal tunnel syndrome).  Inflammation of the tendons (tendinitis).  Infection. Follow these instructions at home: Pay attention to any changes in your symptoms. Take these actions to help with your discomfort: Managing pain, stiffness, and swelling   Take over-the-counter and prescription medicines only as told by your health care provider.  Wear a hand splint or support as told by your health care provider.  If directed, put ice on the affected area: ? Put ice in a plastic bag. ? Place a towel between your skin and the bag. ? Leave the ice on for 20 minutes, 2-3 times a day. Activity  Take breaks from repetitive activity often.  Avoid activities that make your pain worse.  Minimize stress on your hands and wrists as much as possible.  Do stretches or exercises as told by your health care provider.  Do not do activities that make your pain worse. Contact a health care provider if:  Your pain does not get better after a few days of self-care.  Your pain gets worse.  Your pain affects your ability to do your daily activities. Get help right away if:  Your hand  becomes warm, red, or swollen.  Your hand is numb or tingling.  Your hand is extremely swollen or deformed.  Your hand or fingers turn white or blue.  You cannot move your hand, wrist, or fingers. Summary  Many things can cause hand pain.  Contact your health care provider if your pain does not get better after a few days of self care.  Minimize stress on your hands and wrists as much as possible.  Do not do activities that make your pain worse. This information is not intended to replace advice given to you by your health care provider. Make sure you discuss any questions you have with your health care provider. Document Revised: 06/19/2018 Document Reviewed: 06/19/2018 Elsevier Patient Education  [place].

## 2020-05-12 NOTE — Progress Notes (Signed)
Acute Office Visit  Subjective:    Patient ID: Laura Jacobs, female    DOB: 10/24/1933, 86 y.o.   MRN: [id number]  Chief Complaint  Patient presents with  . Edema    Bilateral hands     Hand Pain  The incident occurred 3 to 5 days ago. The incident occurred at home. There was no injury mechanism. The pain is present in the left hand. The quality of the pain is described as aching. The pain does not radiate. The pain is at a severity of 1/10. The pain is mild. The pain has been intermittent since the incident. Nothing aggravates the symptoms. She has tried nothing for the symptoms.     Past Medical History:  Diagnosis Date  . Acute MI ([place])   . Atrial fibrillation, unspecified   . CAD (coronary artery disease)   . Gout   . Hyperlipidemia   . Hypertension     Past Surgical History:  Procedure Laterality Date  . heart by pass      Family History  Problem Relation Age of Onset  . Heart disease Mother   . Alcohol abuse Father   . Alzheimer's disease Sister   . Cancer Sister     Social History   Socioeconomic History  . Marital status: Widowed    Spouse name: Homer  . Number of children: 6  . Years of education: 8  . Highest education level: 8th grade  Occupational History  . Occupation: retired  Tobacco Use  . Smoking status: Former Smoker    Packs/day: 0.50    Years: 60.00    Pack years: 30.00    Types: Cigarettes    Quit date: 08/07/2014    Years since quitting: 5.7  . Smokeless tobacco: Former Systems developer    Quit date: 05/11/2014  Vaping Use  . Vaping Use: Never used  Substance and Sexual Activity  . Alcohol use: No  . Drug use: No  . Sexual activity: Yes    Birth control/protection: Post-menopausal  Other Topics Concern  . Not on file  Social History Narrative   Retired, widowed, lives with two sons and a granddaughter, enjoys gardening.    Social Determinants of Health   Financial Resource Strain:   . Difficulty of Paying Living Expenses:     Food Insecurity:   . Worried About Charity fundraiser in the Last Year:   . Arboriculturist in the Last Year:   Transportation Needs:   . Film/video editor (Medical):   Marland Kitchen Lack of Transportation (Non-Medical):   Physical Activity:   . Days of Exercise per Week:   . Minutes of Exercise per Session:   Stress:   . Feeling of Stress :   Social Connections:   . Frequency of Communication with Friends and Family:   . Frequency of Social Gatherings with Friends and Family:   . Attends Religious Services:   . Active Member of Clubs or Organizations:   . Attends Archivist Meetings:   Marland Kitchen Marital Status:   Intimate Partner Violence:   . Fear of Current or Ex-Partner:   . Emotionally Abused:   Marland Kitchen Physically Abused:   . Sexually Abused:     Outpatient Medications Prior to Visit  Medication Sig Dispense Refill  . albuterol (PROVENTIL) (2.5 MG/3ML) 0.083% nebulizer solution Take 3 mLs (2.5 mg total) by nebulization every 2 (two) hours as needed for wheezing or shortness of breath. 75 mL 12  .  albuterol (VENTOLIN HFA) 108 (90 Base) MCG/ACT inhaler Inhale 2 puffs into the lungs every 6 (six) hours as needed. 18 g 2  . apixaban (ELIQUIS) 2.5 MG TABS tablet Take 1 tablet (2.5 mg total) by mouth 2 (two) times daily. 60 tablet 1  . atorvastatin (LIPITOR) 40 MG tablet Take 1 tablet (40 mg total) by mouth every evening. 30 tablet 3  . calcium-vitamin D (OSCAL 500/200 D-3) 500-200 MG-UNIT per tablet Take 1 tablet by mouth 2 (two) times daily.    . citalopram (CELEXA) 20 MG tablet Take 1 tablet (20 mg total) by mouth daily. 30 tablet 5  . dextromethorphan-guaiFENesin (MUCINEX DM) 30-600 MG 12hr tablet Take 1 tablet by mouth 2 (two) times daily as needed for cough. 20 tablet 3  . ezetimibe (ZETIA) 10 MG tablet Take 1 tablet (10 mg total) by mouth daily. 90 tablet 1  . Fluticasone-Salmeterol (ADVAIR DISKUS) 250-50 MCG/DOSE AEPB Inhale 1 puff into the lungs 2 (two) times daily. 60 each 3  .  furosemide (LASIX) 40 MG tablet Take 1 tablet (40 mg total) by mouth daily. 90 tablet 1  . guaiFENesin (MUCINEX) 600 MG 12 hr tablet Take 1 tablet (600 mg total) by mouth 2 (two) times daily. 20 tablet 0  . ipratropium-albuterol (DUONEB) 0.5-2.5 (3) MG/3ML SOLN Take 3 mLs by nebulization 2 (two) times daily. 360 mL 2  . metoprolol tartrate (LOPRESSOR) 25 MG tablet Take 1 tablet (25 mg total) by mouth 2 (two) times daily. 60 tablet 1  . mirtazapine (REMERON) 7.5 MG tablet Take 1 tablet (7.5 mg total) by mouth at bedtime. 30 tablet 2  . Nebulizers (COMPRESSOR/NEBULIZER) MISC 1 Units by Does not apply route 3 times/day as needed-between meals & bedtime. -Nebulizer machine with tubing supplies to be used for administering nebulized medications as prescribed for COPD 1 each 0  . NITROSTAT 0.4 MG SL tablet PLACE 1 TABLET UNDER THE TONGUE EVERY 5 MINUTES AS NEEDED FOR CHEST PA IN 25 tablet 0  . potassium chloride SA (KLOR-CON M20) 20 MEQ tablet Take 1 tablet (20 mEq total) by mouth 2 (two) times daily. 30 tablet 5  . sulfamethoxazole-trimethoprim (BACTRIM DS) 800-160 MG tablet Take 1 tablet by mouth 2 (two) times daily. 20 tablet 0   No facility-administered medications prior to visit.    No Known Allergies  Review of Systems  Constitutional: Negative.   HENT: Negative.   Respiratory: Negative.   Cardiovascular: Negative.   Gastrointestinal: Negative.   Musculoskeletal: Positive for joint swelling.  Skin: Positive for color change.       Objective:    Physical Exam Constitutional:      Appearance: Normal appearance.  HENT:     Head: Normocephalic.  Eyes:     Conjunctiva/sclera: Conjunctivae normal.  Cardiovascular:     Rate and Rhythm: Normal rate and regular rhythm.     Pulses: Normal pulses.  Pulmonary:     Effort: Pulmonary effort is normal.     Breath sounds: Normal breath sounds.  Abdominal:     General: Bowel sounds are normal.  Musculoskeletal:        General: Tenderness  present.  Skin:    General: Skin is dry.  Neurological:     Mental Status: She is alert and oriented to person, place, and time.     BP 135/76   Pulse 83   Temp (!) 97.5 F (36.4 C) (Temporal)   Ht 5\' 4"  (1.626 m)   Wt 113 lb 3.2 oz (  51.3 kg)   BMI 19.43 kg/m  Wt Readings from Last 3 Encounters:  05/12/20 113 lb 3.2 oz (51.3 kg)  04/14/20 112 lb (50.8 kg)  03/25/20 112 lb 7 oz (51 kg)    Health Maintenance Due  Topic Date Due  . COVID-19 Vaccine (1) Never done  . INFLUENZA VACCINE  05/07/2020    There are no preventive care reminders to display for this patient.   Lab Results  Component Value Date   TSH 1.130 08/22/2014   Lab Results  Component Value Date   WBC 9.1 03/27/2020   HGB 8.9 (L) 03/27/2020   HCT 28.6 (L) 03/27/2020   MCV 95.7 03/27/2020   PLT 213 03/27/2020   Lab Results  Component Value Date   NA 140 03/27/2020   K 3.2 (L) 03/27/2020   CO2 26 03/27/2020   GLUCOSE 77 03/27/2020   BUN 24 (H) 03/27/2020   CREATININE 1.37 (H) 03/27/2020   BILITOT 1.8 (H) 03/25/2020   ALKPHOS 88 03/25/2020   AST 24 03/25/2020   ALT 18 03/25/2020   PROT 7.8 03/25/2020   ALBUMIN 3.3 (L) 03/25/2020   CALCIUM 8.2 (L) 03/27/2020   ANIONGAP 9 03/27/2020   Lab Results  Component Value Date   CHOL 104 08/21/2018   Lab Results  Component Value Date   HDL 54 08/21/2018   Lab Results  Component Value Date   LDLCALC 34 08/21/2018   Lab Results  Component Value Date   TRIG 80 08/21/2018   Lab Results  Component Value Date   CHOLHDL 1.9 08/21/2018   No results found for: HGBA1C     Assessment & Plan:   Problem List Items Addressed This Visit      Other   Pain of left hand - Primary    Patient is a 86 year old female who presents to clinic with left hand pain.  Pain started approximately 2 days prior.  Patient unable to remember what caused the pain.  She is reporting pain of 1-2 out of 10 from a pain scale of 0-10.  Patient has not used any medication  to relieve symptoms.  She describes pain as mild and intermittent. Advised patient to use Tylenol as needed, apply ice, rest arm.  And follow-up with worsening and unresolved symptoms. Education provided to patient with printed handouts given.          No orders of the defined types were placed in this encounter.    Ivy Lynn, NP

## 2020-05-12 NOTE — Assessment & Plan Note (Signed)
Patient is a 86 year old female who presents to clinic with left hand pain.  Pain started approximately 2 days prior.  Patient unable to remember what caused the pain.  She is reporting pain of 1-2 out of 10 from a pain scale of 0-10.  Patient has not used any medication to relieve symptoms.  She describes pain as mild and intermittent. Advised patient to use Tylenol as needed, apply ice, rest arm.  And follow-up with worsening and unresolved symptoms. Education provided to patient with printed handouts given.

## 2020-05-27 DIAGNOSIS — J189 Pneumonia, unspecified organism: Secondary | ICD-10-CM | POA: Diagnosis not present

## 2020-05-27 DIAGNOSIS — J690 Pneumonitis due to inhalation of food and vomit: Secondary | ICD-10-CM | POA: Diagnosis not present

## 2020-05-27 DIAGNOSIS — E860 Dehydration: Secondary | ICD-10-CM | POA: Diagnosis not present

## 2020-06-14 ENCOUNTER — Other Ambulatory Visit: Payer: Self-pay | Admitting: Nurse Practitioner

## 2020-06-14 ENCOUNTER — Other Ambulatory Visit: Payer: Self-pay | Admitting: Family Medicine

## 2020-06-14 DIAGNOSIS — R630 Anorexia: Secondary | ICD-10-CM

## 2020-06-14 DIAGNOSIS — I119 Hypertensive heart disease without heart failure: Secondary | ICD-10-CM

## 2020-06-14 DIAGNOSIS — E785 Hyperlipidemia, unspecified: Secondary | ICD-10-CM

## 2020-06-15 ENCOUNTER — Other Ambulatory Visit: Payer: Self-pay | Admitting: Nurse Practitioner

## 2020-06-15 ENCOUNTER — Other Ambulatory Visit: Payer: Self-pay | Admitting: *Deleted

## 2020-06-15 DIAGNOSIS — I119 Hypertensive heart disease without heart failure: Secondary | ICD-10-CM

## 2020-06-15 MED ORDER — APIXABAN 2.5 MG PO TABS: 2.5000 mg | ORAL_TABLET | Freq: Two times a day (BID) | ORAL | 1 refills | Status: DC

## 2020-06-16 ENCOUNTER — Other Ambulatory Visit: Payer: Self-pay | Admitting: Nurse Practitioner

## 2020-06-16 DIAGNOSIS — I2510 Atherosclerotic heart disease of native coronary artery without angina pectoris: Secondary | ICD-10-CM

## 2020-06-16 MED ORDER — POTASSIUM CHLORIDE CRYS ER 20 MEQ PO TBCR: EXTENDED_RELEASE_TABLET | ORAL | 0 refills | Status: DC

## 2020-06-16 NOTE — Addendum Note (Signed)
Addended by: Antonietta Barcelona D on: 06/16/2020 02:23 PM   Modules accepted: Orders

## 2020-06-16 NOTE — Telephone Encounter (Signed)
Refill failed. resent °

## 2020-06-23 ENCOUNTER — Telehealth: Payer: Self-pay | Admitting: Nurse Practitioner

## 2020-06-23 DIAGNOSIS — E785 Hyperlipidemia, unspecified: Secondary | ICD-10-CM

## 2020-06-23 MED ORDER — EZETIMIBE 10 MG PO TABS: 10.0000 mg | ORAL_TABLET | Freq: Every day | ORAL | 1 refills | Status: DC

## 2020-06-23 NOTE — Telephone Encounter (Signed)
°  Prescription Request  06/23/2020  What is the name of the medication or equipment? Ezetimibe  Have you contacted your pharmacy to request a refill? (if applicable) Yes  Which pharmacy would you like this sent to? [facility] called from [facility] stating that they have tried sending request for refill on this medicine a few times and have not received any responses on it.   Patient notified that their request is being sent to the clinical staff for review and that they should receive a response within 2 business days.

## 2020-06-23 NOTE — Telephone Encounter (Signed)
Sent!

## 2020-06-27 DIAGNOSIS — J690 Pneumonitis due to inhalation of food and vomit: Secondary | ICD-10-CM | POA: Diagnosis not present

## 2020-06-27 DIAGNOSIS — E860 Dehydration: Secondary | ICD-10-CM | POA: Diagnosis not present

## 2020-07-11 ENCOUNTER — Telehealth: Payer: Self-pay

## 2020-07-11 NOTE — Telephone Encounter (Signed)
  Prescription Request  07/11/2020  What is the name of the medication or equipment? Antibiotic  Have you contacted your pharmacy to request a refill? (if applicable) Yes  Which pharmacy would you like this sent to? [facility]  Pt is having a cellulitis flare up and needs refill sent in to [facility] for antibiotic.   Patient notified that their request is being sent to the clinical staff for review and that they should receive a response within 2 business days.

## 2020-07-11 NOTE — Telephone Encounter (Signed)
Spoke with patient, appointment scheduled for 07/12/2020 scheduled for video visit with Hendricks Limes.

## 2020-07-12 ENCOUNTER — Encounter: Payer: Self-pay | Admitting: Family Medicine

## 2020-07-12 ENCOUNTER — Telehealth (INDEPENDENT_AMBULATORY_CARE_PROVIDER_SITE_OTHER): Payer: Medicare Other | Admitting: Family Medicine

## 2020-07-12 DIAGNOSIS — M109 Gout, unspecified: Secondary | ICD-10-CM | POA: Diagnosis not present

## 2020-07-12 MED ORDER — PREDNISONE 10 MG (21) PO TBPK: ORAL_TABLET | ORAL | 0 refills | Status: DC

## 2020-07-12 NOTE — Patient Instructions (Signed)
 Gout  Gout is painful swelling of your joints. Gout is a type of arthritis. It is caused by having too much uric acid in your body. Uric acid is a chemical that is made when your body breaks down substances called purines. If your body has too much uric acid, sharp crystals can form and build up in your joints. This causes pain and swelling. Gout attacks can happen quickly and be very painful (acute gout). Over time, the attacks can affect more joints and happen more often (chronic gout). What are the causes?  Too much uric acid in your blood. This can happen because: ? Your kidneys do not remove enough uric acid from your blood. ? Your body makes too much uric acid. ? You eat too many foods that are high in purines. These foods include organ meats, some seafood, and beer.  Trauma or stress. What increases the risk?  Having a family history of gout.  Being female and middle-aged.  Being female and having gone through menopause.  Being very overweight (obese).  Drinking alcohol, especially beer.  Not having enough water in the body (being dehydrated).  Losing weight too quickly.  Having an organ transplant.  Having lead poisoning.  Taking certain medicines.  Having kidney disease.  Having a skin condition called psoriasis. What are the signs or symptoms? An attack of acute gout usually happens in just one joint. The most common place is the big toe. Attacks often start at night. Other joints that may be affected include joints of the feet, ankle, knee, fingers, wrist, or elbow. Symptoms of an attack may include:  Very bad pain.  Warmth.  Swelling.  Stiffness.  Shiny, red, or purple skin.  Tenderness. The affected joint may be very painful to touch.  Chills and fever. Chronic gout may cause symptoms more often. More joints may be involved. You may also have white or yellow lumps (tophi) on your hands or feet or in other areas near your joints. How is this  treated?  Treatment for this condition has two phases: treating an acute attack and preventing future attacks.  Acute gout treatment may include: ? NSAIDs. ? Steroids. These are taken by mouth or injected into a joint. ? Colchicine. This medicine relieves pain and swelling. It can be given by mouth or through an IV tube.  Preventive treatment may include: ? Taking small doses of NSAIDs or colchicine daily. ? Using a medicine that reduces uric acid levels in your blood. ? Making changes to your diet. You may need to see a food expert (dietitian) about what to eat and drink to prevent gout. Follow these instructions at home: During a gout attack   If told, put ice on the painful area: ? Put ice in a plastic bag. ? Place a towel between your skin and the bag. ? Leave the ice on for 20 minutes, 2-3 times a day.  Raise (elevate) the painful joint above the level of your heart as often as you can.  Rest the joint as much as possible. If the joint is in your leg, you may be given crutches.  Follow instructions from your doctor about what you cannot eat or drink. Avoiding future gout attacks  Eat a low-purine diet. Avoid foods and drinks such as: ? Liver. ? Kidney. ? Anchovies. ? Asparagus. ? Herring. ? Mushrooms. ? Mussels. ? Beer.  Stay at a healthy weight. If you want to lose weight, talk with your doctor. Do not lose   weight too fast.  Start or continue an exercise plan as told by your doctor. Eating and drinking  Drink enough fluids to keep your pee (urine) pale yellow.  If you drink alcohol: ? Limit how much you use to:  0-1 drink a day for women.  0-2 drinks a day for men. ? Be aware of how much alcohol is in your drink. In the [place] one drink equals one 12 oz bottle of beer (355 mL), one 5 oz glass of wine (148 mL), or one 1 oz glass of hard liquor (44 mL). General instructions  Take over-the-counter and prescription medicines only as told by your doctor.  Do  not drive or use heavy machinery while taking prescription pain medicine.  Return to your normal activities as told by your doctor. Ask your doctor what activities are safe for you.  Keep all follow-up visits as told by your doctor. This is important. Contact a doctor if:  You have another gout attack.  You still have symptoms of a gout attack after 10 days of treatment.  You have problems (side effects) because of your medicines.  You have chills or a fever.  You have burning pain when you pee (urinate).  You have pain in your lower back or belly. Get help right away if:  You have very bad pain.  Your pain cannot be controlled.  You cannot pee. Summary  Gout is painful swelling of the joints.  The most common site of pain is the big toe, but it can affect other joints.  Medicines and avoiding some foods can help to prevent and treat gout attacks. This information is not intended to replace advice given to you by your health care provider. Make sure you discuss any questions you have with your health care provider. Document Revised: 04/15/2018 Document Reviewed: 04/15/2018 Elsevier Patient Education  2020 [employer] Inc[employer]  

## 2020-07-12 NOTE — Progress Notes (Signed)
Virtual Visit via Telephone Note  I connected with Laura Jacobs on 07/12/20 at 12:25 PM by telephone and verified that I am speaking with the correct person using two identifiers. Laura Jacobs is currently located at home and her granddaughter and son are currently with her during this visit. The provider, Loman Brooklyn, FNP is located in their home at time of visit.  I discussed the limitations, risks, security and privacy concerns of performing an evaluation and management service by telephone and the availability of in person appointments. I also discussed with the patient that there may be a patient responsible charge related to this service. The patient expressed understanding and agreed to proceed.  Subjective: PCP: [name], FNP  Chief Complaint  Patient presents with  . Cellulitis   Patient complains of tenderness, swelling, erythema, and warmth to the knuckles of her middle ring and pinky fingers on her right hand.  Symptoms started 2 days ago and have been worsening.  She has no open areas of her hand.  Denies any injury.  She does have a history of gout and is not on a prophylactic medication.   ROS: Per HPI  Current Outpatient Medications:  .  albuterol (PROVENTIL) (2.5 MG/3ML) 0.083% nebulizer solution, Take 3 mLs (2.5 mg total) by nebulization every 2 (two) hours as needed for wheezing or shortness of breath., Disp: 75 mL, Rfl: 12 .  albuterol (VENTOLIN HFA) 108 (90 Base) MCG/ACT inhaler, Inhale 2 puffs into the lungs every 6 (six) hours as needed., Disp: 18 g, Rfl: 2 .  apixaban (ELIQUIS) 2.5 MG TABS tablet, Take 1 tablet (2.5 mg total) by mouth 2 (two) times daily., Disp: 60 tablet, Rfl: 1 .  atorvastatin (LIPITOR) 40 MG tablet, TAKE 1 TABLET ONCE DAILY IN THE EVENING, Disp: 90 tablet, Rfl: 0 .  calcium-vitamin D (OSCAL 500/200 D-3) 500-200 MG-UNIT per tablet, Take 1 tablet by mouth 2 (two) times daily., Disp: , Rfl:  .  citalopram (CELEXA)  20 MG tablet, Take 1 tablet (20 mg total) by mouth daily., Disp: 30 tablet, Rfl: 5 .  dextromethorphan-guaiFENesin (MUCINEX DM) 30-600 MG 12hr tablet, Take 1 tablet by mouth 2 (two) times daily as needed for cough., Disp: 20 tablet, Rfl: 3 .  ezetimibe (ZETIA) 10 MG tablet, Take 1 tablet (10 mg total) by mouth daily., Disp: 90 tablet, Rfl: 1 .  Fluticasone-Salmeterol (ADVAIR DISKUS) 250-50 MCG/DOSE AEPB, Inhale 1 puff into the lungs 2 (two) times daily., Disp: 60 each, Rfl: 3 .  furosemide (LASIX) 40 MG tablet, Take 1 tablet (40 mg total) by mouth daily., Disp: 90 tablet, Rfl: 1 .  guaiFENesin (MUCINEX) 600 MG 12 hr tablet, Take 1 tablet (600 mg total) by mouth 2 (two) times daily., Disp: 20 tablet, Rfl: 0 .  ipratropium-albuterol (DUONEB) 0.5-2.5 (3) MG/3ML SOLN, Take 3 mLs by nebulization 2 (two) times daily., Disp: 360 mL, Rfl: 2 .  metoprolol tartrate (LOPRESSOR) 25 MG tablet, Take 1 tablet (25 mg total) by mouth 2 (two) times daily., Disp: 60 tablet, Rfl: 1 .  mirtazapine (REMERON) 7.5 MG tablet, TAKE 1 TABLET AT BEDTIME, Disp: 30 tablet, Rfl: 0 .  Nebulizers (COMPRESSOR/NEBULIZER) MISC, 1 Units by Does not apply route 3 times/day as needed-between meals & bedtime. -Nebulizer machine with tubing supplies to be used for administering nebulized medications as prescribed for COPD, Disp: 1 each, Rfl: 0 .  NITROSTAT 0.4 MG SL tablet, PLACE 1 TABLET UNDER THE TONGUE EVERY 5 MINUTES AS NEEDED  FOR CHEST PA IN, Disp: 25 tablet, Rfl: 0 .  potassium chloride SA (KLOR-CON) 20 MEQ tablet, TAKE  (1)  TABLET TWICE A DAY., Disp: 180 tablet, Rfl: 0 .  sulfamethoxazole-trimethoprim (BACTRIM DS) 800-160 MG tablet, Take 1 tablet by mouth 2 (two) times daily., Disp: 20 tablet, Rfl: 0  No Known Allergies Past Medical History:  Diagnosis Date  . Acute MI ([place])   . Atrial fibrillation, unspecified   . CAD (coronary artery disease)   . Gout   . Hyperlipidemia   . Hypertension     Observations/Objective: A&O    No respiratory distress or wheezing audible over the phone Mood, judgement, and thought processes all WNL   Assessment and Plan: 1. Acute gout of right hand, unspecified cause - Education provided via MyChart on gout so that foods can be adjusted.  Recommended uric acid level outside of flare. - predniSONE (STERAPRED UNI-PAK 21 TAB) 10 MG (21) TBPK tablet; As directed x 6 days  Dispense: 21 tablet; Refill: 0 - Uric acid; Future   Follow Up Instructions: Return as soon as PCP has opening, for follow-up of chronic medication conditions.  I discussed the assessment and treatment plan with the patient. The patient was provided an opportunity to ask questions and all were answered. The patient agreed with the plan and demonstrated an understanding of the instructions.   The patient was advised to call back or seek an in-person evaluation if the symptoms worsen or if the condition fails to improve as anticipated.  The above assessment and management plan was discussed with the patient. The patient verbalized understanding of and has agreed to the management plan. Patient is aware to call the clinic if symptoms persist or worsen. Patient is aware when to return to the clinic for a follow-up visit. Patient educated on when it is appropriate to go to the emergency department.   Time call ended: 12:40 PM  I provided 17 minutes of non-face-to-face time during this encounter.  Hendricks Limes, MSN, APRN, FNP-C [facility] 07/12/20

## 2020-07-15 ENCOUNTER — Other Ambulatory Visit: Payer: Self-pay | Admitting: Family Medicine

## 2020-07-15 ENCOUNTER — Other Ambulatory Visit: Payer: Self-pay | Admitting: Nurse Practitioner

## 2020-07-15 DIAGNOSIS — R630 Anorexia: Secondary | ICD-10-CM

## 2020-07-15 DIAGNOSIS — R000 Tachycardia, unspecified: Secondary | ICD-10-CM

## 2020-07-27 DIAGNOSIS — J690 Pneumonitis due to inhalation of food and vomit: Secondary | ICD-10-CM | POA: Diagnosis not present

## 2020-07-27 DIAGNOSIS — E860 Dehydration: Secondary | ICD-10-CM | POA: Diagnosis not present

## 2020-07-28 IMAGING — DX DG CHEST 1V PORT
1 series · 1 of 1 positions shown · non-contrast
Comparison: Chest radiograph 03/02/2020, 02/09/2020. lung bases
from abdominal CT 02/09/2020

CLINICAL DATA: Productive cough.

EXAM:
PORTABLE CHEST 1 VIEW

[chest ap]
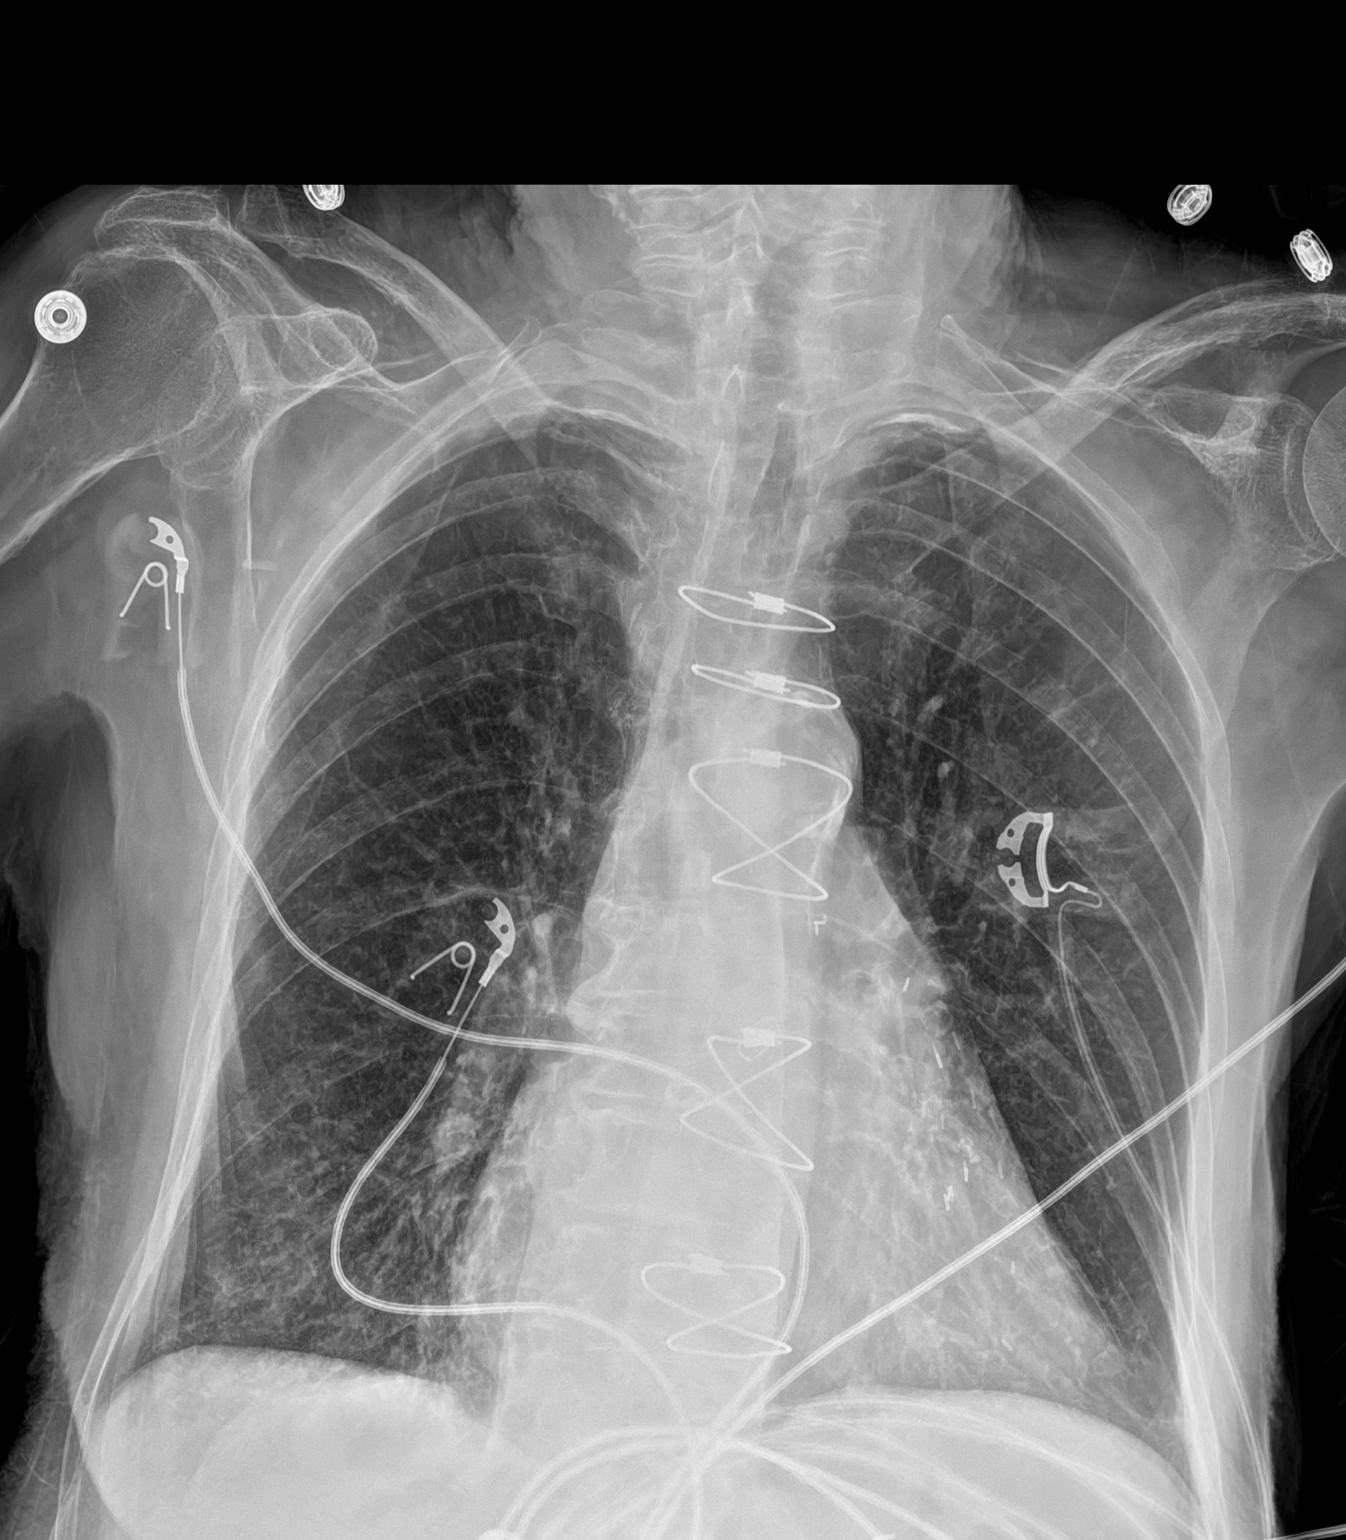

[1 of 1 positions shown; findings below may reference images not displayed]

FINDINGS: Post median sternotomy and CABG. Mild cardiomegaly with aortic
atherosclerosis. Chronic hyperinflation. Worsening patchy bibasilar
airspace disease. No pleural fluid or pneumothorax. No pulmonary
edema. Stable osseous structures.
IMPRESSION: 1. Worsening patchy bibasilar airspace disease, may represent
pneumonia or aspiration. This appears similar to 02/09/2020 imaging,
with interval clearing on intervening the 03/02/2020 chest
radiograph.
2. Chronic hyperinflation.
3. Mild cardiomegaly.  Post CABG.

Aortic Atherosclerosis ([id number]).

## 2020-08-11 ENCOUNTER — Ambulatory Visit: Payer: Medicare Other | Admitting: Nurse Practitioner

## 2020-08-16 ENCOUNTER — Other Ambulatory Visit: Payer: Self-pay | Admitting: Nurse Practitioner

## 2020-08-16 DIAGNOSIS — R000 Tachycardia, unspecified: Secondary | ICD-10-CM

## 2020-08-16 DIAGNOSIS — R630 Anorexia: Secondary | ICD-10-CM

## 2020-08-27 DIAGNOSIS — E860 Dehydration: Secondary | ICD-10-CM | POA: Diagnosis not present

## 2020-08-27 DIAGNOSIS — J690 Pneumonitis due to inhalation of food and vomit: Secondary | ICD-10-CM | POA: Diagnosis not present

## 2020-08-29 ENCOUNTER — Other Ambulatory Visit: Payer: Self-pay

## 2020-08-29 ENCOUNTER — Ambulatory Visit (INDEPENDENT_AMBULATORY_CARE_PROVIDER_SITE_OTHER): Payer: Medicare Other | Admitting: Nurse Practitioner

## 2020-08-29 ENCOUNTER — Encounter: Payer: Self-pay | Admitting: Nurse Practitioner

## 2020-08-29 ENCOUNTER — Ambulatory Visit (INDEPENDENT_AMBULATORY_CARE_PROVIDER_SITE_OTHER): Payer: Medicare Other

## 2020-08-29 VITALS — BP 140/93 | HR 112 | Temp 97.0°F | Resp 20 | Ht 64.0 in | Wt 130.0 lb

## 2020-08-29 DIAGNOSIS — M810 Age-related osteoporosis without current pathological fracture: Secondary | ICD-10-CM | POA: Diagnosis not present

## 2020-08-29 DIAGNOSIS — M1A9XX0 Chronic gout, unspecified, without tophus (tophi): Secondary | ICD-10-CM

## 2020-08-29 DIAGNOSIS — I5022 Chronic systolic (congestive) heart failure: Secondary | ICD-10-CM

## 2020-08-29 DIAGNOSIS — I2510 Atherosclerotic heart disease of native coronary artery without angina pectoris: Secondary | ICD-10-CM

## 2020-08-29 DIAGNOSIS — E785 Hyperlipidemia, unspecified: Secondary | ICD-10-CM

## 2020-08-29 DIAGNOSIS — E782 Mixed hyperlipidemia: Secondary | ICD-10-CM

## 2020-08-29 DIAGNOSIS — I119 Hypertensive heart disease without heart failure: Secondary | ICD-10-CM

## 2020-08-29 DIAGNOSIS — Z23 Encounter for immunization: Secondary | ICD-10-CM | POA: Diagnosis not present

## 2020-08-29 DIAGNOSIS — R000 Tachycardia, unspecified: Secondary | ICD-10-CM

## 2020-08-29 DIAGNOSIS — F411 Generalized anxiety disorder: Secondary | ICD-10-CM

## 2020-08-29 DIAGNOSIS — R630 Anorexia: Secondary | ICD-10-CM

## 2020-08-29 MED ORDER — PREDNISONE 20 MG PO TABS: ORAL_TABLET | ORAL | 0 refills | Status: DC

## 2020-08-29 MED ORDER — FUROSEMIDE 40 MG PO TABS: 40.0000 mg | ORAL_TABLET | Freq: Every day | ORAL | 1 refills | Status: DC

## 2020-08-29 MED ORDER — APIXABAN 2.5 MG PO TABS: 2.5000 mg | ORAL_TABLET | Freq: Two times a day (BID) | ORAL | 1 refills | Status: DC

## 2020-08-29 MED ORDER — METOPROLOL TARTRATE 25 MG PO TABS: ORAL_TABLET | ORAL | 1 refills | Status: DC

## 2020-08-29 MED ORDER — POTASSIUM CHLORIDE CRYS ER 20 MEQ PO TBCR: EXTENDED_RELEASE_TABLET | ORAL | 1 refills | Status: DC

## 2020-08-29 MED ORDER — ATORVASTATIN CALCIUM 40 MG PO TABS: ORAL_TABLET | ORAL | 1 refills | Status: DC

## 2020-08-29 MED ORDER — CITALOPRAM HYDROBROMIDE 20 MG PO TABS: 20.0000 mg | ORAL_TABLET | Freq: Every day | ORAL | 1 refills | Status: DC

## 2020-08-29 MED ORDER — EZETIMIBE 10 MG PO TABS: 10.0000 mg | ORAL_TABLET | Freq: Every day | ORAL | 1 refills | Status: DC

## 2020-08-29 MED ORDER — MIRTAZAPINE 7.5 MG PO TABS: 7.5000 mg | ORAL_TABLET | Freq: Every day | ORAL | 1 refills | Status: DC

## 2020-08-29 NOTE — Patient Instructions (Signed)
Health Maintenance After Age 84 After age 84, you are at a higher risk for certain long-term diseases and infections as well as injuries from falls. Falls are a major cause of broken bones and head injuries in people who are older than age 84. Getting regular preventive care can help to keep you healthy and well. Preventive care includes getting regular testing and making lifestyle changes as recommended by your health care provider. Talk with your health care provider about:  Which screenings and tests you should have. A screening is a test that checks for a disease when you have no symptoms.  A diet and exercise plan that is right for you. What should I know about screenings and tests to prevent falls? Screening and testing are the best ways to find a health problem early. Early diagnosis and treatment give you the best chance of managing medical conditions that are common after age 84. Certain conditions and lifestyle choices may make you more likely to have a fall. Your health care provider may recommend:  Regular vision checks. Poor vision and conditions such as cataracts can make you more likely to have a fall. If you wear glasses, make sure to get your prescription updated if your vision changes.  Medicine review. Work with your health care provider to regularly review all of the medicines you are taking, including over-the-counter medicines. Ask your health care provider about any side effects that may make you more likely to have a fall. Tell your health care provider if any medicines that you take make you feel dizzy or sleepy.  Osteoporosis screening. Osteoporosis is a condition that causes the bones to get weaker. This can make the bones weak and cause them to break more easily.  Blood pressure screening. Blood pressure changes and medicines to control blood pressure can make you feel dizzy.  Strength and balance checks. Your health care provider may recommend certain tests to check your  strength and balance while standing, walking, or changing positions.  Foot health exam. Foot pain and numbness, as well as not wearing proper footwear, can make you more likely to have a fall.  Depression screening. You may be more likely to have a fall if you have a fear of falling, feel emotionally low, or feel unable to do activities that you used to do.  Alcohol use screening. Using too much alcohol can affect your balance and may make you more likely to have a fall. What actions can I take to lower my risk of falls? General instructions  Talk with your health care provider about your risks for falling. Tell your health care provider if: ? You fall. Be sure to tell your health care provider about all falls, even ones that seem minor. ? You feel dizzy, sleepy, or off-balance.  Take over-the-counter and prescription medicines only as told by your health care provider. These include any supplements.  Eat a healthy diet and maintain a healthy weight. A healthy diet includes low-fat dairy products, low-fat (lean) meats, and fiber from whole grains, beans, and lots of fruits and vegetables. Home safety  Remove any tripping hazards, such as rugs, cords, and clutter.  Install safety equipment such as grab bars in bathrooms and safety rails on stairs.  Keep rooms and walkways well-lit. Activity   Follow a regular exercise program to stay fit. This will help you maintain your balance. Ask your health care provider what types of exercise are appropriate for you.  If you need a cane or   walker, use it as recommended by your health care provider.  Wear supportive shoes that have nonskid soles. Lifestyle  Do not drink alcohol if your health care provider tells you not to drink.  If you drink alcohol, limit how much you have: ? 0-1 drink a day for women. ? 0-2 drinks a day for men.  Be aware of how much alcohol is in your drink. In the [place] one drink equals one typical bottle of beer (12  oz), one-half glass of wine (5 oz), or one shot of hard liquor (1 oz).  Do not use any products that contain nicotine or tobacco, such as cigarettes and e-cigarettes. If you need help quitting, ask your health care provider. Summary  Having a healthy lifestyle and getting preventive care can help to protect your health and wellness after age 84.  Screening and testing are the best way to find a health problem early and help you avoid having a fall. Early diagnosis and treatment give you the best chance for managing medical conditions that are more common for people who are older than age 84.  Falls are a major cause of broken bones and head injuries in people who are older than age 84. Take precautions to prevent a fall at home.  Work with your health care provider to learn what changes you can make to improve your health and wellness and to prevent falls. This information is not intended to replace advice given to you by your health care provider. Make sure you discuss any questions you have with your health care provider. Document Revised: 01/14/2019 Document Reviewed: 08/06/2017 Elsevier Patient Education  2020 [employer]vier Inc.  

## 2020-08-29 NOTE — Progress Notes (Signed)
Subjective:    Patient ID: Darel Hong, female    DOB: 06-24-1934, 86 y.o.   MRN: [id number]   Chief Complaint: Medical Management of Chronic Issues    HPI:  1. Chronic systolic CHF (congestive heart failure) (HCC) BP Readings from Last 3 Encounters:  08/29/20 (!) 140/93  05/12/20 135/76  04/14/20 116/77  Check blood pressure sometimes at home. Takes medication as prescribed. Denies chest pain, swelling, or increased SOB. Recent pneumonia dx and has residual cough.     2. Age related osteoporosis, unspecified pathological fracture presence Takes medication as prescribed. Last bone density in 2016.  3. Mixed hyperlipidemia Lab Results  Component Value Date   CHOL 104 08/21/2018   HDL 54 08/21/2018   LDLCALC 34 08/21/2018   TRIG 80 08/21/2018   CHOLHDL 1.9 08/21/2018  Takes medication as prescribed. Does not watch diet.    4. Chronic gout without tophus, unspecified cause, unspecified site States left foot swelling with a throbbing pain in center on anterior aspect. States there has been associated swelling.      Outpatient Encounter Medications as of 08/29/2020  Medication Sig  . albuterol (PROVENTIL) (2.5 MG/3ML) 0.083% nebulizer solution Take 3 mLs (2.5 mg total) by nebulization every 2 (two) hours as needed for wheezing or shortness of breath.  Marland Kitchen albuterol (VENTOLIN HFA) 108 (90 Base) MCG/ACT inhaler Inhale 2 puffs into the lungs every 6 (six) hours as needed.  Marland Kitchen atorvastatin (LIPITOR) 40 MG tablet TAKE 1 TABLET ONCE DAILY IN THE EVENING  . calcium-vitamin D (OSCAL 500/200 D-3) 500-200 MG-UNIT per tablet Take 1 tablet by mouth 2 (two) times daily.  . citalopram (CELEXA) 20 MG tablet Take 1 tablet (20 mg total) by mouth daily.  Marland Kitchen dextromethorphan-guaiFENesin (MUCINEX DM) 30-600 MG 12hr tablet Take 1 tablet by mouth 2 (two) times daily as needed for cough.  Marland Kitchen ELIQUIS 2.5 MG TABS tablet TAKE 1 TABLET 2 TIMES A DAY  . ezetimibe (ZETIA) 10 MG tablet Take 1  tablet (10 mg total) by mouth daily.  . Fluticasone-Salmeterol (ADVAIR DISKUS) 250-50 MCG/DOSE AEPB Inhale 1 puff into the lungs 2 (two) times daily.  . furosemide (LASIX) 40 MG tablet Take 1 tablet (40 mg total) by mouth daily.  Marland Kitchen guaiFENesin (MUCINEX) 600 MG 12 hr tablet Take 1 tablet (600 mg total) by mouth 2 (two) times daily.  Marland Kitchen ipratropium-albuterol (DUONEB) 0.5-2.5 (3) MG/3ML SOLN Take 3 mLs by nebulization 2 (two) times daily.  . metoprolol tartrate (LOPRESSOR) 25 MG tablet TAKE  (1)  TABLET TWICE A DAY.  . mirtazapine (REMERON) 7.5 MG tablet TAKE 1 TABLET AT BEDTIME  . Nebulizers (COMPRESSOR/NEBULIZER) MISC 1 Units by Does not apply route 3 times/day as needed-between meals & bedtime. -Nebulizer machine with tubing supplies to be used for administering nebulized medications as prescribed for COPD  . NITROSTAT 0.4 MG SL tablet PLACE 1 TABLET UNDER THE TONGUE EVERY 5 MINUTES AS NEEDED FOR CHEST PA IN  . potassium chloride SA (KLOR-CON) 20 MEQ tablet TAKE  (1)  TABLET TWICE A DAY.  Marland Kitchen predniSONE (STERAPRED UNI-PAK 21 TAB) 10 MG (21) TBPK tablet As directed x 6 days    Past Surgical History:  Procedure Laterality Date  . heart by pass      Family History  Problem Relation Age of Onset  . Heart disease Mother   . Alcohol abuse Father   . Alzheimer's disease Sister   . Cancer Sister     New complaints: Left foot pain  x 3-4 days, has hx of "same feeling" in hand 1 month ago. Throbbing pain.   Social history: Lives with granddaughter and 2 sons. Plays with granddaughter's son. Works in garden, Civil engineer, contracting.   Controlled substance contract: n/a   Review of Systems  Constitutional: Negative.   Eyes: Negative.   Respiratory: Negative.   Cardiovascular: Negative.   Gastrointestinal: Negative.   Endocrine: Negative.   Genitourinary: Negative.   Musculoskeletal: Positive for joint swelling.       Left foot pain/swelling  Skin: Negative.   Allergic/Immunologic: Negative.     Neurological: Negative.   Hematological: Negative.   Psychiatric/Behavioral: Negative.   All other systems reviewed and are negative.      Objective:   Physical Exam Vitals and nursing note reviewed.  Constitutional:      Appearance: Normal appearance.  HENT:     Head: Normocephalic and atraumatic.     Right Ear: Tympanic membrane, ear canal and external ear normal.     Left Ear: Tympanic membrane, ear canal and external ear normal.     Nose: Nose normal.     Mouth/Throat:     Mouth: Mucous membranes are moist.     Pharynx: Oropharynx is clear.  Eyes:     Extraocular Movements: Extraocular movements intact.     Conjunctiva/sclera: Conjunctivae normal.     Pupils: Pupils are equal, round, and reactive to light.  Cardiovascular:     Rate and Rhythm: Regular rhythm. Tachycardia present.     Pulses: Normal pulses.     Heart sounds: Normal heart sounds.  Pulmonary:     Effort: Pulmonary effort is normal.     Breath sounds: Normal air entry. Examination of the left-middle field reveals rales. Examination of the left-lower field reveals rales. Rhonchi and rales present.  Abdominal:     General: Abdomen is flat. Bowel sounds are normal.     Palpations: Abdomen is soft.  Musculoskeletal:        General: Normal range of motion.     Cervical back: Normal range of motion.  Skin:    General: Skin is warm and dry.     Capillary Refill: Capillary refill takes less than 2 seconds.  Neurological:     General: No focal deficit present.     Mental Status: She is alert and oriented to person, place, and time. Mental status is at baseline.  Psychiatric:        Mood and Affect: Mood normal.        Behavior: Behavior normal.        Thought Content: Thought content normal.        Judgment: Judgment normal.   BP (!) 140/93   Pulse (!) 112   Temp (!) 97 F (36.1 C) (Temporal)   Resp 20   Ht 5\' 4"  (1.626 m)   Wt 130 lb (59 kg)   SpO2 95%   BMI 22.31 kg/m       Assessment & Plan:   ALAYSSA FLINCHUM comes in today with chief complaint of Medical Management of Chronic Issues   Diagnosis and orders addressed:  1. Chronic systolic CHF (congestive heart failure) (HCC) Take medication as prescribed. Take blood pressure at home regularly. Avoid foods high in salt. Report any chest pain, shortness of breath, or swelling.   2. Age related osteoporosis, unspecified pathological fracture presence Bone density exam today.   3. Mixed hyperlipidemia Take medication as prescribed. Avoid foods that are high in fat and fried.  4. Chronic gout without tophus, unspecified cause, unspecified site Take medication as prescribed. Report any new or worsening symptoms.  Labs pending Prednisone 20mg  2 po daily for 5 days  Meds ordered this encounter  Medications  . potassium chloride SA (KLOR-CON) 20 MEQ tablet    Sig: TAKE  (1)  TABLET TWICE A DAY.    Dispense:  180 tablet    Refill:  1    Order Specific Question:   Supervising Provider    Answer:   Caryl Pina A [id number]  . mirtazapine (REMERON) 7.5 MG tablet    Sig: Take 1 tablet (7.5 mg total) by mouth at bedtime.    Dispense:  90 tablet    Refill:  1    Order Specific Question:   Supervising Provider    Answer:   Caryl Pina A [id number]  . citalopram (CELEXA) 20 MG tablet    Sig: Take 1 tablet (20 mg total) by mouth daily.    Dispense:  90 tablet    Refill:  1    Order Specific Question:   Supervising Provider    Answer:   Caryl Pina A [id number]  . ezetimibe (ZETIA) 10 MG tablet    Sig: Take 1 tablet (10 mg total) by mouth daily.    Dispense:  90 tablet    Refill:  1    Order Specific Question:   Supervising Provider    Answer:   Caryl Pina A [id number]  . atorvastatin (LIPITOR) 40 MG tablet    Sig: TAKE 1 TABLET ONCE DAILY IN THE EVENING    Dispense:  90 tablet    Refill:  1    Order Specific Question:   Supervising Provider    Answer:   Caryl Pina A [id number]  .  furosemide (LASIX) 40 MG tablet    Sig: Take 1 tablet (40 mg total) by mouth daily.    Dispense:  90 tablet    Refill:  1    Order Specific Question:   Supervising Provider    Answer:   Caryl Pina A [id number]  . metoprolol tartrate (LOPRESSOR) 25 MG tablet    Sig: TAKE  (1)  TABLET TWICE A DAY.    Dispense:  180 tablet    Refill:  1    Order Specific Question:   Supervising Provider    Answer:   Caryl Pina A [id number]  . apixaban (ELIQUIS) 2.5 MG TABS tablet    Sig: Take 1 tablet (2.5 mg total) by mouth 2 (two) times daily.    Dispense:  180 tablet    Refill:  1    Order Specific Question:   Supervising Provider    Answer:   Caryl Pina A [id number]    Labs pending Health Maintenance reviewed Diet and exercise encouraged  Follow up plan: Follow up in 6 months.    [name], FNP

## 2020-08-30 LAB — LIPID PANEL
Chol/HDL Ratio: 2.3 ratio (ref 0.0–4.4)
Cholesterol, Total: 120 mg/dL (ref 100–199)
HDL: 53 mg/dL (ref >39)
LDL Chol Calc (NIH): 53 mg/dL (ref 0–99)
Triglycerides: 69 mg/dL (ref 0–149)
VLDL Cholesterol Cal: 14 mg/dL (ref 5–40)

## 2020-08-30 LAB — CMP14+EGFR
ALT: 6 IU/L (ref 0–32)
AST: 8 IU/L (ref 0–40)
Albumin/Globulin Ratio: 1.4 (ref 1.2–2.2)
Albumin: 4.2 g/dL (ref 3.6–4.6)
Alkaline Phosphatase: 115 IU/L (ref 44–121)
BUN/Creatinine Ratio: 14 (ref 12–28)
BUN: 24 mg/dL (ref 8–27)
Bilirubin Total: 0.8 mg/dL (ref 0.0–1.2)
CO2: 24 mmol/L (ref 20–29)
Calcium: 9.2 mg/dL (ref 8.7–10.3)
Chloride: 102 mmol/L (ref 96–106)
Creatinine, Ser: 1.71 mg/dL — ABNORMAL HIGH (ref 0.57–1.00)
GFR calc Af Amer: 31 mL/min/{1.73_m2} — ABNORMAL LOW (ref >59)
GFR calc non Af Amer: 27 mL/min/{1.73_m2} — ABNORMAL LOW (ref >59)
Globulin, Total: 3.1 g/dL (ref 1.5–4.5)
Glucose: 86 mg/dL (ref 65–99)
Potassium: 4.2 mmol/L (ref 3.5–5.2)
Sodium: 142 mmol/L (ref 134–144)
Total Protein: 7.3 g/dL (ref 6.0–8.5)

## 2020-08-30 LAB — CBC WITH DIFFERENTIAL/PLATELET
Basophils Absolute: 0 10*3/uL (ref 0.0–0.2)
Basos: 0 %
EOS (ABSOLUTE): 0.1 10*3/uL (ref 0.0–0.4)
Eos: 1 %
Hematocrit: 32.3 % — ABNORMAL LOW (ref 34.0–46.6)
Hemoglobin: 11.1 g/dL (ref 11.1–15.9)
Immature Grans (Abs): 0 10*3/uL (ref 0.0–0.1)
Immature Granulocytes: 0 %
Lymphocytes Absolute: 1.9 10*3/uL (ref 0.7–3.1)
Lymphs: 20 %
MCH: 30.5 pg (ref 26.6–33.0)
MCHC: 34.4 g/dL (ref 31.5–35.7)
MCV: 89 fL (ref 79–97)
Monocytes Absolute: 1 10*3/uL — ABNORMAL HIGH (ref 0.1–0.9)
Monocytes: 11 %
Neutrophils Absolute: 6.1 10*3/uL (ref 1.4–7.0)
Neutrophils: 68 %
Platelets: 257 10*3/uL (ref 150–450)
RBC: 3.64 x10E6/uL — ABNORMAL LOW (ref 3.77–5.28)
RDW: 13.9 % (ref 11.7–15.4)
WBC: 9.1 10*3/uL (ref 3.4–10.8)

## 2020-08-30 LAB — URIC ACID: Uric Acid: 9.8 mg/dL — ABNORMAL HIGH (ref 3.1–7.9)

## 2020-09-26 DIAGNOSIS — J690 Pneumonitis due to inhalation of food and vomit: Secondary | ICD-10-CM | POA: Diagnosis not present

## 2020-09-26 DIAGNOSIS — E860 Dehydration: Secondary | ICD-10-CM | POA: Diagnosis not present

## 2020-10-03 ENCOUNTER — Other Ambulatory Visit: Payer: Self-pay | Admitting: Nurse Practitioner

## 2020-10-03 ENCOUNTER — Telehealth: Payer: Self-pay

## 2020-10-03 DIAGNOSIS — I5022 Chronic systolic (congestive) heart failure: Secondary | ICD-10-CM

## 2020-10-03 NOTE — Telephone Encounter (Signed)
LOV 08/29/2020 - please review and place referral if approved

## 2020-10-03 NOTE — Telephone Encounter (Signed)
REFERRAL REQUEST Telephone Note  Have you been seen at our office for this problem? Talked with MMM regarding this at last appt in Dec (Advise that they may need an appointment with their PCP before a referral can be done)  Reason for Referral: heart issues Referral discussed with patient: pt spoke with MMM Best contact number of patient for referral team: [phone]   Has patient been seen by a specialist for this issue before:  Yes, but they are retired Patient provider preference for referral: Would like to go to [facility]. Patient location preference for referral: [facility]   Patient notified that referrals can take up to a week or longer to process. If they haven't heard anything within a week they should call back and speak with the referral department.

## 2020-10-24 DIAGNOSIS — M79676 Pain in unspecified toe(s): Secondary | ICD-10-CM | POA: Diagnosis not present

## 2020-10-24 DIAGNOSIS — B351 Tinea unguium: Secondary | ICD-10-CM | POA: Diagnosis not present

## 2020-10-24 DIAGNOSIS — I70203 Unspecified atherosclerosis of native arteries of extremities, bilateral legs: Secondary | ICD-10-CM | POA: Diagnosis not present

## 2020-10-27 DIAGNOSIS — J690 Pneumonitis due to inhalation of food and vomit: Secondary | ICD-10-CM | POA: Diagnosis not present

## 2020-10-27 DIAGNOSIS — E860 Dehydration: Secondary | ICD-10-CM | POA: Diagnosis not present

## 2020-11-01 ENCOUNTER — Ambulatory Visit: Payer: Medicare Other | Admitting: Cardiology

## 2020-11-02 ENCOUNTER — Telehealth: Payer: Self-pay

## 2020-11-06 NOTE — Telephone Encounter (Signed)
R/c for nurse or MMM

## 2020-11-17 NOTE — Telephone Encounter (Signed)
Left detailed message and to call back with any further questions or concerns. 

## 2020-11-17 NOTE — Telephone Encounter (Signed)
Please let family know I have attempted to call several times with no response. Let them know that we do not grade CHF at our office, the cardiologist does that.

## 2020-11-21 ENCOUNTER — Encounter: Payer: Self-pay | Admitting: General Practice

## 2020-11-21 ENCOUNTER — Ambulatory Visit: Payer: Medicare Other | Admitting: Cardiology

## 2020-11-27 DIAGNOSIS — J690 Pneumonitis due to inhalation of food and vomit: Secondary | ICD-10-CM | POA: Diagnosis not present

## 2020-11-27 DIAGNOSIS — E860 Dehydration: Secondary | ICD-10-CM | POA: Diagnosis not present

## 2020-11-29 ENCOUNTER — Encounter: Payer: Self-pay | Admitting: Nurse Practitioner

## 2020-11-29 ENCOUNTER — Ambulatory Visit (INDEPENDENT_AMBULATORY_CARE_PROVIDER_SITE_OTHER): Payer: Medicare Other | Admitting: Nurse Practitioner

## 2020-11-29 ENCOUNTER — Other Ambulatory Visit: Payer: Self-pay

## 2020-11-29 ENCOUNTER — Ambulatory Visit (INDEPENDENT_AMBULATORY_CARE_PROVIDER_SITE_OTHER): Payer: Medicare Other

## 2020-11-29 VITALS — BP 128/81 | HR 81 | Temp 97.0°F | Resp 20 | Ht 64.0 in | Wt 129.0 lb

## 2020-11-29 DIAGNOSIS — I2510 Atherosclerotic heart disease of native coronary artery without angina pectoris: Secondary | ICD-10-CM | POA: Diagnosis not present

## 2020-11-29 DIAGNOSIS — R7989 Other specified abnormal findings of blood chemistry: Secondary | ICD-10-CM

## 2020-11-29 DIAGNOSIS — E782 Mixed hyperlipidemia: Secondary | ICD-10-CM

## 2020-11-29 DIAGNOSIS — I119 Hypertensive heart disease without heart failure: Secondary | ICD-10-CM | POA: Diagnosis not present

## 2020-11-29 DIAGNOSIS — R911 Solitary pulmonary nodule: Secondary | ICD-10-CM

## 2020-11-29 DIAGNOSIS — J411 Mucopurulent chronic bronchitis: Secondary | ICD-10-CM | POA: Diagnosis not present

## 2020-11-29 DIAGNOSIS — N184 Chronic kidney disease, stage 4 (severe): Secondary | ICD-10-CM | POA: Diagnosis not present

## 2020-11-29 DIAGNOSIS — M810 Age-related osteoporosis without current pathological fracture: Secondary | ICD-10-CM | POA: Diagnosis not present

## 2020-11-29 DIAGNOSIS — I5022 Chronic systolic (congestive) heart failure: Secondary | ICD-10-CM | POA: Diagnosis not present

## 2020-11-29 DIAGNOSIS — F411 Generalized anxiety disorder: Secondary | ICD-10-CM

## 2020-11-29 DIAGNOSIS — I480 Paroxysmal atrial fibrillation: Secondary | ICD-10-CM | POA: Diagnosis not present

## 2020-11-29 MED ORDER — FLUTICASONE-SALMETEROL 250-50 MCG/DOSE IN AEPB: 1.0000 | INHALATION_SPRAY | Freq: Two times a day (BID) | RESPIRATORY_TRACT | 3 refills | Status: DC

## 2020-11-29 MED ORDER — CITALOPRAM HYDROBROMIDE 20 MG PO TABS: 20.0000 mg | ORAL_TABLET | Freq: Every day | ORAL | 1 refills | Status: DC

## 2020-11-29 MED ORDER — APIXABAN 2.5 MG PO TABS: 2.5000 mg | ORAL_TABLET | Freq: Two times a day (BID) | ORAL | 1 refills | Status: DC

## 2020-11-29 MED ORDER — ATORVASTATIN CALCIUM 40 MG PO TABS: ORAL_TABLET | ORAL | 1 refills | Status: DC

## 2020-11-29 MED ORDER — POTASSIUM CHLORIDE CRYS ER 20 MEQ PO TBCR: EXTENDED_RELEASE_TABLET | ORAL | 1 refills | Status: DC

## 2020-11-29 MED ORDER — METOPROLOL TARTRATE 25 MG PO TABS: ORAL_TABLET | ORAL | 1 refills | Status: DC

## 2020-11-29 MED ORDER — FUROSEMIDE 40 MG PO TABS: 40.0000 mg | ORAL_TABLET | Freq: Every day | ORAL | 1 refills | Status: DC

## 2020-11-29 MED ORDER — NITROGLYCERIN 0.4 MG SL SUBL: SUBLINGUAL_TABLET | SUBLINGUAL | 0 refills | Status: AC

## 2020-11-29 MED ORDER — EZETIMIBE 10 MG PO TABS: 10.0000 mg | ORAL_TABLET | Freq: Every day | ORAL | 1 refills | Status: DC

## 2020-11-29 NOTE — Patient Instructions (Signed)

## 2020-11-29 NOTE — Progress Notes (Signed)
Subjective:    Patient ID: Laura Jacobs, female    DOB: July 23, 1934, 87 y.o.   MRN: [id number]   Chief Complaint: Medical Management of Chronic Issues (Not eating as good/)    HPI:  1. Hypertensive heart disease  No c/o chest pain, sob or headache. Does not check blood pressure at home. BP Readings from Last 3 Encounters:  11/29/20 128/81  08/29/20 (!) 140/93  05/12/20 135/76     2. Chronic systolic CHF (congestive heart failure) ([place]) She has an appointment with cardiologist coming up soon.   3. Paroxysmal atrial fibrillation (HCC) Denies any palpitations or heart racing. Is on eliquis daily with no bleeding.  4. Coronary artery disease involving native coronary artery of native heart without angina pectoris Has future appointment with cardiology  5. Mixed hyperlipidemia Eats waht ever her family brings her. Is not able to do much exercise.  6. Chronic kidney disease (CKD), stage IV (severe) ([place]) Has appointment with neprologist in a couple of months Lab Results  Component Value Date   CREATININE 1.71 (H) 08/29/2020     7. Age related osteoporosis, unspecified pathological fracture presence Last dexascan was done in 2016. Will repeat today  8. Low serum vitamin D Does takes caltrate daily  9. Lung nodule Last chest xray in 08/2020 - no nodule was seen.    Outpatient Encounter Medications as of 11/29/2020  Medication Sig  . albuterol (PROVENTIL) (2.5 MG/3ML) 0.083% nebulizer solution Take 3 mLs (2.5 mg total) by nebulization every 2 (two) hours as needed for wheezing or shortness of breath.  Marland Kitchen albuterol (VENTOLIN HFA) 108 (90 Base) MCG/ACT inhaler Inhale 2 puffs into the lungs every 6 (six) hours as needed.  Marland Kitchen apixaban (ELIQUIS) 2.5 MG TABS tablet Take 1 tablet (2.5 mg total) by mouth 2 (two) times daily.  Marland Kitchen atorvastatin (LIPITOR) 40 MG tablet TAKE 1 TABLET ONCE DAILY IN THE EVENING  . calcium-vitamin D (OSCAL 500/200 D-3) 500-200 MG-UNIT per tablet  Take 1 tablet by mouth 2 (two) times daily.  . citalopram (CELEXA) 20 MG tablet Take 1 tablet (20 mg total) by mouth daily.  Marland Kitchen ezetimibe (ZETIA) 10 MG tablet Take 1 tablet (10 mg total) by mouth daily.  . Fluticasone-Salmeterol (ADVAIR DISKUS) 250-50 MCG/DOSE AEPB Inhale 1 puff into the lungs 2 (two) times daily.  . furosemide (LASIX) 40 MG tablet Take 1 tablet (40 mg total) by mouth daily.  Marland Kitchen ipratropium-albuterol (DUONEB) 0.5-2.5 (3) MG/3ML SOLN Take 3 mLs by nebulization 2 (two) times daily.  . metoprolol tartrate (LOPRESSOR) 25 MG tablet TAKE  (1)  TABLET TWICE A DAY.  . mirtazapine (REMERON) 7.5 MG tablet Take 1 tablet (7.5 mg total) by mouth at bedtime.  . Nebulizers (COMPRESSOR/NEBULIZER) MISC 1 Units by Does not apply route 3 times/day as needed-between meals & bedtime. -Nebulizer machine with tubing supplies to be used for administering nebulized medications as prescribed for COPD  . NITROSTAT 0.4 MG SL tablet PLACE 1 TABLET UNDER THE TONGUE EVERY 5 MINUTES AS NEEDED FOR CHEST PA IN  . potassium chloride SA (KLOR-CON) 20 MEQ tablet TAKE  (1)  TABLET TWICE A DAY.     Past Surgical History:  Procedure Laterality Date  . heart by pass      Family History  Problem Relation Age of Onset  . Heart disease Mother   . Alcohol abuse Father   . Alzheimer's disease Sister   . Cancer Sister     New complaints: none  Social history:  son lives with her  Controlled substance contract: n/a    Review of Systems  Constitutional: Negative for diaphoresis.  Eyes: Negative for pain.  Respiratory: Negative for shortness of breath.   Cardiovascular: Negative for chest pain, palpitations and leg swelling.  Gastrointestinal: Negative for abdominal pain.  Endocrine: Negative for polydipsia.  Skin: Negative for rash.  Neurological: Negative for dizziness, weakness and headaches.  Hematological: Does not bruise/bleed easily.  All other systems reviewed and are negative.       Objective:   Physical Exam Vitals and nursing note reviewed.  Constitutional:      General: She is not in acute distress.    Appearance: Normal appearance. She is well-developed and well-nourished.  HENT:     Head: Normocephalic.     Nose: Nose normal.     Mouth/Throat:     Mouth: Oropharynx is clear and moist.  Eyes:     Extraocular Movements: EOM normal.     Pupils: Pupils are equal, round, and reactive to light.  Neck:     Vascular: No carotid bruit or JVD.  Cardiovascular:     Rate and Rhythm: Normal rate. Rhythm irregular.     Pulses: Intact distal pulses.     Heart sounds: Normal heart sounds.  Pulmonary:     Effort: Pulmonary effort is normal. No respiratory distress.     Breath sounds: Rhonchi (thorughout) present. No wheezing or rales.  Chest:     Chest wall: No tenderness.  Abdominal:     General: Bowel sounds are normal. There is no distension or abdominal bruit. Aorta is normal.     Palpations: Abdomen is soft. There is no hepatomegaly, splenomegaly, mass or pulsatile mass.     Tenderness: There is no abdominal tenderness.  Musculoskeletal:        General: No edema. Normal range of motion.     Cervical back: Normal range of motion and neck supple.     Comments: Rises slowly from sitting to standing  Lymphadenopathy:     Cervical: No cervical adenopathy.  Skin:    General: Skin is warm and dry.  Neurological:     Mental Status: She is alert and oriented to person, place, and time.     Deep Tendon Reflexes: Reflexes are normal and symmetric.  Psychiatric:        Mood and Affect: Mood and affect normal.        Behavior: Behavior normal.        Thought Content: Thought content normal.        Judgment: Judgment normal.    BP 128/81   Pulse 81   Temp (!) 97 F (36.1 C) (Temporal)   Resp 20   Ht _0  (1.626 m)   Wt 129 lb (58.5 kg)   SpO2 98%   BMI 22.14 kg/m         Assessment & Plan:  Laura Jacobs comes in today with chief complaint of  Medical Management of Chronic Issues (Not eating as good/)   Diagnosis and orders addressed:  1. Hypertensive heart disease  Low sodium diet reviewed - furosemide (LASIX) 40 MG tablet; Take 1 tablet (40 mg total) by mouth daily.  Dispense: 90 tablet; Refill: 1 - CBC with Differential/Platelet - CMP14+EGFR  2. Chronic systolic CHF (congestive heart failure) (HCC) Make appointmnet with cardiology - apixaban (ELIQUIS) 2.5 MG TABS tablet; Take 1 tablet (2.5 mg total) by mouth 2 (two) times daily.  Dispense: 180 tablet; Refill: 1  3. Paroxysmal atrial fibrillation (HCC) Needs to see cardiology - metoprolol tartrate (LOPRESSOR) 25 MG tablet; TAKE  (1)  TABLET TWICE A DAY.  Dispense: 180 tablet; Refill: 1  4. Coronary artery disease involving native coronary artery of native heart without angina pectoris - potassium chloride SA (KLOR-CON) 20 MEQ tablet; TAKE  (1)  TABLET TWICE A DAY.  Dispense: 180 tablet; Refill: 1  5. Mixed hyperlipidemia Low fat diet - ezetimibe (ZETIA) 10 MG tablet; Take 1 tablet (10 mg total) by mouth daily.  Dispense: 90 tablet; Refill: 1 - atorvastatin (LIPITOR) 40 MG tablet; TAKE 1 TABLET ONCE DAILY IN THE EVENING  Dispense: 90 tablet; Refill: 1 - Lipid panel  6. Chronic kidney disease (CKD), stage IV (severe) ([place]) Keep follow up with nephrology  7. Age related osteoporosis, unspecified pathological fracture presence Weight bearing exercises - DG [facility] DEXA  8. Low serum vitamin D Continue daily caltrate  9. Lung nodule  10. GAD (generalized anxiety disorder) Stress management - citalopram (CELEXA) 20 MG tablet; Take 1 tablet (20 mg total) by mouth daily.  Dispense: 90 tablet; Refill: 1  11. Mucopurulent chronic bronchitis (HCC) - Fluticasone-Salmeterol (ADVAIR DISKUS) 250-50 MCG/DOSE AEPB; Inhale 1 puff into the lungs 2 (two) times daily.  Dispense: 60 each; Refill: 3   Labs pending Health Maintenance reviewed Diet and exercise encouraged  Follow  up plan: 3 months   [name], FNP

## 2020-12-01 DIAGNOSIS — M810 Age-related osteoporosis without current pathological fracture: Secondary | ICD-10-CM | POA: Diagnosis not present

## 2020-12-01 DIAGNOSIS — Z780 Asymptomatic menopausal state: Secondary | ICD-10-CM | POA: Diagnosis not present

## 2020-12-01 DIAGNOSIS — M8588 Other specified disorders of bone density and structure, other site: Secondary | ICD-10-CM | POA: Diagnosis not present

## 2020-12-25 DIAGNOSIS — J690 Pneumonitis due to inhalation of food and vomit: Secondary | ICD-10-CM | POA: Diagnosis not present

## 2020-12-25 DIAGNOSIS — E860 Dehydration: Secondary | ICD-10-CM | POA: Diagnosis not present

## 2021-01-12 ENCOUNTER — Encounter: Payer: Self-pay | Admitting: Cardiology

## 2021-01-12 ENCOUNTER — Other Ambulatory Visit: Payer: Self-pay

## 2021-01-12 ENCOUNTER — Other Ambulatory Visit: Payer: Self-pay | Admitting: *Deleted

## 2021-01-12 ENCOUNTER — Ambulatory Visit (INDEPENDENT_AMBULATORY_CARE_PROVIDER_SITE_OTHER): Payer: Medicare Other | Admitting: Cardiology

## 2021-01-12 VITALS — BP 122/78 | HR 84 | Ht 64.0 in | Wt 130.8 lb

## 2021-01-12 DIAGNOSIS — I2510 Atherosclerotic heart disease of native coronary artery without angina pectoris: Secondary | ICD-10-CM

## 2021-01-12 DIAGNOSIS — R0602 Shortness of breath: Secondary | ICD-10-CM

## 2021-01-12 DIAGNOSIS — E7800 Pure hypercholesterolemia, unspecified: Secondary | ICD-10-CM | POA: Diagnosis not present

## 2021-01-12 DIAGNOSIS — I482 Chronic atrial fibrillation, unspecified: Secondary | ICD-10-CM | POA: Insufficient documentation

## 2021-01-12 DIAGNOSIS — I350 Nonrheumatic aortic (valve) stenosis: Secondary | ICD-10-CM | POA: Diagnosis not present

## 2021-01-12 DIAGNOSIS — I5022 Chronic systolic (congestive) heart failure: Secondary | ICD-10-CM

## 2021-01-12 DIAGNOSIS — I119 Hypertensive heart disease without heart failure: Secondary | ICD-10-CM

## 2021-01-12 DIAGNOSIS — I340 Nonrheumatic mitral (valve) insufficiency: Secondary | ICD-10-CM | POA: Diagnosis not present

## 2021-01-12 NOTE — Addendum Note (Signed)
Addended by: Thompson Grayer on: 01/12/2021 01:26 PM   Modules accepted: Orders

## 2021-01-12 NOTE — Progress Notes (Signed)
Cardiology Office Note    Date:  01/12/2021   ID:  Laura Jacobs, DOB [birth year], MRN [id number]  PCP:  [name], FNP  Cardiologist:  Fransico Him, MD   Chief Complaint  Patient presents with  . New Patient (Initial Visit)    CAD, HTN, HLD    History of Present Illness:  Laura Jacobs is a 87 y.o. female who is being seen today for the evaluation to establish cardiac care at the request of Hassell Done, Mary-Margaret, *.  This is an 87yo female with a hx of HTN, HLD, renal cysts, ASCAD s/p inferior MI 1998 with subsequent cath showing 50% stenosis ostial Left main, 60% stenosis proximal LAD, 90% stenosis mid LAD, occluded mid CFX, 70% stenosis proximal RCA, 80% stenosis ostial PDA, occluded OM 2 and underwent CABG w LIMA to D1-D2-LAD, SVG to OM-circ, SVG to PD-PL 05/23/97. 2D echo in 2015 showed low normal LVF with EF 50%.  She also has a hx of chronic atrial fibrillation on chronic anticoagulation, stage 3 CKD and HLD.  Her granddaughter is with her today and says that she has had frequent PNA and has COPD followed by her PCP.  She has chronic DOE that her granddaughter feels is worse but the patient thinks that it is worse.  She denies any chest pain or pressure, PND, orthopnea, LE edema, dizziness, palpitations or syncope. She has chronic DOE which she says is very stable and has no LE edema.  She is compliant with  meds and is tolerating meds with no SE.    Past Medical History:  Diagnosis Date  . CAD (coronary artery disease)    s/p inferior MI 1998 with subsequent cath showing 50% stenosis ostial Left main, 60% stenosis proximal LAD, 90% stenosis mid LAD, occluded mid CFX, 70% stenosis proximal RCA, 80% stenosis ostial PDA, occluded OM 2 and underwent CABG w LIMA to D1-D2-LAD, SVG to OM-circ, SVG to PD-PL 05/23/97.  Marland Kitchen Chronic atrial fibrillation ([place])   . Gout   . Hyperlipidemia   . Hypertension     Past Surgical History:  Procedure Laterality Date  .  heart by pass      Current Medications: Current Meds  Medication Sig  . albuterol (PROVENTIL) (2.5 MG/3ML) 0.083% nebulizer solution Take 3 mLs (2.5 mg total) by nebulization every 2 (two) hours as needed for wheezing or shortness of breath.  Marland Kitchen albuterol (VENTOLIN HFA) 108 (90 Base) MCG/ACT inhaler Inhale 2 puffs into the lungs every 6 (six) hours as needed.  Marland Kitchen apixaban (ELIQUIS) 2.5 MG TABS tablet Take 1 tablet (2.5 mg total) by mouth 2 (two) times daily.  Marland Kitchen atorvastatin (LIPITOR) 40 MG tablet TAKE 1 TABLET ONCE DAILY IN THE EVENING  . calcium-vitamin D (OSCAL 500/200 D-3) 500-200 MG-UNIT per tablet Take 1 tablet by mouth 2 (two) times daily.  . citalopram (CELEXA) 20 MG tablet Take 1 tablet (20 mg total) by mouth daily.  Marland Kitchen dextromethorphan-guaiFENesin (MUCINEX DM) 30-600 MG 12hr tablet Take 1 tablet by mouth as needed for cough.  . ezetimibe (ZETIA) 10 MG tablet Take 1 tablet (10 mg total) by mouth daily.  . Fluticasone-Salmeterol (ADVAIR DISKUS) 250-50 MCG/DOSE AEPB Inhale 1 puff into the lungs 2 (two) times daily.  . furosemide (LASIX) 40 MG tablet Take 1 tablet (40 mg total) by mouth daily.  Marland Kitchen ipratropium-albuterol (DUONEB) 0.5-2.5 (3) MG/3ML SOLN Take 3 mLs by nebulization 2 (two) times daily.  . metoprolol tartrate (LOPRESSOR) 25 MG tablet TAKE  (1)  TABLET TWICE A DAY.  . mirtazapine (REMERON) 7.5 MG tablet Take 1 tablet (7.5 mg total) by mouth at bedtime.  . Nebulizers (COMPRESSOR/NEBULIZER) MISC 1 Units by Does not apply route 3 times/day as needed-between meals & bedtime. -Nebulizer machine with tubing supplies to be used for administering nebulized medications as prescribed for COPD  . nitroGLYCERIN (NITROSTAT) 0.4 MG SL tablet PLACE 1 TABLET UNDER THE TONGUE EVERY 5 MINUTES AS NEEDED FOR CHEST PA IN  . potassium chloride SA (KLOR-CON) 20 MEQ tablet TAKE  (1)  TABLET TWICE A DAY.    Allergies:   Patient has no known allergies.   Social History   Socioeconomic History  .  Marital status: Widowed    Spouse name: Homer  . Number of children: 6  . Years of education: 8  . Highest education level: 8th grade  Occupational History  . Occupation: retired  Tobacco Use  . Smoking status: Former Smoker    Packs/day: 0.50    Years: 60.00    Pack years: 30.00    Types: Cigarettes    Quit date: 08/07/2014    Years since quitting: 6.4  . Smokeless tobacco: Former Systems developer    Quit date: 05/11/2014  Vaping Use  . Vaping Use: Never used  Substance and Sexual Activity  . Alcohol use: No  . Drug use: No  . Sexual activity: Yes    Birth control/protection: Post-menopausal  Other Topics Concern  . Not on file  Social History Narrative   Retired, widowed, lives with two sons and a granddaughter, enjoys gardening.    Social Determinants of Health   Financial Resource Strain: Not on file  Food Insecurity: Not on file  Transportation Needs: Not on file  Physical Activity: Not on file  Stress: Not on file  Social Connections: Not on file     Family History:  The patient's family history includes Alcohol abuse in her father; Alzheimer's disease in her sister; Cancer in her sister; Heart disease in her mother.   ROS:   Please see the history of present illness.    ROS All other systems reviewed and are negative.  No flowsheet data found.     PHYSICAL EXAM:   VS:  BP 122/78   Pulse 84   Ht 5\' 4"  (1.626 m)   Wt 130 lb 12.8 oz (59.3 kg)   SpO2 98%   BMI 22.45 kg/m    GEN: Well nourished, well developed, in no acute distress  HEENT: normal  Neck: no JVD, carotid bruits, or masses Cardiac: irregularly irregular; no murmurs, rubs, or gallops,no edema.  Intact distal pulses bilaterally.  Respiratory:  Scattered exp rhonchi GI: soft, nontender, nondistended, + BS MS: no deformity or atrophy  Skin: warm and dry, no rash Neuro:  Alert and Oriented x 3, Strength and sensation are intact Psych: euthymic mood, full affect  Wt Readings from Last 3 Encounters:   01/12/21 130 lb 12.8 oz (59.3 kg)  11/29/20 129 lb (58.5 kg)  08/29/20 130 lb (59 kg)      Studies/Labs Reviewed:   EKG:  EKG is ordered today.  The ekg ordered today demonstrates atrial fibrillation with CVR with nonspecific T wave abnormality  Recent Labs: 02/11/2020: Magnesium 1.7 08/29/2020: ALT 6; BUN 24; Creatinine, Ser 1.71; Hemoglobin 11.1; Platelets 257; Potassium 4.2; Sodium 142   Lipid Panel    Component Value Date/Time   CHOL 120 08/29/2020 1207   CHOL 124 02/04/2013 1525   TRIG 69 08/29/2020 1207  TRIG 82 02/23/2015 1414   TRIG 139 02/04/2013 1525   HDL 53 08/29/2020 1207   HDL 43 02/23/2015 1414   HDL 35 (L) 02/04/2013 1525   CHOLHDL 2.3 08/29/2020 1207   LDLCALC 53 08/29/2020 1207   LDLCALC 46 06/03/2014 1605   LDLCALC 61 02/04/2013 1525     CHA2DS2-VASc Score = 5  This indicates a 7.2% annual risk of stroke. The patient's score is based upon: CHF History: No HTN History: Yes Diabetes History: No Stroke History: No Vascular Disease History: Yes Age Score: 2 Gender Score: 1    Additional studies/ records that were reviewed today include:  OV notes from Dr. Wynonia Lawman    ASSESSMENT:    1. Coronary artery disease involving native coronary artery of native heart without angina pectoris   2. Chronic atrial fibrillation (HCC)   3. Hypertensive heart disease    4. Pure hypercholesterolemia   5. Chronic systolic CHF (congestive heart failure) ([place])   6. Nonrheumatic aortic valve stenosis   7. Nonrheumatic mitral valve regurgitation      PLAN:  In order of problems listed above:  1. ASCAD -s/p inferior MI 1998 with subsequent cath showing 50% stenosis ostial Left main, 60% stenosis proximal LAD, 90% stenosis mid LAD, occluded mid CFX, 70% stenosis proximal RCA, 80% stenosis ostial PDA, occluded OM 2  -s/p CABG w LIMA to D1-D2-LAD, SVG to OM-circ, SVG to PD-PL 05/23/97. -denies any anginal symptoms -continue statin and BB -no ASA due to DOAC  2.   PAF -she is maintaining NSR On exam today -denies any palpitations or bleeding problems on DOAC -continue Eilquis 2.5mg  BID (dosed for SCr> 1.5 and age>80) -SCr 1.7 and Hbg 11.1 in Nov 2021 -continue Lopressor 25mg  BID  3.  Hypertensive heart disease -BP is adequately controlled on exam today -continue Lopressor 25mg  BID  4.  HLD -LDL goal < 70 -LDL was 53 in Nov 2021 -continue atorvastatin 40mg  daily and Zetia 10mg  daily  5.  Chronic systolic CHF -echo in 6269 showed mildly reduced LVF with EF 45% with global HK, moderate BAE, mild MR and moderate TR with mild PHTN and mild to moderate PHTN -EF has normalized on echo in 2015 at 50% with severe LAE, moderate RAE, mild to moderate MR, moderate TR, moderate to severe AS and no significant PHTN -she does not appear overtly volume overloaded today -continue Lasix 40mg  daily  6.  Aortic stenosis -at least moderate by possibly moderate to severe by echo 02/2020 -will repeat 2D echo to make sure this has not progressed  7.  Mitral regurgitation -mild to moderate by echo a year ago -repeat echo to make sure that LVF is stable  8.  Chronic DOE -recently this seems to have worsened by her granddaughter's description although the patient denies it but she has some dementia -? Related to chronic diastolic HF, underlying pulmonary disease with COPD and possible valvular heart disease -check check TSH, BMET, CBC, BNP -check 2D echo -suspect this is more pulmonary given that it worsened after several bouts of PNA -her lungs sound very junky so I will get a PA and lat Cxray    Medication Adjustments/Labs and Tests Ordered: Current medicines are reviewed at length with the patient today.  Concerns regarding medicines are outlined above.  Medication changes, Labs and Tests ordered today are listed in the Patient Instructions below.  There are no Patient Instructions on file for this visit.   Signed, Fransico Him, MD  01/12/2021 1:10 PM  [facility] [address], [place], [place]  [address] Phone: [phone]; Fax: [phone]

## 2021-01-12 NOTE — Progress Notes (Signed)
130

## 2021-01-12 NOTE — Patient Instructions (Signed)
Medication Instructions:  Your physician recommends that you continue on your current medications as directed. Please refer to the Current Medication list given to you today.  *If you need a refill on your cardiac medications before your next appointment, please call your pharmacy*   Lab Work: Lab work to be done today--proBNP, BMP, CBC, TSH If you have labs (blood work) drawn today and your tests are completely normal, you will receive your results only by: Marland Kitchen MyChart Message (if you have MyChart) OR . A paper copy in the mail If you have any lab test that is abnormal or we need to change your treatment, we will call you to review the results.   Testing/Procedures: Have chest X-ray done at [facility] has requested that you have an echocardiogram. Echocardiography is a painless test that uses sound waves to create images of your heart. It provides your doctor with information about the size and shape of your heart and how well your heart's chambers and valves are working. This procedure takes approximately one hour. There are no restrictions for this procedure.     Follow-Up: At [facility], you and your health needs are our priority.  As part of our continuing mission to provide you with exceptional heart care, we have created designated Provider Care Teams.  These Care Teams include your primary Cardiologist (physician) and Advanced Practice Providers (APPs -  Physician Assistants and Nurse Practitioners) who all work together to provide you with the care you need, when you need it.  We recommend signing up for the patient portal called "MyChart".  Sign up information is provided on this After Visit Summary.  MyChart is used to connect with patients for Virtual Visits (Telemedicine).  Patients are able to view lab/test results, encounter notes, upcoming appointments, etc.  Non-urgent messages can be sent to your provider as well.   To learn more about what you can do  with MyChart, go to [email or web address].    Your next appointment:   6 month(s)  The format for your next appointment:   In Person  Provider:   You may see Fransico Him, MD or one of the following Advanced Practice Providers on your designated Care Team:    Melina Copa, PA-C  Ermalinda Barrios, PA-C    Other Instructions

## 2021-01-13 LAB — BASIC METABOLIC PANEL
BUN/Creatinine Ratio: 16 (ref 12–28)
BUN: 34 mg/dL — ABNORMAL HIGH (ref 8–27)
CO2: 25 mmol/L (ref 20–29)
Calcium: 9.5 mg/dL (ref 8.7–10.3)
Chloride: 98 mmol/L (ref 96–106)
Creatinine, Ser: 2.14 mg/dL — ABNORMAL HIGH (ref 0.57–1.00)
Glucose: 75 mg/dL (ref 65–99)
Potassium: 4.5 mmol/L (ref 3.5–5.2)
Sodium: 140 mmol/L (ref 134–144)
eGFR: 22 mL/min/{1.73_m2} — ABNORMAL LOW (ref >59)

## 2021-01-13 LAB — CBC
Hematocrit: 32.4 % — ABNORMAL LOW (ref 34.0–46.6)
Hemoglobin: 11.1 g/dL (ref 11.1–15.9)
MCH: 30.1 pg (ref 26.6–33.0)
MCHC: 34.3 g/dL (ref 31.5–35.7)
MCV: 88 fL (ref 79–97)
Platelets: 249 10*3/uL (ref 150–450)
RBC: 3.69 x10E6/uL — ABNORMAL LOW (ref 3.77–5.28)
RDW: 14.2 % (ref 11.7–15.4)
WBC: 9.6 10*3/uL (ref 3.4–10.8)

## 2021-01-13 LAB — PRO B NATRIURETIC PEPTIDE: NT-Pro BNP: 8741 pg/mL — ABNORMAL HIGH (ref 0–738)

## 2021-01-13 LAB — TSH: TSH: 2.2 u[IU]/mL (ref 0.450–4.500)

## 2021-01-15 ENCOUNTER — Telehealth: Payer: Self-pay | Admitting: *Deleted

## 2021-01-15 MED ORDER — FUROSEMIDE 40 MG PO TABS: 40.0000 mg | ORAL_TABLET | Freq: Two times a day (BID) | ORAL | 3 refills | Status: DC

## 2021-01-15 NOTE — Telephone Encounter (Signed)
Patient's grand daughter notified.  New prescription sent to [facility].  Appointment made for patient to see Melina Copa, PA on 01/18/21 at 2:15

## 2021-01-15 NOTE — Telephone Encounter (Signed)
-----   Message from Sueanne Margarita, MD sent at 01/15/2021  9:00 AM EDT ----- CBC stable and TSH normal.  BNP markedly elevated.  SCr increased slightly from baseline and likely from acute CHF - please increase Lasix to 40mg  BID and have her seen by extender or DOD in 2 days with repeat BMET - if SOB worsens in the mean time she needs to call office

## 2021-01-16 ENCOUNTER — Encounter: Payer: Self-pay | Admitting: Physician Assistant

## 2021-01-16 NOTE — Progress Notes (Deleted)
Cardiology Office Note    Date:  01/16/2021   ID:  [name], DOB Aug 10, 1934, MRN [id number]  PCP:  Laura Pretty, FNP  Cardiologist:  Laura Him, MD  Electrophysiologist:  None   Chief Complaint: ***  History of Present Illness:   Laura Jacobs is a 87 y.o. female with history of CAD (inferior MI 1998 ultimately undergoing CABG with LIMA to D1-D2-LAD, SVG to OM-circ, SVG to PD-PL), valvular heart disease as below, chronic atrial fibrillation, stage III-IV CKD, renal cysts, HLD, frequent PNA, COPD, gout, and recent suspected CHF who presents for follow-up. She was remotely followed by Dr. Wynonia Jacobs. Last 2D echo 02/09/20 showed EF 55% with indeterminate diastolic parameters, mildly reduced RV function, mildly elevated PASP, severe LAE, moderate RAE, mild-moderate MR, moderate TR, possibly moderate-severe AS.  She was recently seen by Dr. Radford Jacobs to establish care on 01/12/21. Her granddaughter felt her dyspnea was getting worse. 2D echo was ordered. She did not appear overtly volume overloaded but BNP was significantly elevated. CXR still pending. She was instructed to increase Lasix from 40mg  daily to BID with close follow-up.   CXR still pending Needs repeat bmet Echo expedited   Shortness of breath with at least some component of acute on chronic diastolic CHF CAD Valvular heart disease with mild-moderate MR, moderate TR, moderate-severe AS by echo 2021 AKI on CKD stage III-IV    Labwork independently reviewed: 01/2019 TSH wnl, Hgb 11.1, BNP 8741, K 4.5, Cr 2.14 (previous value 1.71 in 08/2020) 08/2020 LDL 53, LFTs ok   Past Medical History:  Diagnosis Date  . CAD (coronary artery disease)    s/p inferior MI 1998 with subsequent cath showing 50% stenosis ostial Left main, 60% stenosis proximal LAD, 90% stenosis mid LAD, occluded mid CFX, 70% stenosis proximal RCA, 80% stenosis ostial PDA, occluded OM 2 and underwent CABG w LIMA to D1-D2-LAD, SVG to  OM-circ, SVG to PD-PL 05/23/97.  Marland Kitchen Chronic atrial fibrillation ([place])   . Gout   . Hyperlipidemia   . Hypertension     Past Surgical History:  Procedure Laterality Date  . heart by pass      Current Medications: No outpatient medications have been marked as taking for the 01/18/21 encounter (Appointment) with Laura Pitter, PA-C.   ***   Allergies:   Patient has no known allergies.   Social History   Socioeconomic History  . Marital status: Widowed    Spouse name: Laura Jacobs  . Number of children: 6  . Years of education: 8  . Highest education level: 8th grade  Occupational History  . Occupation: retired  Tobacco Use  . Smoking status: Former Smoker    Packs/day: 0.50    Years: 60.00    Pack years: 30.00    Types: Cigarettes    Quit date: 08/07/2014    Years since quitting: 6.4  . Smokeless tobacco: Former Systems developer    Quit date: 05/11/2014  Vaping Use  . Vaping Use: Never used  Substance and Sexual Activity  . Alcohol use: No  . Drug use: No  . Sexual activity: Yes    Birth control/protection: Post-menopausal  Other Topics Concern  . Not on file  Social History Narrative   Retired, widowed, lives with two sons and a granddaughter, enjoys gardening.    Social Determinants of Health   Financial Resource Strain: Not on file  Food Insecurity: Not on file  Transportation Needs: Not on file  Physical Activity: Not on file  Stress:  Not on file  Social Connections: Not on file     Family History:  The patient's ***family history includes Alcohol abuse in her father; Alzheimer's disease in her sister; Cancer in her sister; Heart disease in her mother.  ROS:   Please see the history of present illness. Otherwise, review of systems is positive for ***.  All other systems are reviewed and otherwise negative.    EKGs/Labs/Other Studies Reviewed:    Studies reviewed are outlined and summarized above. Reports included below if pertinent.  2d echo 02/2020 1. Left  ventricular ejection fraction, by estimation, is 55%. The left  ventricle has normal function. The left ventricle demonstrates regional  wall motion abnormalities (see scoring diagram/findings for description).  Left ventricular diastolic  parameters are indeterminate.  2. Right ventricular systolic function is mildly reduced. The right  ventricular size is normal. There is mildly elevated pulmonary artery  systolic pressure.  3. Left atrial size was severely dilated.  4. Right atrial size was moderately dilated.  5. The mitral valve is grossly normal. Mild to moderate mitral valve  regurgitation.  6. Tricuspid valve regurgitation is moderate.  7. Morphologically, there appears to be at least moderate calcific aortic  stenosis, if not moderate to severe stenosis. The aortic valve is  tricuspid. Aortic valve regurgitation is not visualized.  8. The inferior vena cava is normal in size with greater than 50%  respiratory variability, suggesting right atrial pressure of 3 mmHg.     EKG:  EKG is ordered today, personally reviewed, demonstrating ***  Recent Labs: 02/11/2020: Magnesium 1.7 08/29/2020: ALT 6 01/12/2021: BUN 34; Creatinine, Ser 2.14; Hemoglobin 11.1; NT-Pro BNP 8,741; Platelets 249; Potassium 4.5; Sodium 140; TSH 2.200  Recent Lipid Panel    Component Value Date/Time   CHOL 120 08/29/2020 1207   CHOL 124 02/04/2013 1525   TRIG 69 08/29/2020 1207   TRIG 82 02/23/2015 1414   TRIG 139 02/04/2013 1525   HDL 53 08/29/2020 1207   HDL 43 02/23/2015 1414   HDL 35 (L) 02/04/2013 1525   CHOLHDL 2.3 08/29/2020 1207   LDLCALC 53 08/29/2020 1207   LDLCALC 46 06/03/2014 1605   LDLCALC 61 02/04/2013 1525    PHYSICAL EXAM:    VS:  There were no vitals taken for this visit.  BMI: There is no height or weight on file to calculate BMI.  GEN: Well nourished, well developed female in no acute distress HEENT: normocephalic, atraumatic Neck: no JVD, carotid bruits, or  masses Cardiac: ***RRR; no murmurs, rubs, or gallops, no edema  Respiratory:  clear to auscultation bilaterally, normal work of breathing GI: soft, nontender, nondistended, + BS MS: no deformity or atrophy Skin: warm and dry, no rash Neuro:  Alert and Oriented x 3, Strength and sensation are intact, follows commands Psych: euthymic mood, full affect  Wt Readings from Last 3 Encounters:  01/12/21 130 lb 12.8 oz (59.3 kg)  11/29/20 129 lb (58.5 kg)  08/29/20 130 lb (59 kg)     ASSESSMENT & PLAN:   1. ***  Disposition: F/u with ***   Medication Adjustments/Labs and Tests Ordered: Current medicines are reviewed at length with the patient today.  Concerns regarding medicines are outlined above. Medication changes, Labs and Tests ordered today are summarized above and listed in the Patient Instructions accessible in Encounters.   Signed, Laura Pitter, PA-C  01/16/2021 5:10 PM    [facility] [address], [place], [place]  [address] Phone: [phone])  [phone]; Fax: [phone]

## 2021-01-18 ENCOUNTER — Ambulatory Visit: Payer: Medicare Other | Admitting: Physician Assistant

## 2021-01-18 DIAGNOSIS — R0602 Shortness of breath: Secondary | ICD-10-CM

## 2021-01-18 DIAGNOSIS — I2510 Atherosclerotic heart disease of native coronary artery without angina pectoris: Secondary | ICD-10-CM

## 2021-01-18 DIAGNOSIS — I350 Nonrheumatic aortic (valve) stenosis: Secondary | ICD-10-CM

## 2021-01-18 DIAGNOSIS — I071 Rheumatic tricuspid insufficiency: Secondary | ICD-10-CM

## 2021-01-18 DIAGNOSIS — I5033 Acute on chronic diastolic (congestive) heart failure: Secondary | ICD-10-CM

## 2021-01-18 DIAGNOSIS — I340 Nonrheumatic mitral (valve) insufficiency: Secondary | ICD-10-CM

## 2021-01-18 DIAGNOSIS — N179 Acute kidney failure, unspecified: Secondary | ICD-10-CM

## 2021-02-06 ENCOUNTER — Ambulatory Visit (HOSPITAL_COMMUNITY)
Admission: RE | Admit: 2021-02-06 | Discharge: 2021-02-06 | Disposition: A | Discharge status: Home / Self Care | Payer: Medicare Other | Source: Ambulatory Visit | Attending: Cardiology | Admitting: Cardiology

## 2021-02-06 ENCOUNTER — Other Ambulatory Visit (HOSPITAL_COMMUNITY): Payer: Self-pay | Admitting: Cardiology

## 2021-02-06 ENCOUNTER — Encounter: Payer: Self-pay | Admitting: Cardiology

## 2021-02-06 ENCOUNTER — Other Ambulatory Visit: Payer: Self-pay

## 2021-02-06 DIAGNOSIS — I361 Nonrheumatic tricuspid (valve) insufficiency: Secondary | ICD-10-CM

## 2021-02-06 DIAGNOSIS — I350 Nonrheumatic aortic (valve) stenosis: Secondary | ICD-10-CM

## 2021-02-06 DIAGNOSIS — R059 Cough, unspecified: Secondary | ICD-10-CM | POA: Diagnosis not present

## 2021-02-06 DIAGNOSIS — R0602 Shortness of breath: Secondary | ICD-10-CM

## 2021-02-06 DIAGNOSIS — I340 Nonrheumatic mitral (valve) insufficiency: Secondary | ICD-10-CM | POA: Diagnosis not present

## 2021-02-06 LAB — ECHOCARDIOGRAM COMPLETE
AR max vel: 1.41 cm2
AV Area VTI: 1.42 cm2
AV Area mean vel: 1.43 cm2
AV Mean grad: 9 mmHg
AV Peak grad: 15.4 mmHg
Ao pk vel: 1.97 m/s
Area-P 1/2: 4.89 cm2
S' Lateral: 2.8 cm

## 2021-02-06 NOTE — Progress Notes (Signed)
*  PRELIMINARY RESULTS* Echocardiogram 2D Echocardiogram has been performed.  Laura Jacobs 02/06/2021, 2:30 PM

## 2021-02-07 ENCOUNTER — Telehealth: Payer: Self-pay

## 2021-02-07 DIAGNOSIS — I5022 Chronic systolic (congestive) heart failure: Secondary | ICD-10-CM

## 2021-02-07 DIAGNOSIS — I350 Nonrheumatic aortic (valve) stenosis: Secondary | ICD-10-CM

## 2021-02-07 DIAGNOSIS — I340 Nonrheumatic mitral (valve) insufficiency: Secondary | ICD-10-CM

## 2021-02-07 NOTE — Telephone Encounter (Signed)
-----   Message from Sueanne Margarita, MD sent at 02/06/2021  8:19 PM EDT ----- Echo showed normal LVF with mildly enlarged RV, severe BAE, moderate AS - repeat echo in 1 year for AS

## 2021-02-07 NOTE — Telephone Encounter (Signed)
Left message for patient with results (ok per DPR). Advised to call back with any questions. Echo ordered for repeat in one year.

## 2021-02-09 ENCOUNTER — Ambulatory Visit (INDEPENDENT_AMBULATORY_CARE_PROVIDER_SITE_OTHER): Payer: Medicare Other | Admitting: Family

## 2021-02-09 ENCOUNTER — Encounter: Payer: Self-pay | Admitting: Family

## 2021-02-09 VITALS — BP 127/83 | HR 95 | Temp 97.0°F | Ht 64.0 in | Wt 128.0 lb

## 2021-02-09 DIAGNOSIS — J441 Chronic obstructive pulmonary disease with (acute) exacerbation: Secondary | ICD-10-CM

## 2021-02-09 MED ORDER — PREDNISONE 10 MG PO TABS: ORAL_TABLET | ORAL | 0 refills | Status: DC

## 2021-02-09 MED ORDER — DOXYCYCLINE HYCLATE 100 MG PO TABS: 100.0000 mg | ORAL_TABLET | Freq: Two times a day (BID) | ORAL | 0 refills | Status: DC

## 2021-02-09 NOTE — Patient Instructions (Signed)

## 2021-02-09 NOTE — Progress Notes (Signed)
Subjective:    Patient ID: Laura Jacobs, female    DOB: Nov 04, 1933, 87 y.o.   MRN: [id number]  Chief Complaint  Patient presents with  . Shortness of Breath    Chest xray 05/04 showed Brohnchitis   . Cough    Shortness of Breath Associated symptoms include wheezing. Pertinent negatives include no ear pain or fever.  Cough This is a new problem. The current episode started 1 to 4 weeks ago. The problem has been waxing and waning. The problem occurs every few minutes. The cough is productive of purulent sputum. Associated symptoms include myalgias, nasal congestion, shortness of breath and wheezing. Pertinent negatives include no ear congestion, ear pain or fever. Risk factors for lung disease include smoking/tobacco exposure. She has tried OTC inhaler and oral steroids for the symptoms.      Review of Systems  Constitutional: Negative for fever.  HENT: Negative for ear pain.   Respiratory: Positive for cough, shortness of breath and wheezing.   Musculoskeletal: Positive for myalgias.  All other systems reviewed and are negative.      Objective:   Physical Exam Vitals reviewed.  Constitutional:      General: She is not in acute distress.    Appearance: She is well-developed.  HENT:     Head: Normocephalic and atraumatic.     Right Ear: External ear normal.  Eyes:     Pupils: Pupils are equal, round, and reactive to light.  Neck:     Thyroid: No thyromegaly.  Cardiovascular:     Rate and Rhythm: Normal rate and regular rhythm.     Heart sounds: Normal heart sounds. No murmur heard.   Pulmonary:     Effort: Pulmonary effort is normal. No respiratory distress.     Breath sounds: Wheezing and rhonchi present.  Abdominal:     General: Bowel sounds are normal. There is no distension.     Palpations: Abdomen is soft.     Tenderness: There is no abdominal tenderness.  Musculoskeletal:        General: No tenderness. Normal range of motion.     Cervical back:  Normal range of motion and neck supple.  Skin:    General: Skin is warm and dry.  Neurological:     Mental Status: She is alert and oriented to person, place, and time.     Cranial Nerves: No cranial nerve deficit.     Deep Tendon Reflexes: Reflexes are normal and symmetric.  Psychiatric:        Behavior: Behavior normal.        Thought Content: Thought content normal.        Judgment: Judgment normal.      BP 127/83   Pulse 95   Temp (!) 97 F (36.1 C) (Temporal)   Ht 5\' 4"  (1.626 m)   Wt 128 lb (58.1 kg)   SpO2 97%   BMI 21.97 kg/m       Assessment & Plan:  BULAH LURIE comes in today with chief complaint of Shortness of Breath (Chest xray 05/04 showed Brohnchitis ) and Cough   Diagnosis and orders addressed:  1. COPD exacerbation ([place]) - Take meds as prescribed - Use a cool mist humidifier  -Use saline nose sprays frequently -Force fluids -For any cough or congestion  Use plain Mucinex- regular strength or max strength is fine -For fever or aces or pains- take tylenol or ibuprofen. -Throat lozenges if help -RTO if symptoms worse or do not  improve  - predniSONE (DELTASONE) 10 MG tablet; Days 1-4 take 4 tablets (40 mg) daily  Days 5-8 take 3 tablets (30 mg) daily, Days 9-11 take 2 tablets (20 mg) daily, Days 12-14 take 1 tablet (10 mg) daily.  Dispense: 37 tablet; Refill: 0 - doxycycline (VIBRA-TABS) 100 MG tablet; Take 1 tablet (100 mg total) by mouth 2 (two) times daily.  Dispense: 20 tablet; Refill: 0   Evelina Dun, FNP

## 2021-03-08 ENCOUNTER — Encounter: Payer: Self-pay | Admitting: Nurse Practitioner

## 2021-03-08 ENCOUNTER — Ambulatory Visit (INDEPENDENT_AMBULATORY_CARE_PROVIDER_SITE_OTHER): Payer: Medicare Other | Admitting: Nurse Practitioner

## 2021-03-08 ENCOUNTER — Other Ambulatory Visit: Payer: Self-pay

## 2021-03-08 VITALS — BP 117/80 | HR 127 | Temp 97.0°F | Resp 20 | Ht 64.0 in | Wt 125.0 lb

## 2021-03-08 DIAGNOSIS — I482 Chronic atrial fibrillation, unspecified: Secondary | ICD-10-CM | POA: Diagnosis not present

## 2021-03-08 DIAGNOSIS — J418 Mixed simple and mucopurulent chronic bronchitis: Secondary | ICD-10-CM | POA: Diagnosis not present

## 2021-03-08 DIAGNOSIS — F411 Generalized anxiety disorder: Secondary | ICD-10-CM

## 2021-03-08 DIAGNOSIS — I2510 Atherosclerotic heart disease of native coronary artery without angina pectoris: Secondary | ICD-10-CM | POA: Diagnosis not present

## 2021-03-08 DIAGNOSIS — E782 Mixed hyperlipidemia: Secondary | ICD-10-CM | POA: Diagnosis not present

## 2021-03-08 DIAGNOSIS — N184 Chronic kidney disease, stage 4 (severe): Secondary | ICD-10-CM | POA: Diagnosis not present

## 2021-03-08 DIAGNOSIS — I119 Hypertensive heart disease without heart failure: Secondary | ICD-10-CM | POA: Diagnosis not present

## 2021-03-08 DIAGNOSIS — I5022 Chronic systolic (congestive) heart failure: Secondary | ICD-10-CM | POA: Diagnosis not present

## 2021-03-08 DIAGNOSIS — I480 Paroxysmal atrial fibrillation: Secondary | ICD-10-CM | POA: Diagnosis not present

## 2021-03-08 DIAGNOSIS — R630 Anorexia: Secondary | ICD-10-CM

## 2021-03-08 DIAGNOSIS — J449 Chronic obstructive pulmonary disease, unspecified: Secondary | ICD-10-CM | POA: Insufficient documentation

## 2021-03-08 DIAGNOSIS — E7800 Pure hypercholesterolemia, unspecified: Secondary | ICD-10-CM

## 2021-03-08 MED ORDER — APIXABAN 2.5 MG PO TABS: 2.5000 mg | ORAL_TABLET | Freq: Two times a day (BID) | ORAL | 1 refills | Status: DC

## 2021-03-08 MED ORDER — MIRTAZAPINE 7.5 MG PO TABS: 7.5000 mg | ORAL_TABLET | Freq: Every day | ORAL | 1 refills | Status: DC

## 2021-03-08 MED ORDER — ATORVASTATIN CALCIUM 40 MG PO TABS: ORAL_TABLET | ORAL | 1 refills | Status: DC

## 2021-03-08 MED ORDER — TRELEGY ELLIPTA 100-62.5-25 MCG/INH IN AEPB: 1.0000 | INHALATION_SPRAY | Freq: Every day | RESPIRATORY_TRACT | 11 refills | Status: DC

## 2021-03-08 MED ORDER — EZETIMIBE 10 MG PO TABS: 10.0000 mg | ORAL_TABLET | Freq: Every day | ORAL | 1 refills | Status: DC

## 2021-03-08 MED ORDER — METOPROLOL TARTRATE 25 MG PO TABS: ORAL_TABLET | ORAL | 1 refills | Status: DC

## 2021-03-08 MED ORDER — POTASSIUM CHLORIDE CRYS ER 20 MEQ PO TBCR: EXTENDED_RELEASE_TABLET | ORAL | 1 refills | Status: DC

## 2021-03-08 MED ORDER — CITALOPRAM HYDROBROMIDE 20 MG PO TABS: 20.0000 mg | ORAL_TABLET | Freq: Every day | ORAL | 1 refills | Status: DC

## 2021-03-08 NOTE — Patient Instructions (Signed)
Chronic Obstructive Pulmonary Disease  Chronic obstructive pulmonary disease (COPD) is a long-term (chronic) lung problem. When you have COPD, it is hard for air to get in and out of your lungs. Usually the condition gets worse over time, and your lungs will never return to normal. There are things you can do to keep yourself as healthy as possible. What are the causes?  Smoking. This is the most common cause.  Certain genes passed from parent to child (inherited). What increases the risk?  Being exposed to secondhand smoke from cigarettes, pipes, or cigars.  Being exposed to chemicals and other irritants, such as fumes and dust in the work environment.  Having chronic lung conditions or infections. What are the signs or symptoms?  Shortness of breath, especially during physical activity.  A long-term cough with a large amount of thick mucus. Sometimes, the cough may not have any mucus (dry cough).  Wheezing.  Breathing quickly.  Skin that looks gray or blue, especially in the fingers, toes, or lips.  Feeling tired (fatigue).  Weight loss.  Chest tightness.  Having infections often.  Episodes when breathing symptoms become much worse (exacerbations). At the later stages of this disease, you may have swelling in the ankles, feet, or legs. How is this treated?  Taking medicines.  Quitting smoking, if you smoke.  Rehabilitation. This includes steps to make your body work better. It may involve a team of specialists.  Doing exercises.  Making changes to your diet.  Using oxygen.  Lung surgery.  Lung transplant.  Comfort measures (palliative care). Follow these instructions at home: Medicines  Take over-the-counter and prescription medicines only as told by your doctor.  Talk to your doctor before taking any cough or allergy medicines. You may need to avoid medicines that cause your lungs to be dry. Lifestyle  If you smoke, stop smoking. Smoking makes the  problem worse.  Do not smoke or use any products that contain nicotine or tobacco. If you need help quitting, ask your doctor.  Avoid being around things that make your breathing worse. This may include smoke, chemicals, and fumes.  Stay active, but remember to rest as well.  Learn and use tips on how to manage stress and control your breathing.  Make sure you get enough sleep. Most adults need at least 7 hours of sleep every night.  Eat healthy foods. Eat smaller meals more often. Rest before meals. Controlled breathing Learn and use tips on how to control your breathing as told by your doctor. Try:  Breathing in (inhaling) through your nose for 1 second. Then, pucker your lips and breath out (exhale) through your lips for 2 seconds.  Putting one hand on your belly (abdomen). Breathe in slowly through your nose for 1 second. Your hand on your belly should move out. Pucker your lips and breathe out slowly through your lips. Your hand on your belly should move in as you breathe out.   Controlled coughing Learn and use controlled coughing to clear mucus from your lungs. Follow these steps: 1. Lean your head a little forward. 2. Breathe in deeply. 3. Try to hold your breath for 3 seconds. 4. Keep your mouth slightly open while coughing 2 times. 5. Spit any mucus out into a tissue. 6. Rest and do the steps again 1 or 2 times as needed. General instructions  Make sure you get all the shots (vaccines) that your doctor recommends. Ask your doctor about a flu shot and a pneumonia shot.    Use oxygen therapy and pulmonary rehabilitation if told by your doctor. If you need home oxygen therapy, ask your doctor if you should buy a tool to measure your oxygen level (oximeter).  Make a COPD action plan with your doctor. This helps you to know what to do if you feel worse than usual.  Manage any other conditions you have as told by your doctor.  Avoid going outside when it is very hot, cold, or  humid.  Avoid people who have a sickness you can catch (contagious).  Keep all follow-up visits. Contact a doctor if:  You cough up more mucus than usual.  There is a change in the color or thickness of the mucus.  It is harder to breathe than usual.  Your breathing is faster than usual.  You have trouble sleeping.  You need to use your medicines more often than usual.  You have trouble doing your normal activities such as getting dressed or walking around the house. Get help right away if:  You have shortness of breath while resting.  You have shortness of breath that stops you from: ? Being able to talk. ? Doing normal activities.  Your chest hurts for longer than 5 minutes.  Your skin color is more blue than usual.  Your pulse oximeter shows that you have low oxygen for longer than 5 minutes.  You have a fever.  You feel too tired to breathe normally. These symptoms may represent a serious problem that is an emergency. Do not wait to see if the symptoms will go away. Get medical help right away. Call your local emergency services (911 in the [place] Do not drive yourself to the hospital. Summary  Chronic obstructive pulmonary disease (COPD) is a long-term lung problem.  The way your lungs work will never return to normal. Usually the condition gets worse over time. There are things you can do to keep yourself as healthy as possible.  Take over-the-counter and prescription medicines only as told by your doctor.  If you smoke, stop. Smoking makes the problem worse. This information is not intended to replace advice given to you by your health care provider. Make sure you discuss any questions you have with your health care provider. Document Revised: 08/01/2020 Document Reviewed: 08/01/2020 Elsevier Patient Education  2021 Elsevier Inc.   

## 2021-03-08 NOTE — Progress Notes (Signed)
Subjective:    Patient ID: Laura Jacobs, female    DOB: [birth year], 87 y.o.   MRN: [id number]   Chief Complaint: Medical Management of Chronic Issues    HPI:  1. Hypertensive heart disease  No c/o chest pain, sob or headache. Does not check blood pressure at home. BP Readings from Last 3 Encounters:  03/08/21 117/80  02/09/21 127/83  01/12/21 122/78     2. Paroxysmal atrial fibrillation (HCC) Is on eliquis with  No sign of bleeding  3. Chronic atrial fibrillation ([place]) Again is on eliquis. Denies heart racing or palpitations.  4. Coronary artery disease involving native coronary artery of native heart without angina pectoris Last saw cardiology on 01/12/21. According to office note no changes were made to plan of care and she was to follow up in 6 months.  5. Chronic kidney disease (CKD), stage IV (severe) (HCC) No issues voiding and no edema. Has not seen nephrology in several years. Lab Results  Component Value Date   CREATININE 2.14 (H) 01/12/2021   BUN 34 (H) 01/12/2021   NA 140 01/12/2021   K 4.5 01/12/2021   CL 98 01/12/2021   CO2 25 01/12/2021     6. Pure hypercholesterolemia Eats what ever her family feds her. Lab Results  Component Value Date   CHOL 120 08/29/2020   HDL 53 08/29/2020   LDLCALC 53 08/29/2020   TRIG 69 08/29/2020   CHOLHDL 2.3 08/29/2020      7. Mixed simple and mucopurulent chronic bronchitis ([place]) Was seen on 02/09/21  with exacerbation of COPD. She was given prednisone and doxycycline. She uses her advair daily and uses breathing treatments at home  8. Hypokalemia Is on potassium supplement daily . Denies any lower ext cramping. Lab Results  Component Value Date   K 4.5 01/12/2021   9. CHF Has had some swelling in feet and ankle slately.    Outpatient Encounter Medications as of 03/08/2021  Medication Sig  . albuterol (PROVENTIL) (2.5 MG/3ML) 0.083% nebulizer solution Take 3 mLs (2.5 mg total) by nebulization every  2 (two) hours as needed for wheezing or shortness of breath.  Marland Kitchen albuterol (VENTOLIN HFA) 108 (90 Base) MCG/ACT inhaler Inhale 2 puffs into the lungs every 6 (six) hours as needed.  Marland Kitchen apixaban (ELIQUIS) 2.5 MG TABS tablet Take 1 tablet (2.5 mg total) by mouth 2 (two) times daily.  Marland Kitchen atorvastatin (LIPITOR) 40 MG tablet TAKE 1 TABLET ONCE DAILY IN THE EVENING  . calcium-vitamin D (OSCAL 500/200 D-3) 500-200 MG-UNIT per tablet Take 1 tablet by mouth 2 (two) times daily.  . citalopram (CELEXA) 20 MG tablet Take 1 tablet (20 mg total) by mouth daily.  Marland Kitchen ezetimibe (ZETIA) 10 MG tablet Take 1 tablet (10 mg total) by mouth daily.  . Fluticasone-Salmeterol (ADVAIR DISKUS) 250-50 MCG/DOSE AEPB Inhale 1 puff into the lungs 2 (two) times daily.  . furosemide (LASIX) 40 MG tablet Take 1 tablet (40 mg total) by mouth 2 (two) times daily.  Marland Kitchen ipratropium-albuterol (DUONEB) 0.5-2.5 (3) MG/3ML SOLN Take 3 mLs by nebulization 2 (two) times daily.  . metoprolol tartrate (LOPRESSOR) 25 MG tablet TAKE  (1)  TABLET TWICE A DAY.  . mirtazapine (REMERON) 7.5 MG tablet Take 1 tablet (7.5 mg total) by mouth at bedtime.  . Nebulizers (COMPRESSOR/NEBULIZER) MISC 1 Units by Does not apply route 3 times/day as needed-between meals & bedtime. -Nebulizer machine with tubing supplies to be used for administering nebulized medications as prescribed for COPD  .  nitroGLYCERIN (NITROSTAT) 0.4 MG SL tablet PLACE 1 TABLET UNDER THE TONGUE EVERY 5 MINUTES AS NEEDED FOR CHEST PA IN  . potassium chloride SA (KLOR-CON) 20 MEQ tablet TAKE  (1)  TABLET TWICE A DAY.    Past Surgical History:  Procedure Laterality Date  . heart by pass      Family History  Problem Relation Age of Onset  . Heart disease Mother   . Alcohol abuse Father   . Alzheimer's disease Sister   . Cancer Sister     New complaints: None new  Social history: Lives with her son  Controlled substance contract: n/a    Review of Systems  Constitutional:  Negative for diaphoresis.  Eyes: Negative for pain.  Respiratory: Positive for cough and shortness of breath.   Cardiovascular: Negative for chest pain, palpitations and leg swelling.  Gastrointestinal: Negative for abdominal pain.  Endocrine: Negative for polydipsia.  Skin: Negative for rash.  Neurological: Negative for dizziness, weakness and headaches.  Hematological: Does not bruise/bleed easily.  All other systems reviewed and are negative.      Objective:   Physical Exam Vitals and nursing note reviewed.  Constitutional:      General: She is not in acute distress.    Appearance: Normal appearance. She is well-developed.  HENT:     Head: Normocephalic.     Nose: Nose normal.  Eyes:     Pupils: Pupils are equal, round, and reactive to light.  Neck:     Vascular: No carotid bruit or JVD.  Cardiovascular:     Rate and Rhythm: Normal rate and regular rhythm.     Heart sounds: Normal heart sounds.  Pulmonary:     Effort: Pulmonary effort is normal. No respiratory distress.     Breath sounds: Wheezing (throughout) and rhonchi (throughout) present. No rales.  Chest:     Chest wall: No tenderness.  Abdominal:     General: Bowel sounds are normal. There is no distension or abdominal bruit.     Palpations: Abdomen is soft. There is no hepatomegaly, splenomegaly, mass or pulsatile mass.     Tenderness: There is no abdominal tenderness.  Musculoskeletal:        General: Normal range of motion.     Cervical back: Normal range of motion and neck supple.  Lymphadenopathy:     Cervical: No cervical adenopathy.  Skin:    General: Skin is warm and dry.  Neurological:     Mental Status: She is alert and oriented to person, place, and time.     Deep Tendon Reflexes: Reflexes are normal and symmetric.  Psychiatric:        Behavior: Behavior normal.        Thought Content: Thought content normal.        Judgment: Judgment normal.   BP 117/80   Pulse (!) 127   Temp (!) 97 F  (36.1 C) (Temporal)   Resp 20   Ht _0  (1.626 m)   Wt 125 lb (56.7 kg)   SpO2 97%   BMI 21.46 kg/m         Assessment & Plan:  Laura Jacobs comes in today with chief complaint of Medical Management of Chronic Issues   Diagnosis and orders addressed:  1. Hypertensive heart disease  Low sodium diet - CBC with Differential/Platelet - CMP14+EGFR  2. Paroxysmal atrial fibrillation (HCC) Avoid caffeine - metoprolol tartrate (LOPRESSOR) 25 MG tablet; TAKE  (1)  TABLET TWICE A DAY.  Dispense:  180 tablet; Refill: 1  3. Chronic atrial fibrillation ([place])  4. Coronary artery disease involving native coronary artery of native heart without angina pectoris Need to follow up with cardiology - potassium chloride SA (KLOR-CON) 20 MEQ tablet; TAKE  (1)  TABLET TWICE A DAY.  Dispense: 180 tablet; Refill: 1  5. Chronic kidney disease (CKD), stage IV (severe) ([place]) Labs pending Will do referral back to nephrology  6. Pure hypercholesterolemia Low fat diet - Lipid panel  7. Mixed simple and mucopurulent chronic bronchitis ([place]) Stop advair Added trelogy - Fluticasone-Umeclidin-Vilant (TRELEGY ELLIPTA) 100-62.5-25 MCG/INH AEPB; Inhale 1 puff into the lungs daily.  Dispense: 1 each; Refill: 11  8. Chronic systolic CHF (congestive heart failure) (HCC) Elevate legs when sitting Limit fluid intake - apixaban (ELIQUIS) 2.5 MG TABS tablet; Take 1 tablet (2.5 mg total) by mouth 2 (two) times daily.  Dispense: 180 tablet; Refill: 1  9. Decreased appetite - mirtazapine (REMERON) 7.5 MG tablet; Take 1 tablet (7.5 mg total) by mouth at bedtime.  Dispense: 90 tablet; Refill: 1  10. GAD (generalized anxiety disorder) Stress management - citalopram (CELEXA) 20 MG tablet; Take 1 tablet (20 mg total) by mouth daily.  Dispense: 90 tablet; Refill: 1  11. Mixed hyperlipidemia Low fat diet - ezetimibe (ZETIA) 10 MG tablet; Take 1 tablet (10 mg total) by mouth daily.  Dispense: 90 tablet;  Refill: 1 - atorvastatin (LIPITOR) 40 MG tablet; TAKE 1 TABLET ONCE DAILY IN THE EVENING  Dispense: 90 tablet; Refill: 1   Labs pending Health Maintenance reviewed Diet and exercise encouraged  Follow up plan: 3 months   [name], FNP

## 2021-03-09 LAB — CBC WITH DIFFERENTIAL/PLATELET
Basophils Absolute: 0 10*3/uL (ref 0.0–0.2)
Basos: 0 %
EOS (ABSOLUTE): 0 10*3/uL (ref 0.0–0.4)
Eos: 0 %
Hematocrit: 35.8 % (ref 34.0–46.6)
Hemoglobin: 11.9 g/dL (ref 11.1–15.9)
Immature Grans (Abs): 0.1 10*3/uL (ref 0.0–0.1)
Immature Granulocytes: 1 %
Lymphocytes Absolute: 1.2 10*3/uL (ref 0.7–3.1)
Lymphs: 14 %
MCH: 29.5 pg (ref 26.6–33.0)
MCHC: 33.2 g/dL (ref 31.5–35.7)
MCV: 89 fL (ref 79–97)
Monocytes Absolute: 0.8 10*3/uL (ref 0.1–0.9)
Monocytes: 9 %
Neutrophils Absolute: 6.4 10*3/uL (ref 1.4–7.0)
Neutrophils: 76 %
Platelets: 164 10*3/uL (ref 150–450)
RBC: 4.04 x10E6/uL (ref 3.77–5.28)
RDW: 13.7 % (ref 11.7–15.4)
WBC: 8.5 10*3/uL (ref 3.4–10.8)

## 2021-03-09 LAB — CMP14+EGFR
ALT: 8 IU/L (ref 0–32)
AST: 11 IU/L (ref 0–40)
Albumin/Globulin Ratio: 1.1 — ABNORMAL LOW (ref 1.2–2.2)
Albumin: 3.2 g/dL — ABNORMAL LOW (ref 3.6–4.6)
Alkaline Phosphatase: 103 IU/L (ref 44–121)
BUN/Creatinine Ratio: 12 (ref 12–28)
BUN: 23 mg/dL (ref 8–27)
Bilirubin Total: 0.9 mg/dL (ref 0.0–1.2)
CO2: 24 mmol/L (ref 20–29)
Calcium: 8.3 mg/dL — ABNORMAL LOW (ref 8.7–10.3)
Chloride: 102 mmol/L (ref 96–106)
Creatinine, Ser: 1.94 mg/dL — ABNORMAL HIGH (ref 0.57–1.00)
Globulin, Total: 3 g/dL (ref 1.5–4.5)
Glucose: 83 mg/dL (ref 65–99)
Potassium: 4.5 mmol/L (ref 3.5–5.2)
Sodium: 142 mmol/L (ref 134–144)
Total Protein: 6.2 g/dL (ref 6.0–8.5)
eGFR: 25 mL/min/{1.73_m2} — ABNORMAL LOW (ref >59)

## 2021-03-09 LAB — LIPID PANEL
Chol/HDL Ratio: 2.5 ratio (ref 0.0–4.4)
Cholesterol, Total: 92 mg/dL — ABNORMAL LOW (ref 100–199)
HDL: 37 mg/dL — ABNORMAL LOW (ref >39)
LDL Chol Calc (NIH): 41 mg/dL (ref 0–99)
Triglycerides: 64 mg/dL (ref 0–149)
VLDL Cholesterol Cal: 14 mg/dL (ref 5–40)

## 2021-04-19 ENCOUNTER — Observation Stay (HOSPITAL_COMMUNITY)
Admission: EM | Admit: 2021-04-19 | Discharge: 2021-04-20 | Disposition: A | Discharge status: Home / Self Care | Payer: Medicare Other | Attending: Internal Medicine | Admitting: Internal Medicine

## 2021-04-19 ENCOUNTER — Other Ambulatory Visit: Payer: Self-pay

## 2021-04-19 ENCOUNTER — Encounter (HOSPITAL_COMMUNITY): Payer: Self-pay | Admitting: Emergency Medicine

## 2021-04-19 ENCOUNTER — Emergency Department (HOSPITAL_COMMUNITY): Payer: Medicare Other

## 2021-04-19 DIAGNOSIS — Z79899 Other long term (current) drug therapy: Secondary | ICD-10-CM | POA: Insufficient documentation

## 2021-04-19 DIAGNOSIS — I2510 Atherosclerotic heart disease of native coronary artery without angina pectoris: Secondary | ICD-10-CM | POA: Insufficient documentation

## 2021-04-19 DIAGNOSIS — I4891 Unspecified atrial fibrillation: Secondary | ICD-10-CM

## 2021-04-19 DIAGNOSIS — I5032 Chronic diastolic (congestive) heart failure: Secondary | ICD-10-CM | POA: Diagnosis not present

## 2021-04-19 DIAGNOSIS — Z743 Need for continuous supervision: Secondary | ICD-10-CM | POA: Diagnosis not present

## 2021-04-19 DIAGNOSIS — J189 Pneumonia, unspecified organism: Secondary | ICD-10-CM

## 2021-04-19 DIAGNOSIS — Z20822 Contact with and (suspected) exposure to covid-19: Secondary | ICD-10-CM | POA: Insufficient documentation

## 2021-04-19 DIAGNOSIS — J9601 Acute respiratory failure with hypoxia: Principal | ICD-10-CM | POA: Insufficient documentation

## 2021-04-19 DIAGNOSIS — R0602 Shortness of breath: Secondary | ICD-10-CM | POA: Diagnosis not present

## 2021-04-19 DIAGNOSIS — J449 Chronic obstructive pulmonary disease, unspecified: Secondary | ICD-10-CM | POA: Insufficient documentation

## 2021-04-19 DIAGNOSIS — Z87891 Personal history of nicotine dependence: Secondary | ICD-10-CM | POA: Insufficient documentation

## 2021-04-19 DIAGNOSIS — I130 Hypertensive heart and chronic kidney disease with heart failure and stage 1 through stage 4 chronic kidney disease, or unspecified chronic kidney disease: Secondary | ICD-10-CM | POA: Diagnosis not present

## 2021-04-19 DIAGNOSIS — I499 Cardiac arrhythmia, unspecified: Secondary | ICD-10-CM | POA: Diagnosis not present

## 2021-04-19 DIAGNOSIS — I482 Chronic atrial fibrillation, unspecified: Secondary | ICD-10-CM | POA: Diagnosis not present

## 2021-04-19 DIAGNOSIS — Z7901 Long term (current) use of anticoagulants: Secondary | ICD-10-CM | POA: Diagnosis not present

## 2021-04-19 DIAGNOSIS — R6889 Other general symptoms and signs: Secondary | ICD-10-CM | POA: Diagnosis not present

## 2021-04-19 DIAGNOSIS — R404 Transient alteration of awareness: Secondary | ICD-10-CM | POA: Diagnosis not present

## 2021-04-19 DIAGNOSIS — N184 Chronic kidney disease, stage 4 (severe): Secondary | ICD-10-CM | POA: Insufficient documentation

## 2021-04-19 DIAGNOSIS — I517 Cardiomegaly: Secondary | ICD-10-CM | POA: Diagnosis not present

## 2021-04-19 LAB — COMPREHENSIVE METABOLIC PANEL
ALT: 8 U/L (ref 0–44)
AST: 14 U/L — ABNORMAL LOW (ref 15–41)
Albumin: 2.9 g/dL — ABNORMAL LOW (ref 3.5–5.0)
Alkaline Phosphatase: 87 U/L (ref 38–126)
Anion gap: 8 (ref 5–15)
BUN: 21 mg/dL (ref 8–23)
CO2: 25 mmol/L (ref 22–32)
Calcium: 8.1 mg/dL — ABNORMAL LOW (ref 8.9–10.3)
Chloride: 101 mmol/L (ref 98–111)
Creatinine, Ser: 2.26 mg/dL — ABNORMAL HIGH (ref 0.44–1.00)
GFR, Estimated: 21 mL/min — ABNORMAL LOW (ref >60)
Glucose, Bld: 98 mg/dL (ref 70–99)
Potassium: 3.6 mmol/L (ref 3.5–5.1)
Sodium: 134 mmol/L — ABNORMAL LOW (ref 135–145)
Total Bilirubin: 1.4 mg/dL — ABNORMAL HIGH (ref 0.3–1.2)
Total Protein: 6.4 g/dL — ABNORMAL LOW (ref 6.5–8.1)

## 2021-04-19 LAB — CBC WITH DIFFERENTIAL/PLATELET
Abs Immature Granulocytes: 0.09 10*3/uL — ABNORMAL HIGH (ref 0.00–0.07)
Basophils Absolute: 0 10*3/uL (ref 0.0–0.1)
Basophils Relative: 0 %
Eosinophils Absolute: 0 10*3/uL (ref 0.0–0.5)
Eosinophils Relative: 0 %
HCT: 31.9 % — ABNORMAL LOW (ref 36.0–46.0)
Hemoglobin: 10.3 g/dL — ABNORMAL LOW (ref 12.0–15.0)
Immature Granulocytes: 1 %
Lymphocytes Relative: 4 %
Lymphs Abs: 0.4 10*3/uL — ABNORMAL LOW (ref 0.7–4.0)
MCH: 29.9 pg (ref 26.0–34.0)
MCHC: 32.3 g/dL (ref 30.0–36.0)
MCV: 92.5 fL (ref 80.0–100.0)
Monocytes Absolute: 1.2 10*3/uL — ABNORMAL HIGH (ref 0.1–1.0)
Monocytes Relative: 10 %
Neutro Abs: 10.1 10*3/uL — ABNORMAL HIGH (ref 1.7–7.7)
Neutrophils Relative %: 85 %
Platelets: 211 10*3/uL (ref 150–400)
RBC: 3.45 MIL/uL — ABNORMAL LOW (ref 3.87–5.11)
RDW: 15.5 % (ref 11.5–15.5)
WBC: 11.9 10*3/uL — ABNORMAL HIGH (ref 4.0–10.5)
nRBC: 0 % (ref 0.0–0.2)

## 2021-04-19 LAB — RESP PANEL BY RT-PCR (FLU A&B, COVID) ARPGX2
Influenza A by PCR: NEGATIVE
Influenza B by PCR: NEGATIVE
SARS Coronavirus 2 by RT PCR: NEGATIVE

## 2021-04-19 LAB — LACTIC ACID, PLASMA: Lactic Acid, Venous: 1.4 mmol/L (ref 0.5–1.9)

## 2021-04-19 LAB — BRAIN NATRIURETIC PEPTIDE: B Natriuretic Peptide: 389 pg/mL — ABNORMAL HIGH (ref 0.0–100.0)

## 2021-04-19 MED ORDER — SODIUM CHLORIDE 0.9% FLUSH: 3.0000 mL | INTRAVENOUS | Status: DC | PRN

## 2021-04-19 MED ORDER — SODIUM CHLORIDE 0.9 % IV SOLN: INTRAVENOUS | Status: DC

## 2021-04-19 MED ORDER — SODIUM CHLORIDE 0.9 % IV BOLUS
500.0000 mL | Freq: Once | INTRAVENOUS | Status: AC
  Administered 2021-04-19: 500 mL via INTRAVENOUS

## 2021-04-19 MED ORDER — SODIUM CHLORIDE 0.9 % IV SOLN
1.0000 g | Freq: Once | INTRAVENOUS | Status: AC
  Administered 2021-04-19: 1 g via INTRAVENOUS
  Filled 2021-04-19: qty 10

## 2021-04-19 MED ORDER — MIRTAZAPINE 15 MG PO TABS
7.5000 mg | ORAL_TABLET | Freq: Every day | ORAL | Status: DC
  Administered 2021-04-19: 7.5 mg via ORAL
  Filled 2021-04-19: qty 1

## 2021-04-19 MED ORDER — LEVALBUTEROL HCL 0.63 MG/3ML IN NEBU: 0.6300 mg | INHALATION_SOLUTION | Freq: Four times a day (QID) | RESPIRATORY_TRACT | Status: DC | PRN

## 2021-04-19 MED ORDER — APIXABAN 2.5 MG PO TABS
2.5000 mg | ORAL_TABLET | Freq: Two times a day (BID) | ORAL | Status: DC
  Administered 2021-04-19 – 2021-04-20 (×2): 2.5 mg via ORAL
  Filled 2021-04-19 (×2): qty 1

## 2021-04-19 MED ORDER — METOPROLOL TARTRATE 25 MG PO TABS
25.0000 mg | ORAL_TABLET | Freq: Two times a day (BID) | ORAL | Status: DC
  Administered 2021-04-20: 25 mg via ORAL
  Filled 2021-04-19: qty 1

## 2021-04-19 MED ORDER — CITALOPRAM HYDROBROMIDE 20 MG PO TABS
20.0000 mg | ORAL_TABLET | Freq: Every day | ORAL | Status: DC
  Administered 2021-04-20: 20 mg via ORAL
  Filled 2021-04-19: qty 1

## 2021-04-19 MED ORDER — SODIUM CHLORIDE 0.9% FLUSH
3.0000 mL | Freq: Two times a day (BID) | INTRAVENOUS | Status: DC
  Administered 2021-04-19 – 2021-04-20 (×2): 3 mL via INTRAVENOUS

## 2021-04-19 MED ORDER — ACETAMINOPHEN 325 MG PO TABS: 650.0000 mg | ORAL_TABLET | Freq: Four times a day (QID) | ORAL | Status: DC | PRN

## 2021-04-19 MED ORDER — SODIUM CHLORIDE 0.9 % IV SOLN: 250.0000 mL | INTRAVENOUS | Status: DC | PRN

## 2021-04-19 MED ORDER — ONDANSETRON HCL 4 MG PO TABS: 4.0000 mg | ORAL_TABLET | Freq: Four times a day (QID) | ORAL | Status: DC | PRN

## 2021-04-19 MED ORDER — SODIUM CHLORIDE 0.9 % IV SOLN
1.0000 g | INTRAVENOUS | Status: DC
  Administered 2021-04-20: 1 g via INTRAVENOUS
  Filled 2021-04-19: qty 10

## 2021-04-19 MED ORDER — GUAIFENESIN-DM 100-10 MG/5ML PO SYRP: 5.0000 mL | ORAL_SOLUTION | ORAL | Status: DC | PRN

## 2021-04-19 MED ORDER — CALCIUM CARBONATE-VITAMIN D 500-200 MG-UNIT PO TABS
1.0000 | ORAL_TABLET | Freq: Two times a day (BID) | ORAL | Status: DC
  Administered 2021-04-19 – 2021-04-20 (×2): 1 via ORAL
  Filled 2021-04-19 (×2): qty 1

## 2021-04-19 MED ORDER — SODIUM CHLORIDE 0.9 % IV SOLN
500.0000 mg | Freq: Once | INTRAVENOUS | Status: AC
  Administered 2021-04-19: 500 mg via INTRAVENOUS
  Filled 2021-04-19: qty 500

## 2021-04-19 MED ORDER — EZETIMIBE 10 MG PO TABS
10.0000 mg | ORAL_TABLET | Freq: Every day | ORAL | Status: DC
  Administered 2021-04-20: 10 mg via ORAL
  Filled 2021-04-19: qty 1

## 2021-04-19 MED ORDER — ATORVASTATIN CALCIUM 40 MG PO TABS
40.0000 mg | ORAL_TABLET | Freq: Every day | ORAL | Status: DC
  Administered 2021-04-19: 40 mg via ORAL
  Filled 2021-04-19: qty 1

## 2021-04-19 MED ORDER — ONDANSETRON HCL 4 MG/2ML IJ SOLN: 4.0000 mg | Freq: Four times a day (QID) | INTRAMUSCULAR | Status: DC | PRN

## 2021-04-19 MED ORDER — SODIUM CHLORIDE 0.9 % IV SOLN: 500.0000 mg | INTRAVENOUS | Status: DC

## 2021-04-19 MED ORDER — ACETAMINOPHEN 650 MG RE SUPP: 650.0000 mg | Freq: Four times a day (QID) | RECTAL | Status: DC | PRN

## 2021-04-19 NOTE — H&P (Signed)
History and Physical    Laura Jacobs ZOX:[id number] DOB: [birth year] DOA: 04/19/2021  PCP: [name], FNP   Patient coming from: Home  Chief Complaint: Dyspnea  HPI: Laura Jacobs is a 87 y.o. female with medical history significant for chronic atrial fibrillation on Eliquis, chronic diastolic CHF, hypertension, CAD, COPD, and CKD stage IIIb who presented to the ED with worsening shortness of breath.  She apparently had a coughing spell earlier today and then vomited as well.  Apparently she has had weakness for approximately 3 weeks.  No fevers or chills noted.  She denies any coughing.   ED Course: Noted to be mildly tachycardic with soft blood pressure readings for which fluid bolus has been initiated in the ED.  She is noted to have leukocytosis of 11,900, hemoglobin 10.3, sodium 134, and creatinine 2.26 which appears to be consistent with her CKD.  Chest x-ray with left mid to lower lung atypical/viral pneumonia suggested.  COVID testing negative.  She has been given Rocephin and azithromycin and then later on blood cultures were obtained given softer blood pressure readings.  She is currently requiring 2 L nasal cannula oxygen.  Review of Systems: Reviewed as noted above, otherwise negative.  Past Medical History:  Diagnosis Date   Aortic stenosis    Moderate by echo >>low flow low gradient preserved EF (SVI < 35 and DI 0.34) by echo 02/2021   CAD (coronary artery disease)    s/p inferior MI 1998 with subsequent cath showing 50% stenosis ostial Left main, 60% stenosis proximal LAD, 90% stenosis mid LAD, occluded mid CFX, 70% stenosis proximal RCA, 80% stenosis ostial PDA, occluded OM 2 and underwent CABG w LIMA to D1-D2-LAD, SVG to OM-circ, SVG to PD-PL 05/23/97.   Chronic atrial fibrillation (HCC)    Chronic diastolic CHF (congestive heart failure) (HCC)    CKD (chronic kidney disease), stage III (HCC)    COPD (chronic obstructive pulmonary disease) (HCC)     Gout    Gout    Hyperlipidemia    Hypertension    Pneumonia    Renal cysts and diabetes syndrome (HCC)    Valvular heart disease     Past Surgical History:  Procedure Laterality Date   heart by pass       reports that she quit smoking about 6 years ago. Her smoking use included cigarettes. She has a 30.00 pack-year smoking history. She quit smokeless tobacco use about 6 years ago. She reports that she does not drink alcohol and does not use drugs.  No Known Allergies  Family History  Problem Relation Age of Onset   Heart disease Mother    Alcohol abuse Father    Alzheimer's disease Sister    Cancer Sister     Prior to Admission medications   Medication Sig Start Date End Date Taking? Authorizing Provider  albuterol (PROVENTIL) (2.5 MG/3ML) 0.083% nebulizer solution Take 3 mLs (2.5 mg total) by nebulization every 2 (two) hours as needed for wheezing or shortness of breath. 03/27/20   Emokpae, Courage, MD  albuterol (VENTOLIN HFA) 108 (90 Base) MCG/ACT inhaler Inhale 2 puffs into the lungs every 6 (six) hours as needed. 03/27/20   Roxan Hockey, MD  apixaban (ELIQUIS) 2.5 MG TABS tablet Take 1 tablet (2.5 mg total) by mouth 2 (two) times daily. 03/08/21   [name], FNP  atorvastatin (LIPITOR) 40 MG tablet TAKE 1 TABLET ONCE DAILY IN THE EVENING 03/08/21   [name], FNP  calcium-vitamin D (OSCAL 500/200  D-3) 500-200 MG-UNIT per tablet Take 1 tablet by mouth 2 (two) times daily. 02/27/15   Cherre Robins, PharmD  citalopram (CELEXA) 20 MG tablet Take 1 tablet (20 mg total) by mouth daily. 03/08/21   Hassell Done, Mary-Margaret, FNP  ezetimibe (ZETIA) 10 MG tablet Take 1 tablet (10 mg total) by mouth daily. 03/08/21   Hassell Done, Mary-Margaret, FNP  Fluticasone-Umeclidin-Vilant (TRELEGY ELLIPTA) 100-62.5-25 MCG/INH AEPB Inhale 1 puff into the lungs daily. 03/08/21   Hassell Done Mary-Margaret, FNP  furosemide (LASIX) 40 MG tablet Take 1 tablet (40 mg total) by mouth 2 (two) times  daily. 01/15/21   Sueanne Margarita, MD  ipratropium-albuterol (DUONEB) 0.5-2.5 (3) MG/3ML SOLN Take 3 mLs by nebulization 2 (two) times daily. 03/27/20   Roxan Hockey, MD  metoprolol tartrate (LOPRESSOR) 25 MG tablet TAKE  (1)  TABLET TWICE A DAY. 03/08/21   Hassell Done, Mary-Margaret, FNP  mirtazapine (REMERON) 7.5 MG tablet Take 1 tablet (7.5 mg total) by mouth at bedtime. 03/08/21   [name], FNP  Nebulizers (COMPRESSOR/NEBULIZER) MISC 1 Units by Does not apply route 3 times/day as needed-between meals & bedtime. -Nebulizer machine with tubing supplies to be used for administering nebulized medications as prescribed for COPD 03/27/20   Roxan Hockey, MD  nitroGLYCERIN (NITROSTAT) 0.4 MG SL tablet PLACE 1 TABLET UNDER THE TONGUE EVERY 5 MINUTES AS NEEDED FOR CHEST PA IN 11/29/20   [name], FNP  potassium chloride SA (KLOR-CON) 20 MEQ tablet TAKE  (1)  TABLET TWICE A DAY. 03/08/21   [name], FNP    Physical Exam: Vitals:   04/19/21 1345 04/19/21 1412 04/19/21 1413 04/19/21 1425  BP:   (!) 92/59   Pulse: (!) 102 (!) 102 (!) 101 (!) 102  Resp: 19 (!) 21 (!) 21 (!) 23  Temp:      TempSrc:      SpO2: 99% 100% 100% 100%  Weight:      Height:        Constitutional: NAD, calm, comfortable, hard of hearing Vitals:   04/19/21 1345 04/19/21 1412 04/19/21 1413 04/19/21 1425  BP:   (!) 92/59   Pulse: (!) 102 (!) 102 (!) 101 (!) 102  Resp: 19 (!) 21 (!) 21 (!) 23  Temp:      TempSrc:      SpO2: 99% 100% 100% 100%  Weight:      Height:       Eyes: lids and conjunctivae normal Neck: normal, supple Respiratory: clear to auscultation bilaterally. Normal respiratory effort. No accessory muscle use.  Currently on 2 L nasal cannula oxygen. Cardiovascular: Regular rate and rhythm, no murmurs. Abdomen: no tenderness, no distention. Bowel sounds positive.  Musculoskeletal:  No edema. Skin: no rashes, lesions, ulcers.  Psychiatric: Flat affect  Labs on Admission: I  have personally reviewed following labs and imaging studies  CBC: Recent Labs  Lab 04/19/21 1150  WBC 11.9*  NEUTROABS 10.1*  HGB 10.3*  HCT 31.9*  MCV 92.5  PLT 423   Basic Metabolic Panel: Recent Labs  Lab 04/19/21 1150  NA 134*  K 3.6  CL 101  CO2 25  GLUCOSE 98  BUN 21  CREATININE 2.26*  CALCIUM 8.1*   GFR: Estimated Creatinine Clearance: 16.3 mL/min (A) (by C-G formula based on SCr of 2.26 mg/dL (H)). Liver Function Tests: Recent Labs  Lab 04/19/21 1150  AST 14*  ALT 8  ALKPHOS 87  BILITOT 1.4*  PROT 6.4*  ALBUMIN 2.9*   No results for input(s): LIPASE, AMYLASE  in the last 168 hours. No results for input(s): AMMONIA in the last 168 hours. Coagulation Profile: No results for input(s): INR, PROTIME in the last 168 hours. Cardiac Enzymes: No results for input(s): CKTOTAL, CKMB, CKMBINDEX, TROPONINI in the last 168 hours. BNP (last 3 results) Recent Labs    01/12/21 1356  PROBNP 8,741*   HbA1C: No results for input(s): HGBA1C in the last 72 hours. CBG: No results for input(s): GLUCAP in the last 168 hours. Lipid Profile: No results for input(s): CHOL, HDL, LDLCALC, TRIG, CHOLHDL, LDLDIRECT in the last 72 hours. Thyroid Function Tests: No results for input(s): TSH, T4TOTAL, FREET4, T3FREE, THYROIDAB in the last 72 hours. Anemia Panel: No results for input(s): VITAMINB12, FOLATE, FERRITIN, TIBC, IRON, RETICCTPCT in the last 72 hours. Urine analysis:    Component Value Date/Time   COLORURINE YELLOW 02/09/2020 1530   APPEARANCEUR HAZY (A) 02/09/2020 1530   LABSPEC 1.013 02/09/2020 1530   PHURINE 5.0 02/09/2020 1530   GLUCOSEU NEGATIVE 02/09/2020 1530   HGBUR MODERATE (A) 02/09/2020 1530   BILIRUBINUR NEGATIVE 02/09/2020 1530   BILIRUBINUR NEG 01/05/2013 1106   [place] 02/09/2020 1530   PROTEINUR NEGATIVE 02/09/2020 1530   UROBILINOGEN 1.0 08/23/2014 0001   NITRITE NEGATIVE 02/09/2020 1530   LEUKOCYTESUR LARGE (A) 02/09/2020 1530     Radiological Exams on Admission: DG Chest Port 1 View  Result Date: 04/19/2021 CLINICAL DATA:  Shortness of breath EXAM: PORTABLE CHEST 1 VIEW COMPARISON:  02/06/2021 FINDINGS: Post CABG changes. Stable cardiomegaly. Atherosclerotic calcification of the aortic knob. Chronically coarsened interstitial markings with new patchy airspace opacities within the left mid to lower lung. No pleural effusion or pneumothorax. IMPRESSION: New patchy airspace opacities within the left mid to lower lung concerning for multifocal atypical/viral infection. Electronically Signed   By: Davina Poke D.O.   On: 04/19/2021 11:46    EKG: Independently reviewed. Aflutter 129bpm.  Assessment/Plan Active Problems:   Acute hypoxemic respiratory failure (HCC)    Acute hypoxemic respiratory failure secondary to atypical pneumonia -Check procalcitonin and maintain on Rocephin and azithromycin -Wean oxygen as tolerated, but can likely require oxygen on discharge -COVID testing negative -Noted to have some leukocytosis  History of CAD/hypertension/dyslipidemia -Continue home medications -Careful monitoring of blood pressure while on metoprolol  History of COPD -Currently with hypoxemia as noted above -Breathing treatments will be scheduled with Xopenex  History of diastolic congestive heart failure -Appears to be dry -Minimal BNP elevation with no signs of volume overload -Okay to give gentle IV fluid for now -Monitor daily weights  History of atrial fibrillation -Currently rate controlled -Continue anticoagulation with Eliquis -Continue Lopressor for heart rate control with holding parameters for soft bp  CKD stage IIIb -Mild creatinine elevation currently noted -Hydrate with IV fluid -Monitor repeat labs in a.m.   DVT prophylaxis: Eliquis Code Status: Full Family Communication: Discussed with daughter on phone 7/14 Disposition Plan:Admit for treatment of pneumonia Consults  called:None Admission status: Inpatient, Tele  [facility]  If 7PM-7AM, please contact night-coverage [email or web address]  04/19/2021, 2:49 PM

## 2021-04-19 NOTE — ED Notes (Signed)
BC's were ordered after IV antibiotics given

## 2021-04-19 NOTE — ED Notes (Signed)
Assisted pt into a gown

## 2021-04-19 NOTE — ED Triage Notes (Signed)
Pt called EMS for shortness of breath, Pt states she is coughing, had a coughing spell then vomited. Has had weakness for 3 a week. Pt has history of COPD, CHF. Py vitals: heart rate 110 afib (history of afib)

## 2021-04-19 NOTE — Plan of Care (Signed)

## 2021-04-19 NOTE — ED Provider Notes (Addendum)
[facility] EMERGENCY DEPARTMENT Provider Note   CSN: [id number] Arrival date & time: 04/19/21  1031     History Chief Complaint  Patient presents with   Shortness of Breath    Laura Jacobs is a 87 y.o. female.  Patient brought in by EMS.  Brought in for shortness of breath coughing had vomiting with coughing.  Patient's had weakness for 3 weeks.  Has a history of COPD CHF hypertension coronary artery disease looks like there is a history of aortic stenosis.  Chronic kidney disease stage IV.  The congestive heart failure seems to be chronic diastolic heart failure.  Patient is on Eliquis for atrial fibrillation.      Past Medical History:  Diagnosis Date   Aortic stenosis    Moderate by echo >>low flow low gradient preserved EF (SVI < 35 and DI 0.34) by echo 02/2021   CAD (coronary artery disease)    s/p inferior MI 1998 with subsequent cath showing 50% stenosis ostial Left main, 60% stenosis proximal LAD, 90% stenosis mid LAD, occluded mid CFX, 70% stenosis proximal RCA, 80% stenosis ostial PDA, occluded OM 2 and underwent CABG w LIMA to D1-D2-LAD, SVG to OM-circ, SVG to PD-PL 05/23/97.   Chronic atrial fibrillation (HCC)    Chronic diastolic CHF (congestive heart failure) (HCC)    CKD (chronic kidney disease), stage III (HCC)    COPD (chronic obstructive pulmonary disease) (HCC)    Gout    Gout    Hyperlipidemia    Hypertension    Pneumonia    Renal cysts and diabetes syndrome (HCC)    Valvular heart disease     Patient Active Problem List   Diagnosis Date Noted   COPD (chronic obstructive pulmonary disease) ([place]) 03/08/2021   Aortic stenosis    Chronic atrial fibrillation (HCC)    Chronic systolic CHF (congestive heart failure) ([place]) 02/09/2020   Osteoporosis 02/27/2015   Low serum vitamin D 02/27/2015   Paroxysmal atrial fibrillation ([place])    Long term current use of anticoagulant therapy    Lung nodule 08/23/2014   CAD (coronary artery disease)  08/11/2013   Gout 05/10/2013   Hyperlipidemia 02/04/2013   Chronic kidney disease (CKD), stage IV (severe) ([place]) 02/04/2013   Hypertensive heart disease      Past Surgical History:  Procedure Laterality Date   heart by pass       OB History   No obstetric history on file.     Family History  Problem Relation Age of Onset   Heart disease Mother    Alcohol abuse Father    Alzheimer's disease Sister    Cancer Sister     Social History   Tobacco Use   Smoking status: Former    Packs/day: 0.50    Years: 60.00    Pack years: 30.00    Types: Cigarettes    Quit date: 08/07/2014    Years since quitting: 6.7   Smokeless tobacco: Former    Quit date: 05/11/2014  Vaping Use   Vaping Use: Never used  Substance Use Topics   Alcohol use: No   Drug use: No    Home Medications Prior to Admission medications   Medication Sig Start Date End Date Taking? Authorizing Provider  albuterol (PROVENTIL) (2.5 MG/3ML) 0.083% nebulizer solution Take 3 mLs (2.5 mg total) by nebulization every 2 (two) hours as needed for wheezing or shortness of breath. 03/27/20   Denton Brick, Courage, MD  albuterol (VENTOLIN HFA) 108 (90 Base) MCG/ACT inhaler  Inhale 2 puffs into the lungs every 6 (six) hours as needed. 03/27/20   Roxan Hockey, MD  apixaban (ELIQUIS) 2.5 MG TABS tablet Take 1 tablet (2.5 mg total) by mouth 2 (two) times daily. 03/08/21   [name], FNP  atorvastatin (LIPITOR) 40 MG tablet TAKE 1 TABLET ONCE DAILY IN THE EVENING 03/08/21   Hassell Done, Mary-Margaret, FNP  calcium-vitamin D (OSCAL 500/200 D-3) 500-200 MG-UNIT per tablet Take 1 tablet by mouth 2 (two) times daily. 02/27/15   Cherre Robins, PharmD  citalopram (CELEXA) 20 MG tablet Take 1 tablet (20 mg total) by mouth daily. 03/08/21   Hassell Done, Mary-Margaret, FNP  ezetimibe (ZETIA) 10 MG tablet Take 1 tablet (10 mg total) by mouth daily. 03/08/21   Hassell Done, Mary-Margaret, FNP  Fluticasone-Umeclidin-Vilant (TRELEGY ELLIPTA) 100-62.5-25  MCG/INH AEPB Inhale 1 puff into the lungs daily. 03/08/21   Hassell Done Mary-Margaret, FNP  furosemide (LASIX) 40 MG tablet Take 1 tablet (40 mg total) by mouth 2 (two) times daily. 01/15/21   Sueanne Margarita, MD  ipratropium-albuterol (DUONEB) 0.5-2.5 (3) MG/3ML SOLN Take 3 mLs by nebulization 2 (two) times daily. 03/27/20   Roxan Hockey, MD  metoprolol tartrate (LOPRESSOR) 25 MG tablet TAKE  (1)  TABLET TWICE A DAY. 03/08/21   Hassell Done, Mary-Margaret, FNP  mirtazapine (REMERON) 7.5 MG tablet Take 1 tablet (7.5 mg total) by mouth at bedtime. 03/08/21   [name], FNP  Nebulizers (COMPRESSOR/NEBULIZER) MISC 1 Units by Does not apply route 3 times/day as needed-between meals & bedtime. -Nebulizer machine with tubing supplies to be used for administering nebulized medications as prescribed for COPD 03/27/20   Roxan Hockey, MD  nitroGLYCERIN (NITROSTAT) 0.4 MG SL tablet PLACE 1 TABLET UNDER THE TONGUE EVERY 5 MINUTES AS NEEDED FOR CHEST PA IN 11/29/20   [name], FNP  potassium chloride SA (KLOR-CON) 20 MEQ tablet TAKE  (1)  TABLET TWICE A DAY. 03/08/21   [name], FNP    Allergies    Patient has no known allergies.  Review of Systems   Review of Systems  Constitutional:  Negative for chills and fever.  HENT:  Negative for ear pain and sore throat.   Eyes:  Negative for pain and visual disturbance.  Respiratory:  Positive for cough and shortness of breath.   Cardiovascular:  Negative for chest pain and palpitations.  Gastrointestinal:  Positive for vomiting. Negative for abdominal pain.  Genitourinary:  Negative for dysuria and hematuria.  Musculoskeletal:  Negative for arthralgias and back pain.  Skin:  Negative for color change and rash.  Neurological:  Negative for seizures and syncope.  All other systems reviewed and are negative.  Physical Exam Updated Vital Signs BP (!) 92/59   Pulse (!) 102   Temp 98.6 F (37 C) (Oral)   Resp (!) 23   Ht 1.702 m (5'  7")   Wt 56.7 kg   SpO2 100%   BMI 19.58 kg/m   Physical Exam Constitutional:      General: She is not in acute distress.    Appearance: She is ill-appearing.     Comments: Very thin.  HENT:     Mouth/Throat:     Mouth: Mucous membranes are dry.  Eyes:     Extraocular Movements: Extraocular movements intact.     Conjunctiva/sclera: Conjunctivae normal.     Pupils: Pupils are equal, round, and reactive to light.  Cardiovascular:     Rate and Rhythm: Tachycardia present. Rhythm irregular.  Pulmonary:     Breath sounds:  No wheezing, rhonchi or rales.  Chest:     Chest wall: No tenderness.  Musculoskeletal:        General: No swelling. Normal range of motion.     Cervical back: Normal range of motion.     Right lower leg: No edema.     Left lower leg: No edema.  Neurological:     General: No focal deficit present.     Mental Status: She is alert.     Comments: Some degree of confusion.  Able to move all extremities.    ED Results / Procedures / Treatments   Labs (all labs ordered are listed, but only abnormal results are displayed) Labs Reviewed  COMPREHENSIVE METABOLIC PANEL - Abnormal; Notable for the following components:      Result Value   Sodium 134 (*)    Creatinine, Ser 2.26 (*)    Calcium 8.1 (*)    Total Protein 6.4 (*)    Albumin 2.9 (*)    AST 14 (*)    Total Bilirubin 1.4 (*)    GFR, Estimated 21 (*)    All other components within normal limits  CBC WITH DIFFERENTIAL/PLATELET - Abnormal; Notable for the following components:   WBC 11.9 (*)    RBC 3.45 (*)    Hemoglobin 10.3 (*)    HCT 31.9 (*)    Neutro Abs 10.1 (*)    Lymphs Abs 0.4 (*)    Monocytes Absolute 1.2 (*)    Abs Immature Granulocytes 0.09 (*)    All other components within normal limits  BRAIN NATRIURETIC PEPTIDE - Abnormal; Notable for the following components:   B Natriuretic Peptide 389.0 (*)    All other components within normal limits  RESP PANEL BY RT-PCR (FLU A&B, COVID)  ARPGX2  CULTURE, BLOOD (ROUTINE X 2)  CULTURE, BLOOD (ROUTINE X 2)  LACTIC ACID, PLASMA    EKG EKG Interpretation  Date/Time:  Thursday April 19 2021 10:44:37 EDT Ventricular Rate:  129 PR Interval:    QRS Duration: 93 QT Interval:  324 QTC Calculation: 475 R Axis:   73 Text Interpretation: Atrial flutter with predominant 2:1 AV block Low voltage, extremity and precordial leads Repolarization abnormality, prob rate related Suspect atrial fibrillation. Confirmed by Fredia Sorrow [phone]) on 04/19/2021 11:04:19 AM  Radiology DG Chest Port 1 View  Result Date: 04/19/2021 CLINICAL DATA:  Shortness of breath EXAM: PORTABLE CHEST 1 VIEW COMPARISON:  02/06/2021 FINDINGS: Post CABG changes. Stable cardiomegaly. Atherosclerotic calcification of the aortic knob. Chronically coarsened interstitial markings with new patchy airspace opacities within the left mid to lower lung. No pleural effusion or pneumothorax. IMPRESSION: New patchy airspace opacities within the left mid to lower lung concerning for multifocal atypical/viral infection. Electronically Signed   By: Davina Poke D.O.   On: 04/19/2021 11:46    Procedures Procedures   CRITICAL CARE Performed by: Fredia Sorrow Total critical care time: 45 minutes Critical care time was exclusive of separately billable procedures and treating other patients. Critical care was necessary to treat or prevent imminent or life-threatening deterioration. Critical care was time spent personally by me on the following activities: development of treatment plan with patient and/or surrogate as well as nursing, discussions with consultants, evaluation of patient's response to treatment, examination of patient, obtaining history from patient or surrogate, ordering and performing treatments and interventions, ordering and review of laboratory studies, ordering and review of radiographic studies, pulse oximetry and re-evaluation of patient's  condition.   Medications Ordered in ED  Medications  azithromycin (ZITHROMAX) 500 mg in sodium chloride 0.9 % 250 mL IVPB (500 mg Intravenous New Bag/Given 04/19/21 1411)  0.9 %  sodium chloride infusion (has no administration in time range)  sodium chloride 0.9 % bolus 500 mL (has no administration in time range)  cefTRIAXone (ROCEPHIN) 1 g in sodium chloride 0.9 % 100 mL IVPB (0 g Intravenous Stopped 04/19/21 1412)    ED Course  I have reviewed the triage vital signs and the nursing notes.  Pertinent labs & imaging results that were available during my care of the patient were reviewed by me and considered in my medical decision making (see chart for details).    MDM Rules/Calculators/A&P                          Work-up here initially no fever.  Blood pressure 616 systolic.  Was tachycardic EKG consistent with atrial fibrillation.  Patient on Eliquis.  And increased respiratory rate.  Patient on oxygen.  Not able to discern whether she is normally on oxygen at home.  Oxygen sats 96%.  Lungs clear bilaterally no wheezing.  Chest x-ray however though shows left-sided pneumonia.  Since she did have the vomiting possibly could be aspiration.  COVID was negative.  Blood cultures and lactic acid are pending.  Electrolytes without significant abnormalities.  CBC with a mild leukocytosis white blood cell count 11.9.  Hemoglobin 10.3.  Patient's BNP 389.  However patient's mucous membranes appear dry.  Chest x-ray showed no evidence of pulmonary edema.  Patient does have a history of congestive heart failure.  Patient's blood pressures here have been soft.  Sometimes 92 sometimes upper 80s.  Most recently 60 systolic.  Heart rates improved down to around 110.  Giving gentle fluid resuscitation 500 cc bolus and 75 cc an hour.  For the pneumonia patient received Rocephin and IV Zithromax.  Patient EKG consistent with atrial fibrillation.  Computer read it as atrial flutter 2-1.  But I think it is  consistent with atrial fib.  Known to have chronic renal disease.  Slight worsening of her creatinine compared to baseline but not significant.  Patient did not meet sepsis criteria and that there was no fever.  Could have met it based on the tachycardia and the increased respiratory rate.  The patient did have labs drawn to cover that.  Patient not a good candidate for the 30 cc/kg fluid challenge.  And patient has been mentating fine.  Seems to be baseline for her.   Final Clinical Impression(s) / ED Diagnoses Final diagnoses:  Community acquired pneumonia of left lung, unspecified part of lung  Atrial fibrillation, unspecified type [facility])    Rx / DC Orders ED Discharge Orders     None        Fredia Sorrow, MD 04/19/21 1455    Fredia Sorrow, MD 04/19/21 1456

## 2021-04-20 DIAGNOSIS — J9601 Acute respiratory failure with hypoxia: Secondary | ICD-10-CM | POA: Diagnosis not present

## 2021-04-20 LAB — COMPREHENSIVE METABOLIC PANEL
ALT: 7 U/L (ref 0–44)
AST: 7 U/L — ABNORMAL LOW (ref 15–41)
Albumin: 2.2 g/dL — ABNORMAL LOW (ref 3.5–5.0)
Alkaline Phosphatase: 72 U/L (ref 38–126)
Anion gap: 8 (ref 5–15)
BUN: 20 mg/dL (ref 8–23)
CO2: 25 mmol/L (ref 22–32)
Calcium: 7.7 mg/dL — ABNORMAL LOW (ref 8.9–10.3)
Chloride: 106 mmol/L (ref 98–111)
Creatinine, Ser: 1.84 mg/dL — ABNORMAL HIGH (ref 0.44–1.00)
GFR, Estimated: 27 mL/min — ABNORMAL LOW (ref >60)
Glucose, Bld: 83 mg/dL (ref 70–99)
Potassium: 4 mmol/L (ref 3.5–5.1)
Sodium: 139 mmol/L (ref 135–145)
Total Bilirubin: 0.8 mg/dL (ref 0.3–1.2)
Total Protein: 5 g/dL — ABNORMAL LOW (ref 6.5–8.1)

## 2021-04-20 LAB — CBC
HCT: 27.3 % — ABNORMAL LOW (ref 36.0–46.0)
Hemoglobin: 8.5 g/dL — ABNORMAL LOW (ref 12.0–15.0)
MCH: 29.5 pg (ref 26.0–34.0)
MCHC: 31.1 g/dL (ref 30.0–36.0)
MCV: 94.8 fL (ref 80.0–100.0)
Platelets: 174 10*3/uL (ref 150–400)
RBC: 2.88 MIL/uL — ABNORMAL LOW (ref 3.87–5.11)
RDW: 15.3 % (ref 11.5–15.5)
WBC: 7.8 10*3/uL (ref 4.0–10.5)
nRBC: 0 % (ref 0.0–0.2)

## 2021-04-20 LAB — VITAMIN B12: Vitamin B-12: 239 pg/mL (ref 180–914)

## 2021-04-20 LAB — HEMOGLOBIN AND HEMATOCRIT, BLOOD
HCT: 29.9 % — ABNORMAL LOW (ref 36.0–46.0)
Hemoglobin: 9.1 g/dL — ABNORMAL LOW (ref 12.0–15.0)

## 2021-04-20 LAB — RETICULOCYTES
Immature Retic Fract: 15.5 % (ref 2.3–15.9)
RBC.: 3.19 MIL/uL — ABNORMAL LOW (ref 3.87–5.11)
Retic Count, Absolute: 82.9 10*3/uL (ref 19.0–186.0)
Retic Ct Pct: 2.6 % (ref 0.4–3.1)

## 2021-04-20 LAB — MAGNESIUM: Magnesium: 1.2 mg/dL — ABNORMAL LOW (ref 1.7–2.4)

## 2021-04-20 LAB — IRON AND TIBC
Iron: 32 ug/dL (ref 28–170)
Saturation Ratios: 15 % (ref 10.4–31.8)
TIBC: 213 ug/dL — ABNORMAL LOW (ref 250–450)
UIBC: 181 ug/dL

## 2021-04-20 LAB — FERRITIN: Ferritin: 161 ng/mL (ref 11–307)

## 2021-04-20 LAB — FOLATE: Folate: 4.2 ng/mL — ABNORMAL LOW (ref >5.9)

## 2021-04-20 MED ORDER — AMOXICILLIN-POT CLAVULANATE 875-125 MG PO TABS: 1.0000 | ORAL_TABLET | Freq: Two times a day (BID) | ORAL | 0 refills | Status: AC

## 2021-04-20 MED ORDER — MAGNESIUM SULFATE 2 GM/50ML IV SOLN
2.0000 g | Freq: Once | INTRAVENOUS | Status: AC
  Administered 2021-04-20: 2 g via INTRAVENOUS
  Filled 2021-04-20: qty 50

## 2021-04-20 NOTE — Care Management Obs Status (Signed)
[place] NOTIFICATION   Patient Details  Name: Laura Jacobs MRN: [id number] Date of Birth: Mar 09, 1934   Medicare Observation Status Notification Given:  Yes    Natasha Bence, LCSW 04/20/2021, 12:22 PM

## 2021-04-20 NOTE — TOC Transition Note (Signed)
Transition of Care [facility]) - CM/SW Discharge Note   Patient Details  Name: MICALA SALTSMAN MRN: [id number] Date of Birth: June 10, 1934  [facility]) CM/SW Contact:  Natasha Bence, LCSW Phone Number: 04/20/2021, 11:42 AM   Clinical Narrative:    CSW notified of patient's O2 needs and readiness for discharge. CSW placed order for O2 with Shelia from [employer]. Shelia agreeable to provide O2. TOC signing off.   Final next level of care: Home/Self Care Barriers to Discharge: Barriers Resolved   Patient Goals and CMS Choice Patient states their goals for this hospitalization and ongoing recovery are:: Return home CMS Medicare.gov Compare Post Acute Care list provided to:: Patient Choice offered to / list presented to : Patient  Discharge Placement                    Patient and family notified of of transfer: 04/20/21  Discharge Plan and Services                DME Arranged: Oxygen (2L) DME Agency: [employer] Date DME Agency Contacted: 04/20/21 Time DME Agency Contacted: [phone] Representative spoke with at DME Agency: Adela Lank            Social Determinants of Health ([place]) Interventions     Readmission Risk Interventions No flowsheet data found.

## 2021-04-20 NOTE — Discharge Summary (Signed)
Physician Discharge Summary  ATTIE NAWABI NAT:[id number] DOB: Jan 05, 1934 DOA: 04/19/2021  PCP: [name], FNP  Admit date: 04/19/2021  Discharge date: 04/20/2021  Admitted From:Home  Disposition:  Home  Recommendations for Outpatient Follow-up:  Follow up with PCP in 1-2 weeks Continue on Augmentin as prescribed for the next 6 days to complete course of treatment for pneumonia Continue on other home medications as prior  Home Health: None  Equipment/Devices: Home 2 L nasal cannula oxygen  Discharge Condition:Stable  CODE STATUS: Full  Diet recommendation: Heart Healthy  Brief/Interim Summary:  Laura Jacobs is a 87 y.o. female with medical history significant for chronic atrial fibrillation on Eliquis, chronic diastolic CHF, hypertension, CAD, COPD, and CKD stage IV who presented to the ED with worsening shortness of breath.  She was admitted with mild acute hypoxemic respiratory failure secondary to atypical pneumonia and was started on Rocephin and azithromycin empirically.  She had no significant respiratory distress and was started on 2 L nasal cannula oxygen.  She was overall doing quite well and had COVID testing returned negative.  Overnight she has remained stable and is now able to remain on room air at rest, but is requiring some oxygen with ambulation and therefore she will have home oxygen arranged on discharge.  She will be discharged with Augmentin to continue taking for approximately 6 more days to finish a 7-day course of treatment.  No other acute events were noted throughout the course of this hospitalization.  She is overall stable for discharge with home oxygen.  Discharge Diagnoses:  Active Problems:   Acute hypoxemic respiratory failure ([place]) CKD IV  Principal discharge diagnosis: Acute hypoxemic respiratory failure secondary to atypical pneumonia in the setting of COPD.  Discharge Instructions  Discharge Instructions     Diet -  low sodium heart healthy   Complete by: As directed    Increase activity slowly   Complete by: As directed       Allergies as of 04/20/2021   No Known Allergies      Medication List     TAKE these medications    albuterol 108 (90 Base) MCG/ACT inhaler Commonly known as: VENTOLIN HFA Inhale 2 puffs into the lungs every 6 (six) hours as needed.   albuterol (2.5 MG/3ML) 0.083% nebulizer solution Commonly known as: PROVENTIL Take 3 mLs (2.5 mg total) by nebulization every 2 (two) hours as needed for wheezing or shortness of breath.   amoxicillin-clavulanate 875-125 MG tablet Commonly known as: Augmentin Take 1 tablet by mouth 2 (two) times daily for 6 days.   apixaban 2.5 MG Tabs tablet Commonly known as: Eliquis Take 1 tablet (2.5 mg total) by mouth 2 (two) times daily.   atorvastatin 40 MG tablet Commonly known as: LIPITOR TAKE 1 TABLET ONCE DAILY IN THE EVENING What changed:  how much to take how to take this when to take this additional instructions   calcium-vitamin D 500-200 MG-UNIT tablet Commonly known as: Oscal 500/200 D-3 Take 1 tablet by mouth 2 (two) times daily.   citalopram 20 MG tablet Commonly known as: CeleXA Take 1 tablet (20 mg total) by mouth daily.   Compressor/Nebulizer Misc 1 Units by Does not apply route 3 times/day as needed-between meals & bedtime. -Nebulizer machine with tubing supplies to be used for administering nebulized medications as prescribed for COPD   ezetimibe 10 MG tablet Commonly known as: ZETIA Take 1 tablet (10 mg total) by mouth daily.   furosemide 40 MG tablet Commonly  known as: LASIX Take 1 tablet (40 mg total) by mouth 2 (two) times daily.   ipratropium-albuterol 0.5-2.5 (3) MG/3ML Soln Commonly known as: DUONEB Take 3 mLs by nebulization 2 (two) times daily.   metoprolol tartrate 25 MG tablet Commonly known as: LOPRESSOR TAKE  (1)  TABLET TWICE A DAY. What changed:  how much to take how to take this when  to take this additional instructions   mirtazapine 7.5 MG tablet Commonly known as: REMERON Take 1 tablet (7.5 mg total) by mouth at bedtime.   nitroGLYCERIN 0.4 MG SL tablet Commonly known as: Nitrostat PLACE 1 TABLET UNDER THE TONGUE EVERY 5 MINUTES AS NEEDED FOR CHEST PA IN What changed:  how much to take how to take this when to take this reasons to take this   potassium chloride SA 20 MEQ tablet Commonly known as: KLOR-CON TAKE  (1)  TABLET TWICE A DAY. What changed:  how much to take how to take this when to take this additional instructions   Trelegy Ellipta 100-62.5-25 MCG/INH Aepb Generic drug: Fluticasone-Umeclidin-Vilant Inhale 1 puff into the lungs daily.               Durable Medical Equipment  (From admission, onward)           Start     Ordered   04/20/21 1109  For home use only DME oxygen  Once       Question Answer Comment  Length of Need Lifetime   Mode or (Route) Nasal cannula   Liters per Minute 2   Frequency Continuous (stationary and portable oxygen unit needed)   Oxygen conserving device Yes   Oxygen delivery system Gas      04/20/21 1109            Follow-up Information     [name], FNP. Schedule an appointment as soon as possible for a visit in 1 week(s).   Specialty: Family Medicine Contact information: [place] [address] [phone]                No Known Allergies  Consultations: None   Procedures/Studies: DG Chest Port 1 View  Result Date: 04/19/2021 CLINICAL DATA:  Shortness of breath EXAM: PORTABLE CHEST 1 VIEW COMPARISON:  02/06/2021 FINDINGS: Post CABG changes. Stable cardiomegaly. Atherosclerotic calcification of the aortic knob. Chronically coarsened interstitial markings with new patchy airspace opacities within the left mid to lower lung. No pleural effusion or pneumothorax. IMPRESSION: New patchy airspace opacities within the left mid to lower lung  concerning for multifocal atypical/viral infection. Electronically Signed   By: Davina Poke D.O.   On: 04/19/2021 11:46     Discharge Exam: Vitals:   04/20/21 0616 04/20/21 0830  BP: 112/70 114/63  Pulse: 82 (!) 110  Resp: 16 16  Temp: 97.7 F (36.5 C)   SpO2: 100% 95%   Vitals:   04/19/21 1818 04/19/21 2132 04/20/21 0616 04/20/21 0830  BP: 94/64 (!) 88/66 112/70 114/63  Pulse: 96 88 82 (!) 110  Resp: 18 16 16 16   Temp: 97.8 F (36.6 C) 97.8 F (36.6 C) 97.7 F (36.5 C)   TempSrc: Oral Oral Oral   SpO2: 100% 100% 100% 95%  Weight:      Height:        General: Pt is alert, awake, not in acute distress Cardiovascular: RRR, S1/S2 +, no rubs, no gallops Respiratory: CTA bilaterally, no wheezing, no rhonchi, on 2 L nasal cannula oxygen Abdominal:  Soft, NT, ND, bowel sounds + Extremities: no edema, no cyanosis    The results of significant diagnostics from this hospitalization (including imaging, microbiology, ancillary and laboratory) are listed below for reference.     Microbiology: Recent Results (from the past 240 hour(s))  Resp Panel by RT-PCR (Flu A&B, Covid) Nasopharyngeal Swab     Status: None   Collection Time: 04/19/21 11:15 AM   Specimen: Nasopharyngeal Swab; Nasopharyngeal(NP) swabs in vial transport medium  Result Value Ref Range Status   SARS Coronavirus 2 by RT PCR NEGATIVE NEGATIVE Final    Comment: (NOTE) SARS-CoV-2 target nucleic acids are NOT DETECTED.  The SARS-CoV-2 RNA is generally detectable in upper respiratory specimens during the acute phase of infection. The lowest concentration of SARS-CoV-2 viral copies this assay can detect is 138 copies/mL. A negative result does not preclude SARS-Cov-2 infection and should not be used as the sole basis for treatment or other patient management decisions. A negative result may occur with  improper specimen collection/handling, submission of specimen other than nasopharyngeal swab, presence of viral  mutation(s) within the areas targeted by this assay, and inadequate number of viral copies(<138 copies/mL). A negative result must be combined with clinical observations, patient history, and epidemiological information. The expected result is Negative.  Fact Sheet for Patients:  EntrepreneurPulse.com.au  Fact Sheet for Healthcare Providers:  IncredibleEmployment.be  This test is no t yet approved or cleared by the [place] FDA and  has been authorized for detection and/or diagnosis of SARS-CoV-2 by FDA under an Emergency Use Authorization (EUA). This EUA will remain  in effect (meaning this test can be used) for the duration of the COVID-19 declaration under Section 564(b)(1) of the Act, 21 U.S.C.section 360bbb-3(b)(1), unless the authorization is terminated  or revoked sooner.       Influenza A by PCR NEGATIVE NEGATIVE Final   Influenza B by PCR NEGATIVE NEGATIVE Final    Comment: (NOTE) The Xpert Xpress SARS-CoV-2/FLU/RSV plus assay is intended as an aid in the diagnosis of influenza from Nasopharyngeal swab specimens and should not be used as a sole basis for treatment. Nasal washings and aspirates are unacceptable for Xpert Xpress SARS-CoV-2/FLU/RSV testing.  Fact Sheet for Patients: EntrepreneurPulse.com.au  Fact Sheet for Healthcare Providers: IncredibleEmployment.be  This test is not yet approved or cleared by the [place] FDA and has been authorized for detection and/or diagnosis of SARS-CoV-2 by FDA under an Emergency Use Authorization (EUA). This EUA will remain in effect (meaning this test can be used) for the duration of the COVID-19 declaration under Section 564(b)(1) of the Act, 21 U.S.C. section 360bbb-3(b)(1), unless the authorization is terminated or revoked.  Performed at [facility], [address] [place], [place] [address]   Culture, blood (Routine X 2) w Reflex to ID  Panel     Status: None (Preliminary result)   Collection Time: 04/19/21  2:57 PM   Specimen: Left Antecubital; Blood  Result Value Ref Range Status   Specimen Description   Final    LEFT ANTECUBITAL BOTTLES DRAWN AEROBIC AND ANAEROBIC   Special Requests   Final    Blood Culture adequate volume Performed at [facility], [address], [place], [place] [address]    Culture PENDING  Incomplete   Report Status PENDING  Incomplete  Culture, blood (Routine X 2) w Reflex to ID Panel     Status: None (Preliminary result)   Collection Time: 04/19/21  2:57 PM   Specimen: BLOOD RIGHT HAND  Result Value  Ref Range Status   Specimen Description   Final    BLOOD RIGHT HAND BOTTLES DRAWN AEROBIC AND ANAEROBIC   Special Requests   Final    Blood Culture adequate volume Performed at [facility], [address], [place], [place] [address]    Culture PENDING  Incomplete   Report Status PENDING  Incomplete     Labs: BNP (last 3 results) Recent Labs    04/19/21 1150  BNP 703.5*   Basic Metabolic Panel: Recent Labs  Lab 04/19/21 1150 04/20/21 0537  NA 134* 139  K 3.6 4.0  CL 101 106  CO2 25 25  GLUCOSE 98 83  BUN 21 20  CREATININE 2.26* 1.84*  CALCIUM 8.1* 7.7*  MG  --  1.2*   Liver Function Tests: Recent Labs  Lab 04/19/21 1150 04/20/21 0537  AST 14* 7*  ALT 8 7  ALKPHOS 87 72  BILITOT 1.4* 0.8  PROT 6.4* 5.0*  ALBUMIN 2.9* 2.2*   No results for input(s): LIPASE, AMYLASE in the last 168 hours. No results for input(s): AMMONIA in the last 168 hours. CBC: Recent Labs  Lab 04/19/21 1150 04/20/21 0537 04/20/21 1054  WBC 11.9* 7.8  --   NEUTROABS 10.1*  --   --   HGB 10.3* 8.5* 9.1*  HCT 31.9* 27.3* 29.9*  MCV 92.5 94.8  --   PLT 211 174  --    Cardiac Enzymes: No results for input(s): CKTOTAL, CKMB, CKMBINDEX, TROPONINI in the last 168 hours. BNP: Invalid input(s): POCBNP CBG: No results for input(s): GLUCAP in the last 168 hours. D-Dimer No results for  input(s): DDIMER in the last 72 hours. Hgb A1c No results for input(s): HGBA1C in the last 72 hours. Lipid Profile No results for input(s): CHOL, HDL, LDLCALC, TRIG, CHOLHDL, LDLDIRECT in the last 72 hours. Thyroid function studies No results for input(s): TSH, T4TOTAL, T3FREE, THYROIDAB in the last 72 hours.  Invalid input(s): FREET3 Anemia work up Recent Labs    04/20/21 0824  VITAMINB12 239  FOLATE 4.2*  FERRITIN 161  TIBC 213*  IRON 32  RETICCTPCT 2.6   Urinalysis    Component Value Date/Time   COLORURINE YELLOW 02/09/2020 1530   APPEARANCEUR HAZY (A) 02/09/2020 1530   LABSPEC 1.013 02/09/2020 1530   PHURINE 5.0 02/09/2020 1530   GLUCOSEU NEGATIVE 02/09/2020 1530   HGBUR MODERATE (A) 02/09/2020 [place] 02/09/2020 1530   BILIRUBINUR NEG 01/05/2013 1106   KETONESUR NEGATIVE 02/09/2020 1530   PROTEINUR NEGATIVE 02/09/2020 1530   UROBILINOGEN 1.0 08/23/2014 0001   NITRITE NEGATIVE 02/09/2020 1530   LEUKOCYTESUR LARGE (A) 02/09/2020 1530   Sepsis Labs Invalid input(s): PROCALCITONIN,  WBC,  LACTICIDVEN Microbiology Recent Results (from the past 240 hour(s))  Resp Panel by RT-PCR (Flu A&B, Covid) Nasopharyngeal Swab     Status: None   Collection Time: 04/19/21 11:15 AM   Specimen: Nasopharyngeal Swab; Nasopharyngeal(NP) swabs in vial transport medium  Result Value Ref Range Status   SARS Coronavirus 2 by RT PCR NEGATIVE NEGATIVE Final    Comment: (NOTE) SARS-CoV-2 target nucleic acids are NOT DETECTED.  The SARS-CoV-2 RNA is generally detectable in upper respiratory specimens during the acute phase of infection. The lowest concentration of SARS-CoV-2 viral copies this assay can detect is 138 copies/mL. A negative result does not preclude SARS-Cov-2 infection and should not be used as the sole basis for treatment or other patient management decisions. A negative result may occur with  improper specimen collection/handling, submission  of specimen  other than nasopharyngeal swab, presence of viral mutation(s) within the areas targeted by this assay, and inadequate number of viral copies(<138 copies/mL). A negative result must be combined with clinical observations, patient history, and epidemiological information. The expected result is Negative.  Fact Sheet for Patients:  EntrepreneurPulse.com.au  Fact Sheet for Healthcare Providers:  IncredibleEmployment.be  This test is no t yet approved or cleared by the [place] FDA and  has been authorized for detection and/or diagnosis of SARS-CoV-2 by FDA under an Emergency Use Authorization (EUA). This EUA will remain  in effect (meaning this test can be used) for the duration of the COVID-19 declaration under Section 564(b)(1) of the Act, 21 U.S.C.section 360bbb-3(b)(1), unless the authorization is terminated  or revoked sooner.       Influenza A by PCR NEGATIVE NEGATIVE Final   Influenza B by PCR NEGATIVE NEGATIVE Final    Comment: (NOTE) The Xpert Xpress SARS-CoV-2/FLU/RSV plus assay is intended as an aid in the diagnosis of influenza from Nasopharyngeal swab specimens and should not be used as a sole basis for treatment. Nasal washings and aspirates are unacceptable for Xpert Xpress SARS-CoV-2/FLU/RSV testing.  Fact Sheet for Patients: EntrepreneurPulse.com.au  Fact Sheet for Healthcare Providers: IncredibleEmployment.be  This test is not yet approved or cleared by the [place] FDA and has been authorized for detection and/or diagnosis of SARS-CoV-2 by FDA under an Emergency Use Authorization (EUA). This EUA will remain in effect (meaning this test can be used) for the duration of the COVID-19 declaration under Section 564(b)(1) of the Act, 21 U.S.C. section 360bbb-3(b)(1), unless the authorization is terminated or revoked.  Performed at [facility], [address], [place], [place]  [address]   Culture, blood (Routine X 2) w Reflex to ID Panel     Status: None (Preliminary result)   Collection Time: 04/19/21  2:57 PM   Specimen: Left Antecubital; Blood  Result Value Ref Range Status   Specimen Description   Final    LEFT ANTECUBITAL BOTTLES DRAWN AEROBIC AND ANAEROBIC   Special Requests   Final    Blood Culture adequate volume Performed at [facility] [address], [place], [place] [address]    Culture PENDING  Incomplete   Report Status PENDING  Incomplete  Culture, blood (Routine X 2) w Reflex to ID Panel     Status: None (Preliminary result)   Collection Time: 04/19/21  2:57 PM   Specimen: BLOOD RIGHT HAND  Result Value Ref Range Status   Specimen Description   Final    BLOOD RIGHT HAND BOTTLES DRAWN AEROBIC AND ANAEROBIC   Special Requests   Final    Blood Culture adequate volume Performed at [facility], [address], [place], [place] [address]    Culture PENDING  Incomplete   Report Status PENDING  Incomplete     Time coordinating discharge: 35 minutes  SIGNED:   Rodena Goldmann, DO [facility] 04/20/2021, 11:57 AM  If 7PM-7AM, please contact night-coverage [email or web address]

## 2021-04-20 NOTE — Progress Notes (Signed)
SATURATION QUALIFICATIONS: (This note is used to comply with regulatory documentation for home oxygen)  Patient Saturations on Room Air at Rest = 97 %  Patient Saturations on Room Air while Ambulating = 83 %  Patient Saturations on 97   Liters of oxygen while Ambulating = 2 %  Please briefly explain why patient needs home oxygen: This patients oxygen dropped below 90% while ambulating.

## 2021-04-20 NOTE — Care Management CC44 (Signed)
Condition Code 44 Documentation Completed  Patient Details  Name: Laura Jacobs MRN: [id number] Date of Birth: 07/17/1934   Condition Code 44 given:  Yes Patient signature on Condition Code 44 notice:  Yes Documentation of 2 MD's agreement:  Yes Code 44 added to claim:  Yes    Natasha Bence, LCSW 04/20/2021, 12:22 PM

## 2021-04-20 NOTE — Progress Notes (Signed)
Nsg Discharge Note  Admit Date:  04/19/2021 Discharge date: 04/20/2021   Dawayne Patricia Babich to be D/C'd Home per MD order.  AVS completed.  Copy for chart, and copy for patient signed, and dated. Patient/caregiver able to verbalize understanding.  Discharge Medication: Allergies as of 04/20/2021   No Known Allergies      Medication List     TAKE these medications    albuterol 108 (90 Base) MCG/ACT inhaler Commonly known as: VENTOLIN HFA Inhale 2 puffs into the lungs every 6 (six) hours as needed.   albuterol (2.5 MG/3ML) 0.083% nebulizer solution Commonly known as: PROVENTIL Take 3 mLs (2.5 mg total) by nebulization every 2 (two) hours as needed for wheezing or shortness of breath.   amoxicillin-clavulanate 875-125 MG tablet Commonly known as: Augmentin Take 1 tablet by mouth 2 (two) times daily for 6 days.   apixaban 2.5 MG Tabs tablet Commonly known as: Eliquis Take 1 tablet (2.5 mg total) by mouth 2 (two) times daily.   atorvastatin 40 MG tablet Commonly known as: LIPITOR TAKE 1 TABLET ONCE DAILY IN THE EVENING What changed:  how much to take how to take this when to take this additional instructions   calcium-vitamin D 500-200 MG-UNIT tablet Commonly known as: Oscal 500/200 D-3 Take 1 tablet by mouth 2 (two) times daily.   citalopram 20 MG tablet Commonly known as: CeleXA Take 1 tablet (20 mg total) by mouth daily.   Compressor/Nebulizer Misc 1 Units by Does not apply route 3 times/day as needed-between meals & bedtime. -Nebulizer machine with tubing supplies to be used for administering nebulized medications as prescribed for COPD   ezetimibe 10 MG tablet Commonly known as: ZETIA Take 1 tablet (10 mg total) by mouth daily.   furosemide 40 MG tablet Commonly known as: LASIX Take 1 tablet (40 mg total) by mouth 2 (two) times daily.   ipratropium-albuterol 0.5-2.5 (3) MG/3ML Soln Commonly known as: DUONEB Take 3 mLs by nebulization 2 (two) times  daily.   metoprolol tartrate 25 MG tablet Commonly known as: LOPRESSOR TAKE  (1)  TABLET TWICE A DAY. What changed:  how much to take how to take this when to take this additional instructions   mirtazapine 7.5 MG tablet Commonly known as: REMERON Take 1 tablet (7.5 mg total) by mouth at bedtime.   nitroGLYCERIN 0.4 MG SL tablet Commonly known as: Nitrostat PLACE 1 TABLET UNDER THE TONGUE EVERY 5 MINUTES AS NEEDED FOR CHEST PA IN What changed:  how much to take how to take this when to take this reasons to take this   potassium chloride SA 20 MEQ tablet Commonly known as: KLOR-CON TAKE  (1)  TABLET TWICE A DAY. What changed:  how much to take how to take this when to take this additional instructions   Trelegy Ellipta 100-62.5-25 MCG/INH Aepb Generic drug: Fluticasone-Umeclidin-Vilant Inhale 1 puff into the lungs daily.               Durable Medical Equipment  (From admission, onward)           Start     Ordered   04/20/21 1109  For home use only DME oxygen  Once       Question Answer Comment  Length of Need Lifetime   Mode or (Route) Nasal cannula   Liters per Minute 2   Frequency Continuous (stationary and portable oxygen unit needed)   Oxygen conserving device Yes   Oxygen delivery system Gas  04/20/21 1109            Discharge Assessment: Vitals:   04/20/21 1304 04/20/21 1316  BP: 98/68   Pulse: 100 98  Resp: 16   Temp: 97.6 F (36.4 C)   SpO2: 100%    Skin clean, dry and intact without evidence of skin break down, no evidence of skin tears noted. IV catheter discontinued intact. Site without signs and symptoms of complications - no redness or edema noted at insertion site, patient denies c/o pain - only slight tenderness at site.  Dressing with slight pressure applied.  D/c Instructions-Education: Discharge instructions given to patient/family with verbalized understanding. D/c education completed with patient/family  including follow up instructions, medication list, d/c activities limitations if indicated, with other d/c instructions as indicated by MD - patient able to verbalize understanding, all questions fully answered. Patient instructed to return to ED, call 911, or call MD for any changes in condition.  Patient escorted via [place], and D/C home via private auto.  Clovis Fredrickson, LPN 9/52/8413 2:44 PM

## 2021-04-23 ENCOUNTER — Telehealth: Payer: Self-pay

## 2021-04-23 NOTE — Telephone Encounter (Signed)
Transition Care Management Follow-up Telephone Call Date of discharge and from where: Laura Jacobs 04/20/21 Diagnosis: pneumonia How have you been since you were released from the hospital? Stable, weak and tired Any questions or concerns? No  Items Reviewed: Did the pt receive and understand the discharge instructions provided? Yes  Medications obtained and verified? Yes  Other? No  Any new allergies since your discharge? No  Dietary orders reviewed? Yes Do you have support at home? Yes   Home Care and Equipment/Supplies: Were home health services ordered? no If so, what is the name of the agency? N/a  Has the agency set up a time to come to the patient's home? not applicable Were any new equipment or medical supplies ordered?  Yes: oxygen What is the name of the medical supply agency? unknown Were you able to get the supplies/equipment? yes Do you have any questions related to the use of the equipment or supplies? No  Functional Questionnaire: (I = Independent and D = Dependent) ADLs: I  Bathing/Dressing- I  Meal Prep- I  Eating- I  Maintaining continence- I  Transferring/Ambulation- I  Managing Meds- I  Follow up appointments reviewed:  PCP Hospital f/u appt confirmed? Yes  Scheduled to see Laura Jacobs on 7/25 @ 11. [facility] f/u appt confirmed? No   Are transportation arrangements needed? No  If their condition worsens, is the pt aware to call PCP or go to the Emergency Dept.? Yes Was the patient provided with contact information for the PCP's office or ED? Yes Was to pt encouraged to call back with questions or concerns? Yes

## 2021-04-24 LAB — CULTURE, BLOOD (ROUTINE X 2)
Culture: NO GROWTH
Culture: NO GROWTH
Special Requests: ADEQUATE
Special Requests: ADEQUATE

## 2021-04-30 ENCOUNTER — Encounter: Payer: Self-pay | Admitting: Nurse Practitioner

## 2021-04-30 ENCOUNTER — Other Ambulatory Visit: Payer: Self-pay

## 2021-04-30 ENCOUNTER — Ambulatory Visit (INDEPENDENT_AMBULATORY_CARE_PROVIDER_SITE_OTHER): Payer: Medicare Other | Admitting: Nurse Practitioner

## 2021-04-30 VITALS — BP 120/76 | HR 99 | Temp 98.7°F | Resp 20 | Ht 67.0 in | Wt 122.0 lb

## 2021-04-30 DIAGNOSIS — Z09 Encounter for follow-up examination after completed treatment for conditions other than malignant neoplasm: Secondary | ICD-10-CM | POA: Diagnosis not present

## 2021-04-30 DIAGNOSIS — J189 Pneumonia, unspecified organism: Secondary | ICD-10-CM | POA: Diagnosis not present

## 2021-04-30 NOTE — Patient Instructions (Signed)
Home Oxygen Use, Adult When a medical condition keeps you from getting enough oxygen, your health care provider may instruct you to take extra oxygen at home. Your health care provider will let you know: When to take oxygen. How long to take oxygen. How quickly oxygen should be delivered (flow rate), in liters per minute (LPM or L/M). Home oxygen can be given through: A mask. A nasal cannula. This is a device or tube that goes in the nostrils. A transtracheal catheter. This is a small, thin tube placed in the windpipe (trachea). A breathing tube (tracheostomy tube) that is surgically placed in the windpipe. This may be used in severe cases. These devices are connected with tubing to an oxygen source, such as: A tank. Tanks hold oxygen in gas form. They must be replaced when the oxygen is used up. A liquid oxygen device. This holds oxygen in liquid form. Liquid oxygen is very cold. It must be replaced when the oxygen is used up. An oxygen concentrator machine. This filters oxygen in the room. There are two types of oxygen concentrator machines--stationary and portable. A stationary oxygen concentrator machine plugs into the main electricity supply at your home. You must have a backup cylinder of oxygen in case the power goes out. A portable oxygen concentrator machine is smaller in size and more lightweight. This machine uses battery supply and can be used outside the home. Work with your health care provider to find equipment that works best for youand your lifestyle. What are the risks? Delivery of supplemental oxygen is generally safe. However, some risks include: Fire. This can happen if the oxygen is exposed to a heat source, flame, or spark. Injury to skin. This can happen if liquid oxygen touches your skin. Damage to the lungs or other organs. This can happen from getting too little or too much oxygen. Supplies needed: To use oxygen, you will need: A mask, nasal cannula, transtracheal  catheter, or tracheostomy. An oxygen tank, a liquid oxygen device, or an oxygen concentrator. The tape that your health care provider recommends (optional). Your health care provider may also recommend: A humidifier to warm and moisten the oxygen delivered. This will depend on how much oxygen you need and the type of home oxygen device you use. A pulse oximeter. This device measures the percentage of oxygen in your blood. How to use oxygen Your health care provider or a person from your medical device company will show you how to use your oxygen device. Follow his or her instructions. The instructions may look something like this: Wash your hands with soap and water. If you use an oxygen concentrator, make sure it is plugged in. Place one end of the tube into the port on the tank, device, or machine. Place the mask over your nose and mouth. Or, place the nasal cannula and secure it with tape if instructed. If you use a tracheostomy or transtracheal catheter, connect it to the oxygen source as directed. Make sure the liter-flow setting on the machine is at the level prescribed by your health care provider. Turn on the machine or adjust the knob on the tank or device to the correct liter-flow setting. When you are done, turn off and unplug the machine, or turn the knob to OFF. How to clean and care for the oxygen supplies Nasal cannula Clean it with a warm, wet cloth daily or as needed. Wash it with a liquid soap once a week. Rinse it thoroughly once or twice a week.   Air-dry it. Replace it every 2-4 weeks. If you have an infection, such as a cold or pneumonia, change the cannula when you get better. Mask Replace it every 2-4 weeks. If you have an infection, such as a cold or pneumonia, change the mask when you get better. Humidifier bottle Wash the bottle between each refill: Wash it with soap and warm water. Rinse it thoroughly. Clean it and its top with a disinfectant cleaner. Air-dry  it. Make sure it is dry before you refill it. Oxygen concentrator Clean the air filter at least twice a week according to directions from your home medical equipment and service company. Wipe down the cabinet every day. To do this: Unplug the unit. Wipe down the cabinet with a damp cloth. Dry the cabinet. Other equipment Change any extra tubing every 1-3 months. Follow instructions from your health care provider about taking care of any other equipment. Safety tips Fire safety tips  Keep your oxygen and oxygen supplies at least 6 ft (2 m) away from sources of heat, flames, and sparks at all times. Do not allow smoking near your oxygen. Put up "no smoking" signs in your home. Avoid smoking areas when in public. Do not use materials that can burn (are flammable) while you use oxygen. This includes: Petroleum jelly. Hair spray or other aerosol sprays. Rubbing alcohol. Hand sanitizer. When you go to a restaurant with portable oxygen, ask to be seated in the non-smoking section. Keep a fire extinguisher close by. Let your fire department know that you have oxygen in your home. Test your home smoke detectors regularly.  Traveling Secure your oxygen tank in the vehicle so that it does not move around. Follow instructions from your medical device company about how to safely secure your tank. Make sure you have enough oxygen for the amount of time you will be away from home. If you are planning to travel by public transportation (airplane, train, bus, or boat), contact the company to find out if it allows the use of an approved portable oxygen concentrator. You may also need documents from your health care provider and medical device company before you travel. General safety tips If you use an oxygen cylinder, make sure it is in a stand or secured to an object that will not move (fixed object). If you use liquid oxygen, make sure its container is kept upright at all times. If you use an oxygen  concentrator: Tell your electric company. Make sure you are given priority service in the event that your power goes out. Avoid using extension cords if possible. Follow these instructions at home: Use oxygen only as told by your health care provider. Do not use alcohol or other drugs that make you relax (sedating drugs) unless instructed. They can slow down your breathing rate and make it hard to get in enough oxygen. Know how and when to order a refill of oxygen. Always keep a spare tank of oxygen. Plan ahead for holidays when you may not be able to get a prescription filled. Use water-based lubricants on your lips or nostrils. Do not use oil-based products like petroleum jelly. To prevent skin irritation on your cheeks or behind your ears, tuck some gauze under the tubing. Where to find more information [employer] Association: www.lung.org/oxygen Contact a health care provider if: You get headaches often. You have a lasting cough. You are restless or have anxiety. You develop an illness that affects your breathing. You cannot exercise at your regular level. You   have a fever. You have persistent redness under your nose. Get help right away if: You are confused. You are sleepy all the time. You have blue lips or fingernails. You have difficult or irregular breathing that is getting worse. You are struggling to breathe. These symptoms may represent a serious problem that is an emergency. Do not wait to see if the symptoms will go away. Get medical help right away. Call your local emergency services (911 in the [place] Do not drive yourself to the hospital. Summary Your health care provider or a person from your medical device company will show you how to use your oxygen device. Follow his or her instructions. If you use an oxygen concentrator, make sure it is plugged in. Make sure the liter-flow setting on the machine is at the level prescribed by your health care provider. Use  oxygen only as told by your health care provider. Keep your oxygen and oxygen supplies at least 6 ft (2 m) away from sources of heat, flames, and sparks at all times. This information is not intended to replace advice given to you by your health care provider. Make sure you discuss any questions you have with your healthcare provider. Document Revised: 11/25/2019 Document Reviewed: 09/21/2019 Elsevier Patient Education  2022 [employer]  

## 2021-04-30 NOTE — Progress Notes (Signed)
   Subjective:    Patient ID: Laura Jacobs, female    DOB: 01-13-1934, 87 y.o.   MRN: [id number]  Today's visit was for Transitional Care Management.  The patient was discharged from [facility] on 04/20/21 with a primary diagnosis of pneumonia.   Contact with the patient and/or caregiver, by a clinical staff member, was made on 04/23/21 and was documented as a telephone encounter within the EMR.  Through chart review and discussion with the patient I have determined that management of their condition is of moderate complexity.   Patient is brought in by her son today for follow up. She was discharged home on antibiotic. She is on oxygen at home at 2L. Does not use it all the time. She is feeling better but is weak.       Review of Systems  Constitutional:  Negative for diaphoresis.  Eyes:  Negative for pain.  Respiratory:  Positive for cough and shortness of breath.   Cardiovascular:  Negative for chest pain, palpitations and leg swelling.  Gastrointestinal:  Negative for abdominal pain.  Endocrine: Negative for polydipsia.  Skin:  Negative for rash.  Neurological:  Negative for dizziness, weakness and headaches.  Hematological:  Does not bruise/bleed easily.  All other systems reviewed and are negative.     Objective:   Physical Exam Vitals and nursing note reviewed.  Constitutional:      Appearance: Normal appearance.  Cardiovascular:     Rate and Rhythm: Normal rate and regular rhythm.     Heart sounds: Normal heart sounds.  Pulmonary:     Effort: Pulmonary effort is normal.     Breath sounds: Rales (left side throughout) present.  Skin:    General: Skin is warm.  Neurological:     General: No focal deficit present.     Mental Status: She is alert and oriented to person, place, and time.  Psychiatric:        Mood and Affect: Mood normal.        Behavior: Behavior normal.   BP 120/76   Pulse 99   Temp 98.7 F (37.1 C) (Temporal)   Resp 20   Ht 5\' 7"   (1.702 m)   Wt 122 lb (55.3 kg)   SpO2 97%   BMI 19.11 kg/m         Assessment & Plan:   Laura Jacobs in today with chief complaint of Transitions Of Care   1. Pneumonia of left lower lobe due to infectious organism Resolving Will repeat chest xray in 5 weeks Need to get pulse ox to see what O2 sat is at home. Should at least do oxygen at night while sleeping  2. Hospital discharge follow-up Hospital records reviewed    The above assessment and management plan was discussed with the patient. The patient verbalized understanding of and has agreed to the management plan. Patient is aware to call the clinic if symptoms persist or worsen. Patient is aware when to return to the clinic for a follow-up visit. Patient educated on when it is appropriate to go to the emergency department.   [name], FNP

## 2021-05-07 ENCOUNTER — Ambulatory Visit (INDEPENDENT_AMBULATORY_CARE_PROVIDER_SITE_OTHER): Payer: Medicare Other

## 2021-05-07 VITALS — Ht 67.0 in | Wt 122.0 lb

## 2021-05-07 DIAGNOSIS — Z0000 Encounter for general adult medical examination without abnormal findings: Secondary | ICD-10-CM | POA: Diagnosis not present

## 2021-05-07 NOTE — Progress Notes (Signed)
Subjective:   Laura Jacobs is a 87 y.o. female who presents for Medicare Annual (Subsequent) preventive examination.  Virtual Visit via Telephone Note  I connected with  Laura Jacobs on 05/07/21 at  2:00 PM EDT by telephone and verified that I am speaking with the correct person using two identifiers.  Location: Patient: Home Provider: [facility] Persons participating in the virtual visit: patient and son [place] Advisor   I discussed the limitations, risks, security and privacy concerns of performing an evaluation and management service by telephone and the availability of in person appointments. The patient expressed understanding and agreed to proceed.  Interactive audio and video telecommunications were attempted between this nurse and patient, however failed, due to patient having technical difficulties OR patient did not have access to video capability.  We continued and completed visit with audio only.  Some vital signs may be absent or patient reported.   Kamile Fassler E Donatello Kleve, LPN   Review of Systems     Cardiac Risk Factors include: advanced age (>47men, >59 women);sedentary lifestyle;Other (see comment), Risk factor comments: CHF, COPD, chronic respiratory failure, A,fib     Objective:    Today's Vitals   05/07/21 1348  Weight: 122 lb (55.3 kg)  Height: 5\' 7"  (1.702 m)  PainSc: 0-No pain   Body mass index is 19.11 kg/m.  Advanced Directives 05/07/2021 04/19/2021 04/19/2021 05/04/2020 03/25/2020 02/09/2020 02/04/2020  Does Patient Have a Medical Advance Directive? Yes No No Yes No No No  Type of Paramedic of [place];Living will - - [place]  Does patient want to make changes to medical advance directive? - - - No - Patient declined - - -  Copy of [place] in Chart? No - copy requested - - No - copy requested - - -  Would patient like information on creating a medical advance  directive? - Yes (Inpatient - patient requests chaplain consult to create a medical advance directive) - - No - Patient declined No - Patient declined No - Patient declined    Current Medications (verified) Outpatient Encounter Medications as of 05/07/2021  Medication Sig   albuterol (PROVENTIL) (2.5 MG/3ML) 0.083% nebulizer solution Take 3 mLs (2.5 mg total) by nebulization every 2 (two) hours as needed for wheezing or shortness of breath.   albuterol (VENTOLIN HFA) 108 (90 Base) MCG/ACT inhaler Inhale 2 puffs into the lungs every 6 (six) hours as needed.   apixaban (ELIQUIS) 2.5 MG TABS tablet Take 1 tablet (2.5 mg total) by mouth 2 (two) times daily.   atorvastatin (LIPITOR) 40 MG tablet TAKE 1 TABLET ONCE DAILY IN THE EVENING   calcium-vitamin D (OSCAL 500/200 D-3) 500-200 MG-UNIT per tablet Take 1 tablet by mouth 2 (two) times daily.   citalopram (CELEXA) 20 MG tablet Take 1 tablet (20 mg total) by mouth daily.   ezetimibe (ZETIA) 10 MG tablet Take 1 tablet (10 mg total) by mouth daily.   Fluticasone-Umeclidin-Vilant (TRELEGY ELLIPTA) 100-62.5-25 MCG/INH AEPB Inhale 1 puff into the lungs daily.   furosemide (LASIX) 40 MG tablet Take 1 tablet (40 mg total) by mouth 2 (two) times daily.   ipratropium-albuterol (DUONEB) 0.5-2.5 (3) MG/3ML SOLN Take 3 mLs by nebulization 2 (two) times daily.   metoprolol tartrate (LOPRESSOR) 25 MG tablet TAKE  (1)  TABLET TWICE A DAY.   mirtazapine (REMERON) 7.5 MG tablet Take 1 tablet (7.5 mg total) by mouth at bedtime.   Nebulizers (COMPRESSOR/NEBULIZER)  MISC 1 Units by Does not apply route 3 times/day as needed-between meals & bedtime. -Nebulizer machine with tubing supplies to be used for administering nebulized medications as prescribed for COPD   nitroGLYCERIN (NITROSTAT) 0.4 MG SL tablet PLACE 1 TABLET UNDER THE TONGUE EVERY 5 MINUTES AS NEEDED FOR CHEST PA IN   potassium chloride SA (KLOR-CON) 20 MEQ tablet TAKE  (1)  TABLET TWICE A DAY.   No  facility-administered encounter medications on file as of 05/07/2021.    Allergies (verified) Patient has no known allergies.   History: Past Medical History:  Diagnosis Date   Aortic stenosis    Moderate by echo >>low flow low gradient preserved EF (SVI < 35 and DI 0.34) by echo 02/2021   CAD (coronary artery disease)    s/p inferior MI 1998 with subsequent cath showing 50% stenosis ostial Left main, 60% stenosis proximal LAD, 90% stenosis mid LAD, occluded mid CFX, 70% stenosis proximal RCA, 80% stenosis ostial PDA, occluded OM 2 and underwent CABG w LIMA to D1-D2-LAD, SVG to OM-circ, SVG to PD-PL 05/23/97.   Chronic atrial fibrillation (HCC)    Chronic diastolic CHF (congestive heart failure) (HCC)    CKD (chronic kidney disease), stage III (HCC)    COPD (chronic obstructive pulmonary disease) (HCC)    Gout    Gout    Hyperlipidemia    Hypertension    Pneumonia    Renal cysts and diabetes syndrome (HCC)    Valvular heart disease    Past Surgical History:  Procedure Laterality Date   heart by pass     Family History  Problem Relation Age of Onset   Heart disease Mother    Alcohol abuse Father    Alzheimer's disease Sister    Cancer Sister    Social History   Socioeconomic History   Marital status: Widowed    Spouse name: Homer   Number of children: 6   Years of education: 8   Highest education level: 8th grade  Occupational History   Occupation: retired  Tobacco Use   Smoking status: Former    Packs/day: 0.50    Years: 60.00    Pack years: 30.00    Types: Cigarettes    Quit date: 08/07/2014    Years since quitting: 6.7   Smokeless tobacco: Former    Quit date: 05/11/2014  Vaping Use   Vaping Use: Never used  Substance and Sexual Activity   Alcohol use: No   Drug use: No   Sexual activity: Yes    Birth control/protection: Post-menopausal  Other Topics Concern   Not on file  Social History Narrative   Retired, widowed, lives with two sons and a  granddaughter. Having a harder time getting around, getting less active and memory is worsening - 05/07/21   Social Determinants of Health   Financial Resource Strain: Low Risk    Difficulty of Paying Living Expenses: Not hard at all  Food Insecurity: No Food Insecurity   Worried About Charity fundraiser in the Last Year: Never true   Ran Out of Food in the Last Year: Never true  Transportation Needs: No Transportation Needs   Lack of Transportation (Medical): No   Lack of Transportation (Non-Medical): No  Physical Activity: Insufficiently Active   Days of Exercise per Week: 7 days   Minutes of Exercise per Session: 10 min  Stress: No Stress Concern Present   Feeling of Stress : Not at all  Social Connections: Socially Isolated  Frequency of Communication with Friends and Family: Once a week   Frequency of Social Gatherings with Friends and Family: More than three times a week   Attends Religious Services: Never   Marine scientist or Organizations: No   Attends Archivist Meetings: Never   Marital Status: Widowed    Tobacco Counseling Counseling given: Not Answered   Clinical Intake:  Pre-visit preparation completed: Yes  Pain : No/denies pain Pain Score: 0-No pain     BMI - recorded: 19.11 Nutritional Status: BMI of 19-24  Normal Nutritional Risks: None Diabetes: No  How often do you need to have someone help you when you read instructions, pamphlets, or other written materials from your doctor or pharmacy?: 1 - Never  Diabetic? No  Interpreter Needed?: No  Information entered by :: Micki Cassel, LPN   Activities of Daily Living In your present state of health, do you have any difficulty performing the following activities: 05/07/2021 04/19/2021  Hearing? Tempie Donning  Vision? N N  Difficulty concentrating or making decisions? Y N  Walking or climbing stairs? Y N  Dressing or bathing? Y N  Doing errands, shopping? Tempie Donning  Preparing Food and eating ? N -   Using the Toilet? N -  In the past six months, have you accidently leaked urine? Y -  Do you have problems with loss of bowel control? N -  Managing your Medications? Y -  Managing your Finances? Y -  Housekeeping or managing your Housekeeping? Y -  Some recent data might be hidden    Patient Care Team: [name], FNP as PCP - General (Nurse Practitioner) Sueanne Margarita, MD as PCP - Cardiology (Cardiology) Jacolyn Reedy, MD as Consulting Physician (Cardiology) Fran Lowes, MD (Inactive) as Consulting Physician (Nephrology) Okey Regal, OD as Consulting Physician (Optometry)  Indicate any recent Medical Services you may have received from other than [facility] providers in the past year (date may be approximate).     Assessment:   This is a routine wellness examination for Sanja.  Hearing/Vision screen Hearing Screening - Comments:: C/o moderate to severe hearing difficulties - seems to be worsening with time - declines hearing aids at this time. Vision Screening - Comments:: Denies vision difficulties - behind on annual eye exams - unknown previous eye doctor  Dietary issues and exercise activities discussed: Current Exercise Habits: The patient does not participate in regular exercise at present, Exercise limited by: psychological condition(s);neurologic condition(s)   Goals Addressed             This Visit's Progress    Have 3 meals a day   No change    Patient Stated   Not on track    05/04/2020 AWV Goal: Exercise for General Health  Patient will verbalize understanding of the benefits of increased physical activity: Exercising regularly is important. It will improve your overall fitness, flexibility, and endurance. Regular exercise also will improve your overall health. It can help you control your weight, reduce stress, and improve your bone density. Over the next year, patient will increase physical activity as tolerated with a goal of at least  150 minutes of moderate physical activity per week.  You can tell that you are exercising at a moderate intensity if your heart starts beating faster and you start breathing faster but can still hold a conversation. Moderate-intensity exercise ideas include: Walking 1 mile (1.6 km) in about 15 minutes Biking Hiking Golfing Dancing Water aerobics Patient will verbalize  understanding of everyday activities that increase physical activity by providing examples like the following: Yard work, such as: Sales promotion account executive Gardening Washing windows or floors Patient will be able to explain general safety guidelines for exercising:  Before you start a new exercise program, talk with your health care provider. Do not exercise so much that you hurt yourself, feel dizzy, or get very short of breath. Wear comfortable clothes and wear shoes with good support. Drink plenty of water while you exercise to prevent dehydration or heat stroke. Work out until your breathing and your heartbeat get faster.        Depression Screen PHQ 2/9 Scores 05/07/2021 03/08/2021 11/29/2020 08/29/2020 05/12/2020 05/04/2020 03/13/2020  PHQ - 2 Score 0 0 0 0 0 0 0    Fall Risk Fall Risk  05/07/2021 03/08/2021 11/29/2020 08/29/2020 05/12/2020  Falls in the past year? 0 0 0 0 0  Number falls in past yr: 0 - - - 0  Injury with Fall? 0 - - - 0  Comment - - - - -  Risk for fall due to : Impaired balance/gait;Mental status change - - - No Fall Risks  Follow up Education provided;Falls prevention discussed - - - Falls evaluation completed    FALL RISK PREVENTION PERTAINING TO THE HOME:  Any stairs in or around the home? No  If so, are there any without handrails? No  Home free of loose throw rugs in walkways, pet beds, electrical cords, etc? Yes  Adequate lighting in your home to reduce risk of falls? Yes   ASSISTIVE DEVICES UTILIZED TO PREVENT  FALLS:  Life alert? No  Use of a cane, walker or w/c? No  Grab bars in the bathroom? Yes  Shower chair or bench in shower? No  Elevated toilet seat or a handicapped toilet? Yes   TIMED UP AND GO:  Was the test performed? No . Telephonic visit  Cognitive Function: Cognitive status assessed by direct observation. Patient could not answer any of the questions/ unable to complete screening 6CIT or MMSE.    MMSE - Mini Mental State Exam 02/27/2015  Orientation to time 4  Orientation to Place 5  Registration 3  Attention/ Calculation 5  Recall 3  Language- name 2 objects 1  Language- repeat 1  Language- follow 3 step command 3  Language- read & follow direction 1  Write a sentence 1  Copy design 1  Total score 28     6CIT Screen 05/04/2020 04/01/2019  What Year? 0 points 0 points  What month? 0 points 0 points  What time? 0 points 0 points  Count back from 20 0 points 0 points  Months in reverse 0 points 4 points  Repeat phrase 10 points 2 points  Total Score 10 6    Immunizations Immunization History  Administered Date(s) Administered   Fluad Quad(high Dose 65+) 08/29/2020   Influenza, High Dose Seasonal PF 07/07/2014, 07/25/2016, 09/08/2017   Pneumococcal Conjugate-13 02/23/2015   Pneumococcal Polysaccharide-23 10/28/2016   Td 05/08/1997, 12/09/2003    TDAP status: Due, Education has been provided regarding the importance of this vaccine. Advised may receive this vaccine at local pharmacy or Health Dept. Aware to provide a copy of the vaccination record if obtained from local pharmacy or Health Dept. Verbalized acceptance and understanding.  Flu Vaccine status: Up to date  Pneumococcal vaccine status: Up to date  Covid-19 vaccine status: Information provided  on how to obtain vaccines.   Qualifies for Shingles Vaccine? Yes   Zostavax completed No   Shingrix Completed?: No.    Education has been provided regarding the importance of this vaccine. Patient has been  advised to call insurance company to determine out of pocket expense if they have not yet received this vaccine. Advised may also receive vaccine at local pharmacy or Health Dept. Verbalized acceptance and understanding.  Screening Tests Health Maintenance  Topic Date Due   COVID-19 Vaccine (1) Never done   INFLUENZA VACCINE  05/07/2021   Zoster Vaccines- Shingrix (1 of 2) 06/08/2021 (Originally 03/31/1984)   MAMMOGRAM  11/29/2021 (Originally 03/31/1952)   TETANUS/TDAP  11/29/2021 (Originally 12/08/2013)   DEXA SCAN  12/01/2022   PNA vac Low Risk Adult  Completed   HPV VACCINES  Aged Out    Health Maintenance  Health Maintenance Due  Topic Date Due   COVID-19 Vaccine (1) Never done   INFLUENZA VACCINE  05/07/2021    Colorectal cancer screening: No longer required.   Mammogram status: No longer required due to age.  Bone Density status: Completed 12/01/2020. Results reflect: Bone density results: OSTEOPOROSIS. Repeat every 2 years.  Lung Cancer Screening: (Low Dose CT Chest recommended if Age 75-80 years, 30 pack-year currently smoking OR have quit w/in 15years.) does not qualify.   Additional Screening:  Hepatitis C Screening: does not qualify  Vision Screening: Recommended annual ophthalmology exams for early detection of glaucoma and other disorders of the eye. Is the patient up to date with their annual eye exam?  No  Who is the provider or what is the name of the office in which the patient attends annual eye exams? none If pt is not established with a provider, would they like to be referred to a provider to establish care? No .   Dental Screening: Recommended annual dental exams for proper oral hygiene  Community Resource Referral / Chronic Care Management: CRR required this visit?  No   CCM required this visit?  No      Plan:     I have personally reviewed and noted the following in the patient's chart:   Medical and social history Use of alcohol, tobacco or  illicit drugs  Current medications and supplements including opioid prescriptions.  Functional ability and status Nutritional status Physical activity Advanced directives List of other physicians Hospitalizations, surgeries, and ER visits in previous 12 months Vitals Screenings to include cognitive, depression, and falls Referrals and appointments  In addition, I have reviewed and discussed with patient certain preventive protocols, quality metrics, and best practice recommendations. A written personalized care plan for preventive services as well as general preventive health recommendations were provided to patient.     Sandrea Hammond, LPN   [id number]/04/6807   Nurse Notes: None

## 2021-05-07 NOTE — Patient Instructions (Signed)
Laura Jacobs , Thank you for taking time to come for your Medicare Wellness Visit. I appreciate your ongoing commitment to your health goals. Please review the following plan we discussed and let me know if I can assist you in the future.   Screening recommendations/referrals: Colonoscopy: No longer required Mammogram: No longer required Bone Density: Done 12/01/2020 - Repeat every 2 years Recommended yearly ophthalmology/optometry visit for glaucoma screening and checkup Recommended yearly dental visit for hygiene and checkup  Vaccinations: Influenza vaccine: Done 08/29/2020 - Repeat annually Pneumococcal vaccine: Done 02/23/2015 & 10/28/2016 Tdap vaccine: Done 2005 - Repeat in 10 years - *Due Shingles vaccine: *Due. Shingrix discussed. Please contact your pharmacy for coverage information.     Covid-19: Unknown if she had these (we need records if so)  Advanced directives: Please bring a copy of your health care power of attorney and living will to the office to be added to your chart at your convenience.   Conditions/risks identified: Try to aim for 3 full meals per day or more frequent small healthy snacks, supplement with Ensure if unable to eat, push fluids - mostly water - Aim for 6-8 glasses per day. Try to stay active, moving around every hour at least.  Next appointment: Follow up in one year for your annual wellness visit    Preventive Care 65 Years and Older, Female Preventive care refers to lifestyle choices and visits with your health care provider that can promote health and wellness. What does preventive care include? A yearly physical exam. This is also called an annual well check. Dental exams once or twice a year. Routine eye exams. Ask your health care provider how often you should have your eyes checked. Personal lifestyle choices, including: Daily care of your teeth and gums. Regular physical activity. Eating a healthy diet. Avoiding tobacco and drug  use. Limiting alcohol use. Practicing safe sex. Taking low-dose aspirin every day. Taking vitamin and mineral supplements as recommended by your health care provider. What happens during an annual well check? The services and screenings done by your health care provider during your annual well check will depend on your age, overall health, lifestyle risk factors, and family history of disease. Counseling  Your health care provider may ask you questions about your: Alcohol use. Tobacco use. Drug use. Emotional well-being. Home and relationship well-being. Sexual activity. Eating habits. History of falls. Memory and ability to understand (cognition). Work and work Statistician. Reproductive health. Screening  You may have the following tests or measurements: Height, weight, and BMI. Blood pressure. Lipid and cholesterol levels. These may be checked every 5 years, or more frequently if you are over 62 years old. Skin check. Lung cancer screening. You may have this screening every year starting at age 30 if you have a 30-pack-year history of smoking and currently smoke or have quit within the past 15 years. Fecal occult blood test (FOBT) of the stool. You may have this test every year starting at age 64. Flexible sigmoidoscopy or colonoscopy. You may have a sigmoidoscopy every 5 years or a colonoscopy every 10 years starting at age 40. Hepatitis C blood test. Hepatitis B blood test. Sexually transmitted disease (STD) testing. Diabetes screening. This is done by checking your blood sugar (glucose) after you have not eaten for a while (fasting). You may have this done every 1-3 years. Bone density scan. This is done to screen for osteoporosis. You may have this done starting at age 16. Mammogram. This may be done every 1-2  years. Talk to your health care provider about how often you should have regular mammograms. Talk with your health care provider about your test results, treatment  options, and if necessary, the need for more tests. Vaccines  Your health care provider may recommend certain vaccines, such as: Influenza vaccine. This is recommended every year. Tetanus, diphtheria, and acellular pertussis (Tdap, Td) vaccine. You may need a Td booster every 10 years. Zoster vaccine. You may need this after age 70. Pneumococcal 13-valent conjugate (PCV13) vaccine. One dose is recommended after age 46. Pneumococcal polysaccharide (PPSV23) vaccine. One dose is recommended after age 34. Talk to your health care provider about which screenings and vaccines you need and how often you need them. This information is not intended to replace advice given to you by your health care provider. Make sure you discuss any questions you have with your health care provider. Document Released: 10/20/2015 Document Revised: 06/12/2016 Document Reviewed: 07/25/2015 Elsevier Interactive Patient Education  2017 [employer] Prevention in the Home Falls can cause injuries. They can happen to people of all ages. There are many things you can do to make your home safe and to help prevent falls. What can I do on the outside of my home? Regularly fix the edges of walkways and driveways and fix any cracks. Remove anything that might make you trip as you walk through a door, such as a raised step or threshold. Trim any bushes or trees on the path to your home. Use bright outdoor lighting. Clear any walking paths of anything that might make someone trip, such as rocks or tools. Regularly check to see if handrails are loose or broken. Make sure that both sides of any steps have handrails. Any raised decks and porches should have guardrails on the edges. Have any leaves, snow, or ice cleared regularly. Use sand or salt on walking paths during winter. Clean up any spills in your garage right away. This includes oil or grease spills. What can I do in the bathroom? Use night lights. Install grab  bars by the toilet and in the tub and shower. Do not use towel bars as grab bars. Use non-skid mats or decals in the tub or shower. If you need to sit down in the shower, use a plastic, non-slip stool. Keep the floor dry. Clean up any water that spills on the floor as soon as it happens. Remove soap buildup in the tub or shower regularly. Attach bath mats securely with double-sided non-slip rug tape. Do not have throw rugs and other things on the floor that can make you trip. What can I do in the bedroom? Use night lights. Make sure that you have a light by your bed that is easy to reach. Do not use any sheets or blankets that are too big for your bed. They should not hang down onto the floor. Have a firm chair that has side arms. You can use this for support while you get dressed. Do not have throw rugs and other things on the floor that can make you trip. What can I do in the kitchen? Clean up any spills right away. Avoid walking on wet floors. Keep items that you use a lot in easy-to-reach places. If you need to reach something above you, use a strong step stool that has a grab bar. Keep electrical cords out of the way. Do not use floor polish or wax that makes floors slippery. If you must use wax, use non-skid floor  wax. Do not have throw rugs and other things on the floor that can make you trip. What can I do with my stairs? Do not leave any items on the stairs. Make sure that there are handrails on both sides of the stairs and use them. Fix handrails that are broken or loose. Make sure that handrails are as long as the stairways. Check any carpeting to make sure that it is firmly attached to the stairs. Fix any carpet that is loose or worn. Avoid having throw rugs at the top or bottom of the stairs. If you do have throw rugs, attach them to the floor with carpet tape. Make sure that you have a light switch at the top of the stairs and the bottom of the stairs. If you do not have them,  ask someone to add them for you. What else can I do to help prevent falls? Wear shoes that: Do not have high heels. Have rubber bottoms. Are comfortable and fit you well. Are closed at the toe. Do not wear sandals. If you use a stepladder: Make sure that it is fully opened. Do not climb a closed stepladder. Make sure that both sides of the stepladder are locked into place. Ask someone to hold it for you, if possible. Clearly mark and make sure that you can see: Any grab bars or handrails. First and last steps. Where the edge of each step is. Use tools that help you move around (mobility aids) if they are needed. These include: Canes. Walkers. Scooters. Crutches. Turn on the lights when you go into a dark area. Replace any light bulbs as soon as they burn out. Set up your furniture so you have a clear path. Avoid moving your furniture around. If any of your floors are uneven, fix them. If there are any pets around you, be aware of where they are. Review your medicines with your doctor. Some medicines can make you feel dizzy. This can increase your chance of falling. Ask your doctor what other things that you can do to help prevent falls. This information is not intended to replace advice given to you by your health care provider. Make sure you discuss any questions you have with your health care provider. Document Released: 07/20/2009 Document Revised: 02/29/2016 Document Reviewed: 10/28/2014 Elsevier Interactive Patient Education  2017 [employer].

## 2021-06-10 ENCOUNTER — Encounter (HOSPITAL_COMMUNITY): Payer: Self-pay | Admitting: Internal Medicine

## 2021-06-10 ENCOUNTER — Inpatient Hospital Stay (HOSPITAL_COMMUNITY)
Admission: EM | Admit: 2021-06-10 | Discharge: 2021-06-16 | DRG: 178 | Disposition: A | Discharge status: Hospice | Payer: Medicare Other | Attending: Internal Medicine | Admitting: Internal Medicine

## 2021-06-10 ENCOUNTER — Other Ambulatory Visit: Payer: Self-pay

## 2021-06-10 ENCOUNTER — Emergency Department (HOSPITAL_COMMUNITY): Payer: Medicare Other

## 2021-06-10 DIAGNOSIS — I2510 Atherosclerotic heart disease of native coronary artery without angina pectoris: Secondary | ICD-10-CM | POA: Diagnosis present

## 2021-06-10 DIAGNOSIS — R630 Anorexia: Secondary | ICD-10-CM | POA: Diagnosis not present

## 2021-06-10 DIAGNOSIS — I5042 Chronic combined systolic (congestive) and diastolic (congestive) heart failure: Secondary | ICD-10-CM | POA: Diagnosis present

## 2021-06-10 DIAGNOSIS — I350 Nonrheumatic aortic (valve) stenosis: Secondary | ICD-10-CM | POA: Diagnosis not present

## 2021-06-10 DIAGNOSIS — Z951 Presence of aortocoronary bypass graft: Secondary | ICD-10-CM | POA: Diagnosis not present

## 2021-06-10 DIAGNOSIS — I252 Old myocardial infarction: Secondary | ICD-10-CM | POA: Diagnosis not present

## 2021-06-10 DIAGNOSIS — E11649 Type 2 diabetes mellitus with hypoglycemia without coma: Secondary | ICD-10-CM | POA: Diagnosis present

## 2021-06-10 DIAGNOSIS — N179 Acute kidney failure, unspecified: Secondary | ICD-10-CM

## 2021-06-10 DIAGNOSIS — I130 Hypertensive heart and chronic kidney disease with heart failure and stage 1 through stage 4 chronic kidney disease, or unspecified chronic kidney disease: Secondary | ICD-10-CM | POA: Diagnosis present

## 2021-06-10 DIAGNOSIS — M109 Gout, unspecified: Secondary | ICD-10-CM | POA: Diagnosis present

## 2021-06-10 DIAGNOSIS — I517 Cardiomegaly: Secondary | ICD-10-CM | POA: Diagnosis not present

## 2021-06-10 DIAGNOSIS — N184 Chronic kidney disease, stage 4 (severe): Secondary | ICD-10-CM | POA: Diagnosis not present

## 2021-06-10 DIAGNOSIS — E785 Hyperlipidemia, unspecified: Secondary | ICD-10-CM | POA: Diagnosis present

## 2021-06-10 DIAGNOSIS — I482 Chronic atrial fibrillation, unspecified: Secondary | ICD-10-CM | POA: Diagnosis not present

## 2021-06-10 DIAGNOSIS — E876 Hypokalemia: Secondary | ICD-10-CM | POA: Diagnosis present

## 2021-06-10 DIAGNOSIS — Z87891 Personal history of nicotine dependence: Secondary | ICD-10-CM | POA: Diagnosis not present

## 2021-06-10 DIAGNOSIS — Z6821 Body mass index (BMI) 21.0-21.9, adult: Secondary | ICD-10-CM

## 2021-06-10 DIAGNOSIS — Z79899 Other long term (current) drug therapy: Secondary | ICD-10-CM

## 2021-06-10 DIAGNOSIS — R531 Weakness: Secondary | ICD-10-CM | POA: Diagnosis not present

## 2021-06-10 DIAGNOSIS — R059 Cough, unspecified: Secondary | ICD-10-CM | POA: Diagnosis not present

## 2021-06-10 DIAGNOSIS — R0902 Hypoxemia: Secondary | ICD-10-CM | POA: Diagnosis not present

## 2021-06-10 DIAGNOSIS — U071 COVID-19: Secondary | ICD-10-CM | POA: Diagnosis not present

## 2021-06-10 DIAGNOSIS — E1122 Type 2 diabetes mellitus with diabetic chronic kidney disease: Secondary | ICD-10-CM | POA: Diagnosis present

## 2021-06-10 DIAGNOSIS — Z7951 Long term (current) use of inhaled steroids: Secondary | ICD-10-CM

## 2021-06-10 DIAGNOSIS — J449 Chronic obstructive pulmonary disease, unspecified: Secondary | ICD-10-CM | POA: Diagnosis present

## 2021-06-10 DIAGNOSIS — Z7901 Long term (current) use of anticoagulants: Secondary | ICD-10-CM | POA: Diagnosis not present

## 2021-06-10 DIAGNOSIS — Z8249 Family history of ischemic heart disease and other diseases of the circulatory system: Secondary | ICD-10-CM

## 2021-06-10 DIAGNOSIS — I5022 Chronic systolic (congestive) heart failure: Secondary | ICD-10-CM | POA: Diagnosis not present

## 2021-06-10 DIAGNOSIS — F0390 Unspecified dementia without behavioral disturbance: Secondary | ICD-10-CM | POA: Diagnosis present

## 2021-06-10 DIAGNOSIS — E860 Dehydration: Secondary | ICD-10-CM | POA: Diagnosis not present

## 2021-06-10 DIAGNOSIS — R627 Adult failure to thrive: Secondary | ICD-10-CM | POA: Diagnosis not present

## 2021-06-10 DIAGNOSIS — Z743 Need for continuous supervision: Secondary | ICD-10-CM | POA: Diagnosis not present

## 2021-06-10 DIAGNOSIS — Z7189 Other specified counseling: Secondary | ICD-10-CM | POA: Diagnosis not present

## 2021-06-10 DIAGNOSIS — Z66 Do not resuscitate: Secondary | ICD-10-CM | POA: Diagnosis not present

## 2021-06-10 DIAGNOSIS — Z515 Encounter for palliative care: Secondary | ICD-10-CM | POA: Diagnosis not present

## 2021-06-10 DIAGNOSIS — Z8701 Personal history of pneumonia (recurrent): Secondary | ICD-10-CM

## 2021-06-10 DIAGNOSIS — I5032 Chronic diastolic (congestive) heart failure: Secondary | ICD-10-CM

## 2021-06-10 DIAGNOSIS — I10 Essential (primary) hypertension: Secondary | ICD-10-CM | POA: Diagnosis not present

## 2021-06-10 LAB — CBC WITH DIFFERENTIAL/PLATELET
Abs Immature Granulocytes: 0.05 10*3/uL (ref 0.00–0.07)
Basophils Absolute: 0 10*3/uL (ref 0.0–0.1)
Basophils Relative: 0 %
Eosinophils Absolute: 0 10*3/uL (ref 0.0–0.5)
Eosinophils Relative: 0 %
HCT: 32.9 % — ABNORMAL LOW (ref 36.0–46.0)
Hemoglobin: 10.7 g/dL — ABNORMAL LOW (ref 12.0–15.0)
Immature Granulocytes: 1 %
Lymphocytes Relative: 19 %
Lymphs Abs: 1.1 10*3/uL (ref 0.7–4.0)
MCH: 30.7 pg (ref 26.0–34.0)
MCHC: 32.5 g/dL (ref 30.0–36.0)
MCV: 94.3 fL (ref 80.0–100.0)
Monocytes Absolute: 0.5 10*3/uL (ref 0.1–1.0)
Monocytes Relative: 9 %
Neutro Abs: 4.1 10*3/uL (ref 1.7–7.7)
Neutrophils Relative %: 71 %
Platelets: 132 10*3/uL — ABNORMAL LOW (ref 150–400)
RBC: 3.49 MIL/uL — ABNORMAL LOW (ref 3.87–5.11)
RDW: 14.6 % (ref 11.5–15.5)
WBC: 5.7 10*3/uL (ref 4.0–10.5)
nRBC: 0 % (ref 0.0–0.2)

## 2021-06-10 LAB — RESP PANEL BY RT-PCR (FLU A&B, COVID) ARPGX2
Influenza A by PCR: NEGATIVE
Influenza B by PCR: NEGATIVE
SARS Coronavirus 2 by RT PCR: POSITIVE — AB

## 2021-06-10 LAB — COMPREHENSIVE METABOLIC PANEL
ALT: 14 U/L (ref 0–44)
AST: 48 U/L — ABNORMAL HIGH (ref 15–41)
Albumin: 2.6 g/dL — ABNORMAL LOW (ref 3.5–5.0)
Alkaline Phosphatase: 71 U/L (ref 38–126)
Anion gap: 10 (ref 5–15)
BUN: 37 mg/dL — ABNORMAL HIGH (ref 8–23)
CO2: 24 mmol/L (ref 22–32)
Calcium: 7.6 mg/dL — ABNORMAL LOW (ref 8.9–10.3)
Chloride: 101 mmol/L (ref 98–111)
Creatinine, Ser: 2.73 mg/dL — ABNORMAL HIGH (ref 0.44–1.00)
GFR, Estimated: 17 mL/min — ABNORMAL LOW (ref >60)
Glucose, Bld: 86 mg/dL (ref 70–99)
Potassium: 3.8 mmol/L (ref 3.5–5.1)
Sodium: 135 mmol/L (ref 135–145)
Total Bilirubin: 0.8 mg/dL (ref 0.3–1.2)
Total Protein: 5.9 g/dL — ABNORMAL LOW (ref 6.5–8.1)

## 2021-06-10 LAB — BRAIN NATRIURETIC PEPTIDE: B Natriuretic Peptide: 497 pg/mL — ABNORMAL HIGH (ref 0.0–100.0)

## 2021-06-10 LAB — FERRITIN: Ferritin: 884 ng/mL — ABNORMAL HIGH (ref 11–307)

## 2021-06-10 LAB — D-DIMER, QUANTITATIVE: D-Dimer, Quant: 0.75 ug/mL-FEU — ABNORMAL HIGH (ref 0.00–0.50)

## 2021-06-10 LAB — C-REACTIVE PROTEIN: CRP: 2.4 mg/dL — ABNORMAL HIGH (ref <1.0)

## 2021-06-10 LAB — PROCALCITONIN: Procalcitonin: 0.41 ng/mL

## 2021-06-10 MED ORDER — CITALOPRAM HYDROBROMIDE 20 MG PO TABS
20.0000 mg | ORAL_TABLET | Freq: Every day | ORAL | Status: DC
  Administered 2021-06-11 – 2021-06-16 (×6): 20 mg via ORAL
  Filled 2021-06-10 (×6): qty 1

## 2021-06-10 MED ORDER — ACETAMINOPHEN 650 MG RE SUPP: 650.0000 mg | Freq: Four times a day (QID) | RECTAL | Status: DC | PRN

## 2021-06-10 MED ORDER — UMECLIDINIUM BROMIDE 62.5 MCG/INH IN AEPB
1.0000 | INHALATION_SPRAY | Freq: Every day | RESPIRATORY_TRACT | Status: DC
  Administered 2021-06-11 – 2021-06-16 (×6): 1 via RESPIRATORY_TRACT
  Filled 2021-06-10 (×2): qty 7

## 2021-06-10 MED ORDER — SODIUM CHLORIDE 0.9 % IV SOLN
100.0000 mg | Freq: Every day | INTRAVENOUS | Status: AC
  Administered 2021-06-11 – 2021-06-14 (×4): 100 mg via INTRAVENOUS
  Filled 2021-06-10 (×4): qty 20

## 2021-06-10 MED ORDER — MIRTAZAPINE 15 MG PO TABS
7.5000 mg | ORAL_TABLET | Freq: Every day | ORAL | Status: DC
  Administered 2021-06-10 – 2021-06-15 (×6): 7.5 mg via ORAL
  Filled 2021-06-10 (×6): qty 1

## 2021-06-10 MED ORDER — LACTATED RINGERS IV SOLN: INTRAVENOUS | Status: DC

## 2021-06-10 MED ORDER — GUAIFENESIN-DM 100-10 MG/5ML PO SYRP: 10.0000 mL | ORAL_SOLUTION | ORAL | Status: DC | PRN

## 2021-06-10 MED ORDER — FLUTICASONE FUROATE-VILANTEROL 200-25 MCG/INH IN AEPB
1.0000 | INHALATION_SPRAY | Freq: Every day | RESPIRATORY_TRACT | Status: DC
  Administered 2021-06-11 – 2021-06-16 (×6): 1 via RESPIRATORY_TRACT
  Filled 2021-06-10 (×2): qty 28

## 2021-06-10 MED ORDER — ONDANSETRON HCL 4 MG/2ML IJ SOLN: 4.0000 mg | Freq: Four times a day (QID) | INTRAMUSCULAR | Status: DC | PRN

## 2021-06-10 MED ORDER — ASCORBIC ACID 500 MG PO TABS
500.0000 mg | ORAL_TABLET | Freq: Every day | ORAL | Status: DC
  Administered 2021-06-11 – 2021-06-16 (×6): 500 mg via ORAL
  Filled 2021-06-10 (×6): qty 1

## 2021-06-10 MED ORDER — ONDANSETRON HCL 4 MG PO TABS: 4.0000 mg | ORAL_TABLET | Freq: Four times a day (QID) | ORAL | Status: DC | PRN

## 2021-06-10 MED ORDER — SODIUM CHLORIDE 0.9 % IV SOLN
100.0000 mg | Freq: Once | INTRAVENOUS | Status: AC
  Administered 2021-06-10: 100 mg via INTRAVENOUS
  Filled 2021-06-10: qty 20

## 2021-06-10 MED ORDER — NITROGLYCERIN 0.4 MG SL SUBL: 0.4000 mg | SUBLINGUAL_TABLET | SUBLINGUAL | Status: DC | PRN

## 2021-06-10 MED ORDER — SODIUM CHLORIDE 0.9 % IV SOLN
100.0000 mg | Freq: Once | INTRAVENOUS | Status: AC
  Administered 2021-06-11: 100 mg via INTRAVENOUS
  Filled 2021-06-10: qty 20

## 2021-06-10 MED ORDER — METOPROLOL TARTRATE 25 MG PO TABS
25.0000 mg | ORAL_TABLET | Freq: Two times a day (BID) | ORAL | Status: DC
  Administered 2021-06-10 – 2021-06-15 (×10): 25 mg via ORAL
  Filled 2021-06-10 (×12): qty 1

## 2021-06-10 MED ORDER — ZINC SULFATE 220 (50 ZN) MG PO CAPS
220.0000 mg | ORAL_CAPSULE | Freq: Every day | ORAL | Status: DC
  Administered 2021-06-11 – 2021-06-16 (×6): 220 mg via ORAL
  Filled 2021-06-10 (×6): qty 1

## 2021-06-10 MED ORDER — ALBUTEROL SULFATE HFA 108 (90 BASE) MCG/ACT IN AERS: 2.0000 | INHALATION_SPRAY | Freq: Four times a day (QID) | RESPIRATORY_TRACT | Status: DC | PRN

## 2021-06-10 MED ORDER — ACETAMINOPHEN 325 MG PO TABS: 650.0000 mg | ORAL_TABLET | Freq: Four times a day (QID) | ORAL | Status: DC | PRN

## 2021-06-10 MED ORDER — APIXABAN 2.5 MG PO TABS
2.5000 mg | ORAL_TABLET | Freq: Two times a day (BID) | ORAL | Status: DC
  Administered 2021-06-10 – 2021-06-16 (×12): 2.5 mg via ORAL
  Filled 2021-06-10 (×12): qty 1

## 2021-06-10 MED ORDER — FLUTICASONE-UMECLIDIN-VILANT 100-62.5-25 MCG/INH IN AEPB: 1.0000 | INHALATION_SPRAY | Freq: Every day | RESPIRATORY_TRACT | Status: DC

## 2021-06-10 MED ORDER — LACTATED RINGERS IV BOLUS
1000.0000 mL | Freq: Once | INTRAVENOUS | Status: AC
  Administered 2021-06-10: 1000 mL via INTRAVENOUS

## 2021-06-10 MED ORDER — EZETIMIBE 10 MG PO TABS
10.0000 mg | ORAL_TABLET | Freq: Every day | ORAL | Status: DC
  Administered 2021-06-11 – 2021-06-16 (×6): 10 mg via ORAL
  Filled 2021-06-10 (×6): qty 1

## 2021-06-10 MED ORDER — ATORVASTATIN CALCIUM 40 MG PO TABS
40.0000 mg | ORAL_TABLET | Freq: Every day | ORAL | Status: DC
  Administered 2021-06-11 – 2021-06-16 (×6): 40 mg via ORAL
  Filled 2021-06-10 (×6): qty 1

## 2021-06-10 NOTE — Progress Notes (Signed)
Breo and incruse --- incentive and flutter placed in room

## 2021-06-10 NOTE — ED Provider Notes (Signed)
[facility] EMERGENCY DEPARTMENT Provider Note  CSN: [id number] Arrival date & time: 06/10/21 1713    History Chief Complaint  Patient presents with   Weakness    Laura Jacobs is a 87 y.o. female with history of pAF, CKD was admitted for Pneumonia in July, went home with Abx and home oxygen a few days later. Seen in PCP office about a month ago and was doing well then. She was brought to the ED via EMS for evaluation of poor oral intake. The patient cannot give any further history.    Past Medical History:  Diagnosis Date   Aortic stenosis    Moderate by echo >>low flow low gradient preserved EF (SVI < 35 and DI 0.34) by echo 02/2021   CAD (coronary artery disease)    s/p inferior MI 1998 with subsequent cath showing 50% stenosis ostial Left main, 60% stenosis proximal LAD, 90% stenosis mid LAD, occluded mid CFX, 70% stenosis proximal RCA, 80% stenosis ostial PDA, occluded OM 2 and underwent CABG w LIMA to D1-D2-LAD, SVG to OM-circ, SVG to PD-PL 05/23/97.   Chronic atrial fibrillation (HCC)    Chronic diastolic CHF (congestive heart failure) (HCC)    Chronic systolic CHF (congestive heart failure) ([facility]) 02/09/2020   CKD (chronic kidney disease), stage III (HCC)    COPD (chronic obstructive pulmonary disease) (HCC)    Gout    Gout    Hyperlipidemia    Hypertension    Pneumonia    Renal cysts and diabetes syndrome (HCC)    Valvular heart disease     Past Surgical History:  Procedure Laterality Date   heart by pass      Family History  Problem Relation Age of Onset   Heart disease Mother    Alcohol abuse Father    Alzheimer's disease Sister    Cancer Sister     Social History   Tobacco Use   Smoking status: Former    Packs/day: 0.50    Years: 60.00    Pack years: 30.00    Types: Cigarettes    Quit date: 08/07/2014    Years since quitting: 6.8   Smokeless tobacco: Former    Quit date: 05/11/2014  Vaping Use   Vaping Use: Never used  Substance Use Topics    Alcohol use: No   Drug use: No     Home Medications Prior to Admission medications   Medication Sig Start Date End Date Taking? Authorizing Provider  albuterol (PROVENTIL) (2.5 MG/3ML) 0.083% nebulizer solution Take 3 mLs (2.5 mg total) by nebulization every 2 (two) hours as needed for wheezing or shortness of breath. 03/27/20   Emokpae, Courage, MD  albuterol (VENTOLIN HFA) 108 (90 Base) MCG/ACT inhaler Inhale 2 puffs into the lungs every 6 (six) hours as needed. 03/27/20   Roxan Hockey, MD  apixaban (ELIQUIS) 2.5 MG TABS tablet Take 1 tablet (2.5 mg total) by mouth 2 (two) times daily. 03/08/21   [name], FNP  atorvastatin (LIPITOR) 40 MG tablet TAKE 1 TABLET ONCE DAILY IN THE EVENING 03/08/21   Hassell Done, Mary-Margaret, FNP  calcium-vitamin D (OSCAL 500/200 D-3) 500-200 MG-UNIT per tablet Take 1 tablet by mouth 2 (two) times daily. 02/27/15   Cherre Robins, PharmD  citalopram (CELEXA) 20 MG tablet Take 1 tablet (20 mg total) by mouth daily. 03/08/21   Hassell Done, Mary-Margaret, FNP  ezetimibe (ZETIA) 10 MG tablet Take 1 tablet (10 mg total) by mouth daily. 03/08/21   [name], FNP  Fluticasone-Umeclidin-Vilant (TRELEGY  ELLIPTA) 100-62.5-25 MCG/INH AEPB Inhale 1 puff into the lungs daily. 03/08/21   Hassell Done Mary-Margaret, FNP  furosemide (LASIX) 40 MG tablet Take 1 tablet (40 mg total) by mouth 2 (two) times daily. 01/15/21   Sueanne Margarita, MD  ipratropium-albuterol (DUONEB) 0.5-2.5 (3) MG/3ML SOLN Take 3 mLs by nebulization 2 (two) times daily. 03/27/20   Roxan Hockey, MD  metoprolol tartrate (LOPRESSOR) 25 MG tablet TAKE  (1)  TABLET TWICE A DAY. 03/08/21   Hassell Done, Mary-Margaret, FNP  mirtazapine (REMERON) 7.5 MG tablet Take 1 tablet (7.5 mg total) by mouth at bedtime. 03/08/21   [name], FNP  Nebulizers (COMPRESSOR/NEBULIZER) MISC 1 Units by Does not apply route 3 times/day as needed-between meals & bedtime. -Nebulizer machine with tubing supplies to be used for  administering nebulized medications as prescribed for COPD 03/27/20   Roxan Hockey, MD  nitroGLYCERIN (NITROSTAT) 0.4 MG SL tablet PLACE 1 TABLET UNDER THE TONGUE EVERY 5 MINUTES AS NEEDED FOR CHEST PA IN 11/29/20   [name], FNP  potassium chloride SA (KLOR-CON) 20 MEQ tablet TAKE  (1)  TABLET TWICE A DAY. 03/08/21   [name], FNP     Allergies    Patient has no known allergies.   Review of Systems   Review of Systems Unable to assess due to mental status.    Physical Exam BP (!) 147/100 (BP Location: Right Arm)   Pulse (!) 103   Temp (!) 97.5 F (36.4 C) (Oral)   Resp 17   Ht 5\' 3"  (1.6 m)   Wt 54 kg   SpO2 100%   BMI 21.09 kg/m   Physical Exam Vitals and nursing note reviewed.  Constitutional:      Appearance: Normal appearance.  HENT:     Head: Normocephalic and atraumatic.     Nose: Nose normal.     Mouth/Throat:     Mouth: Mucous membranes are dry.  Eyes:     Extraocular Movements: Extraocular movements intact.     Conjunctiva/sclera: Conjunctivae normal.  Cardiovascular:     Rate and Rhythm: Normal rate.  Pulmonary:     Effort: Pulmonary effort is normal.     Breath sounds: Rhonchi present.  Abdominal:     General: Abdomen is flat.     Palpations: Abdomen is soft.     Tenderness: There is no abdominal tenderness.  Musculoskeletal:        General: No swelling. Normal range of motion.     Cervical back: Neck supple.  Skin:    General: Skin is warm and dry.  Neurological:     General: No focal deficit present.     Mental Status: She is alert.  Psychiatric:        Mood and Affect: Mood normal.     ED Results / Procedures / Treatments   Labs (all labs ordered are listed, but only abnormal results are displayed) Labs Reviewed  RESP PANEL BY RT-PCR (FLU A&B, COVID) ARPGX2 - Abnormal; Notable for the following components:      Result Value   SARS Coronavirus 2 by RT PCR POSITIVE (*)    All other components within normal  limits  COMPREHENSIVE METABOLIC PANEL - Abnormal; Notable for the following components:   BUN 37 (*)    Creatinine, Ser 2.73 (*)    Calcium 7.6 (*)    Total Protein 5.9 (*)    Albumin 2.6 (*)    AST 48 (*)    GFR, Estimated 17 (*)  All other components within normal limits  CBC WITH DIFFERENTIAL/PLATELET - Abnormal; Notable for the following components:   RBC 3.49 (*)    Hemoglobin 10.7 (*)    HCT 32.9 (*)    Platelets 132 (*)    All other components within normal limits  C-REACTIVE PROTEIN - Abnormal; Notable for the following components:   CRP 2.4 (*)    All other components within normal limits  BRAIN NATRIURETIC PEPTIDE - Abnormal; Notable for the following components:   B Natriuretic Peptide 497.0 (*)    All other components within normal limits  FERRITIN - Abnormal; Notable for the following components:   Ferritin 884 (*)    All other components within normal limits  PROCALCITONIN  URINALYSIS, ROUTINE W REFLEX MICROSCOPIC  D-DIMER, QUANTITATIVE  CBC  COMPREHENSIVE METABOLIC PANEL    EKG EKG Interpretation  Date/Time:  Sunday June 10 2021 17:23:28 EDT Ventricular Rate:  102 PR Interval:    QRS Duration: 94 QT Interval:  353 QTC Calculation: 460 R Axis:   78 Text Interpretation: Atrial flutter Low voltage, extremity leads Nonspecific repol abnormality, diffuse leads No significant change since last tracing Confirmed by Calvert Cantor [phone]) on 06/10/2021 8:19:50 PM  Radiology DG Chest Port 1 View  Result Date: 06/10/2021 CLINICAL DATA:  Cough, weakness, COPD, COVID positive. EXAM: PORTABLE CHEST 1 VIEW COMPARISON:  04/19/2021 FINDINGS: The lungs are symmetrically well expanded. Previously noted multifocal pulmonary infiltrate throughout the left lung has resolved. Chronic interstitial infiltrate noted at the right lung base likely related to underlying interstitial lung disease. No superimposed focal pulmonary infiltrate identified. Mild biapical pleural  calcification again noted. No pneumothorax or pleural effusion. Coronary artery bypass grafting has been performed. Cardiac size is mildly enlarged, unchanged. Pulmonary vascularity is normal. No acute bone abnormality. IMPRESSION: No active disease.  Stable cardiomegaly. Electronically Signed   By: Fidela Salisbury M.D.   On: 06/10/2021 20:45    Procedures Procedures  Medications Ordered in the ED Medications  remdesivir 100 mg in sodium chloride 0.9 % 100 mL IVPB (has no administration in time range)    Followed by  remdesivir 100 mg in sodium chloride 0.9 % 100 mL IVPB (has no administration in time range)    Followed by  remdesivir 100 mg in sodium chloride 0.9 % 100 mL IVPB (has no administration in time range)  albuterol (VENTOLIN HFA) 108 (90 Base) MCG/ACT inhaler 2 puff (has no administration in time range)  guaiFENesin-dextromethorphan (ROBITUSSIN DM) 100-10 MG/5ML syrup 10 mL (has no administration in time range)  ascorbic acid (VITAMIN C) tablet 500 mg (has no administration in time range)  zinc sulfate capsule 220 mg (has no administration in time range)  acetaminophen (TYLENOL) tablet 650 mg (has no administration in time range)    Or  acetaminophen (TYLENOL) suppository 650 mg (has no administration in time range)  ondansetron (ZOFRAN) tablet 4 mg (has no administration in time range)    Or  ondansetron (ZOFRAN) injection 4 mg (has no administration in time range)  lactated ringers infusion (has no administration in time range)  ezetimibe (ZETIA) tablet 10 mg (has no administration in time range)  citalopram (CELEXA) tablet 20 mg (has no administration in time range)  atorvastatin (LIPITOR) tablet 40 mg (has no administration in time range)  apixaban (ELIQUIS) tablet 2.5 mg (has no administration in time range)  metoprolol tartrate (LOPRESSOR) tablet 25 mg (has no administration in time range)  nitroGLYCERIN (NITROSTAT) SL tablet 0.4 mg (has no administration in  time range)   mirtazapine (REMERON) tablet 7.5 mg (has no administration in time range)  umeclidinium bromide (INCRUSE ELLIPTA) 62.5 MCG/INH 1 puff (has no administration in time range)    And  fluticasone furoate-vilanterol (BREO ELLIPTA) 200-25 MCG/INH 1 puff (has no administration in time range)  lactated ringers bolus 1,000 mL (1,000 mLs Intravenous New Bag/Given 06/10/21 2125)     MDM Rules/Calculators/A&P MDM Per Son, patient has been weak the last few days, not eating or drinking much. She has been inconsistent with her medications. She has not had a fever. She has had a little bit of a cough, but as bad as when she had pneumonia. She still wears oxygen at times but not 24/7.   ED Course  I have reviewed the triage vital signs and the nursing notes.  Pertinent labs & imaging results that were available during my care of the patient were reviewed by me and considered in my medical decision making (see chart for details).  Clinical Course as of 06/10/21 2316  Sun Jun 10, 2021  2100 CBC is unremarkable. CXR is clear.  [CS]  2122 Patient with AKI on CMP, Covid is also confirmed positive. Symptoms have been going on ~7 days, will discuss admission with Hospitalist.  [CS]  2133 Spoke with Dr. Olevia Bowens, Hospitalist, who will admit the patient.  [CS]    Clinical Course User Index [CS] Truddie Hidden, MD    Final Clinical Impression(s) / ED Diagnoses Final diagnoses:  AKI (acute kidney injury) [facility])  COVID-19    Rx / DC Orders ED Discharge Orders     None        Truddie Hidden, MD 06/10/21 2316

## 2021-06-10 NOTE — ED Triage Notes (Signed)
Per EMS family wanted patient to come here due to patient not eating/drinking as much and having weakness for a week. Covid exposure one week ago. Respirations even and unlabored. NAD noted

## 2021-06-10 NOTE — H&P (Signed)
History and Physical    Laura Jacobs MHW:[id number] DOB: Sep 03, 1934 DOA: 06/10/2021  PCP: [name], FNP   Patient coming from: Home.   I have personally briefly reviewed patient's old medical records in [place]  Chief Complaint: Generalized weakness and decreased oral intake.  HPI: Laura Jacobs is a 87 y.o. female with medical history significant of aortic stenosis, CAD, history of NSTEMI, chronic atrial fibrillation on apixaban, chronic diastolic heart failure, history of systolic CHF with recovered EF, stage III CKD, COPD, gout, hyperlipidemia, hypertension, history of unspecified pneumonia, renal cysts and diabetes syndrome who is coming to the emergency department via EMS due to generalized weakness and decreased oral intake for the past week.  She had a COVID exposure about a week ago.  No fever and only mild cough according to what the family told Dr. Karle Starch.  She denied headache, chest back or abdominal pain this time.  No nausea or vomiting.  Diarrhea or constipation.  Denied dysuria, frequency or hematuria.  ED Course: Initial vital signs were 97.6 F, pulse 103, respiration 19, BP 120/82 mmHg O2 sat 98% on room air.  The patient received a 1000 mL LR bolus in the emergency department.  Lab work: CBC showed a white count of 5.7 with hemoglobin 10.7 g/dL platelets 132.  D-dimer was 0.75 mcg/mL.  CMP normal electrolytes when calcium is corrected to albumin.  BUN is 37, creatinine 2.73 mg/dL.  In the past 18 months or average creatinine has been below 1.8 mg/dL.  Total protein was 5.9 and albumin 2.6 g/dL.  AST is slightly elevated at 48 units/L.  CRP was 2.4 mg/dL.  BNP was 497.0 pg/mL.  Ferritin was 884 and procalcitonin was 0.41 ng/mL.  Coronavirus PCR was positive.  Imaging: One-view portable chest radiograph showed mild cardiomegaly which is unchanged and normal pulmonary vascularity.  There was no active cardiopulmonary disease.  Review of  Systems: As per HPI otherwise all other systems reviewed and are negative.  Past Medical History:  Diagnosis Date   Aortic stenosis    Moderate by echo >>low flow low gradient preserved EF (SVI < 35 and DI 0.34) by echo 02/2021   CAD (coronary artery disease)    s/p inferior MI 1998 with subsequent cath showing 50% stenosis ostial Left main, 60% stenosis proximal LAD, 90% stenosis mid LAD, occluded mid CFX, 70% stenosis proximal RCA, 80% stenosis ostial PDA, occluded OM 2 and underwent CABG w LIMA to D1-D2-LAD, SVG to OM-circ, SVG to PD-PL 05/23/97.   Chronic atrial fibrillation (HCC)    Chronic diastolic CHF (congestive heart failure) (HCC)    Chronic systolic CHF (congestive heart failure) ([place]) 02/09/2020   CKD (chronic kidney disease), stage III (HCC)    COPD (chronic obstructive pulmonary disease) (HCC)    Gout    Gout    Hyperlipidemia    Hypertension    Pneumonia    Renal cysts and diabetes syndrome (HCC)    Valvular heart disease    Past Surgical History:  Procedure Laterality Date   heart by pass     Social History  reports that she quit smoking about 6 years ago. Her smoking use included cigarettes. She has a 30.00 pack-year smoking history. She quit smokeless tobacco use about 7 years ago. She reports that she does not drink alcohol and does not use drugs.  No Known Allergies  Family History  Problem Relation Age of Onset   Heart disease Mother    Alcohol abuse Father  Alzheimer's disease Sister    Cancer Sister    Prior to Admission medications   Medication Sig Start Date End Date Taking? Authorizing Provider  albuterol (PROVENTIL) (2.5 MG/3ML) 0.083% nebulizer solution Take 3 mLs (2.5 mg total) by nebulization every 2 (two) hours as needed for wheezing or shortness of breath. 03/27/20   Emokpae, Courage, MD  albuterol (VENTOLIN HFA) 108 (90 Base) MCG/ACT inhaler Inhale 2 puffs into the lungs every 6 (six) hours as needed. 03/27/20   Roxan Hockey, MD  apixaban  (ELIQUIS) 2.5 MG TABS tablet Take 1 tablet (2.5 mg total) by mouth 2 (two) times daily. 03/08/21   [name], FNP  atorvastatin (LIPITOR) 40 MG tablet TAKE 1 TABLET ONCE DAILY IN THE EVENING 03/08/21   Hassell Done, Mary-Margaret, FNP  calcium-vitamin D (OSCAL 500/200 D-3) 500-200 MG-UNIT per tablet Take 1 tablet by mouth 2 (two) times daily. 02/27/15   Cherre Robins, PharmD  citalopram (CELEXA) 20 MG tablet Take 1 tablet (20 mg total) by mouth daily. 03/08/21   Hassell Done, Mary-Margaret, FNP  ezetimibe (ZETIA) 10 MG tablet Take 1 tablet (10 mg total) by mouth daily. 03/08/21   Hassell Done, Mary-Margaret, FNP  Fluticasone-Umeclidin-Vilant (TRELEGY ELLIPTA) 100-62.5-25 MCG/INH AEPB Inhale 1 puff into the lungs daily. 03/08/21   Hassell Done Mary-Margaret, FNP  furosemide (LASIX) 40 MG tablet Take 1 tablet (40 mg total) by mouth 2 (two) times daily. 01/15/21   Sueanne Margarita, MD  ipratropium-albuterol (DUONEB) 0.5-2.5 (3) MG/3ML SOLN Take 3 mLs by nebulization 2 (two) times daily. 03/27/20   Roxan Hockey, MD  metoprolol tartrate (LOPRESSOR) 25 MG tablet TAKE  (1)  TABLET TWICE A DAY. 03/08/21   Hassell Done, Mary-Margaret, FNP  mirtazapine (REMERON) 7.5 MG tablet Take 1 tablet (7.5 mg total) by mouth at bedtime. 03/08/21   [name], FNP  Nebulizers (COMPRESSOR/NEBULIZER) MISC 1 Units by Does not apply route 3 times/day as needed-between meals & bedtime. -Nebulizer machine with tubing supplies to be used for administering nebulized medications as prescribed for COPD 03/27/20   Roxan Hockey, MD  nitroGLYCERIN (NITROSTAT) 0.4 MG SL tablet PLACE 1 TABLET UNDER THE TONGUE EVERY 5 MINUTES AS NEEDED FOR CHEST PA IN 11/29/20   [name], FNP  potassium chloride SA (KLOR-CON) 20 MEQ tablet TAKE  (1)  TABLET TWICE A DAY. 03/08/21   [name], FNP   Physical Exam: Vitals:   06/10/21 2100 06/10/21 2130 06/10/21 2200 06/10/21 2238  BP: 94/62 131/73 124/79 (!) 147/100  Pulse: 89 95 85 (!) 103  Resp: 15  15 17    Temp:    (!) 97.5 F (36.4 C)  TempSrc:    Oral  SpO2: 98% 96% 98% 100%  Weight:      Height:       Constitutional: Frail, chronically ill-appearing elderly female.  Currently in NAD. Eyes: PERRL, lids and conjunctivae normal.  Anicteric sclera. ENMT: Mucous membranes and lips are mildly dry.  Posterior pharynx clear of any exudate or lesions. Neck: normal, supple, no masses, no thyromegaly Respiratory: Good air entry with bilateral rhonchi and subtle wheezing, no crackles. Normal respiratory effort. No accessory muscle use.  Cardiovascular: Regular rate and rhythm, 2/6 SEM, no rubs / gallops. No extremity edema. 2+ pedal pulses. No carotid bruits.  Abdomen: No distention.  Bowel sounds positive.  Soft, no tenderness, no masses palpated. No hepatosplenomegaly. Musculoskeletal: no clubbing / cyanosis. Good ROM, no contractures. Normal muscle tone.  Skin: Multiple areas of ecchymosis on all extremities. Neurologic: CN 2-12 grossly intact. Sensation intact,  DTR normal. Strength 5/5 in all 4.  Psychiatric:  Alert and oriented x 3. Normal mood.   Labs on Admission: I have personally reviewed following labs and imaging studies  CBC: Recent Labs  Lab 06/10/21 1829  WBC 5.7  NEUTROABS 4.1  HGB 10.7*  HCT 32.9*  MCV 94.3  PLT 132*    Basic Metabolic Panel: Recent Labs  Lab 06/10/21 1829  NA 135  K 3.8  CL 101  CO2 24  GLUCOSE 86  BUN 37*  CREATININE 2.73*  CALCIUM 7.6*    GFR: Estimated Creatinine Clearance: 12.5 mL/min (A) (by C-G formula based on SCr of 2.73 mg/dL (H)).  Liver Function Tests: Recent Labs  Lab 06/10/21 1829  AST 48*  ALT 14  ALKPHOS 71  BILITOT 0.8  PROT 5.9*  ALBUMIN 2.6*   Radiological Exams on Admission: DG Chest Port 1 View  Result Date: 06/10/2021 CLINICAL DATA:  Cough, weakness, COPD, COVID positive. EXAM: PORTABLE CHEST 1 VIEW COMPARISON:  04/19/2021 FINDINGS: The lungs are symmetrically well expanded. Previously noted multifocal  pulmonary infiltrate throughout the left lung has resolved. Chronic interstitial infiltrate noted at the right lung base likely related to underlying interstitial lung disease. No superimposed focal pulmonary infiltrate identified. Mild biapical pleural calcification again noted. No pneumothorax or pleural effusion. Coronary artery bypass grafting has been performed. Cardiac size is mildly enlarged, unchanged. Pulmonary vascularity is normal. No acute bone abnormality. IMPRESSION: No active disease.  Stable cardiomegaly. Electronically Signed   By: Fidela Salisbury M.D.   On: 06/10/2021 20:45    02/06/2021 echocardiogram IMPRESSIONS:   1. Left ventricular ejection fraction, by estimation, is 55 to 60%. The  left ventricle has normal function. The left ventricle has no regional  wall motion abnormalities. Left ventricular diastolic parameters are  indeterminate.   2. Right ventricular systolic function is low normal. The right  ventricular size is mildly enlarged. There is normal pulmonary artery  systolic pressure.   3. Left atrial size was severely dilated.   4. Right atrial size was severely dilated.   5. The mitral valve is normal in structure. No evidence of mitral valve  regurgitation. No evidence of mitral stenosis.   6. The aortic valve is tricuspid. There is moderate calcification of the  aortic valve. There is moderate thickening of the aortic valve. Aortic  valve regurgitation is not visualized. Mild aortic valve stenosis. Aortic  valve mean gradient measures 9.0  mmHg. Aortic valve peak gradient measures 15.4 mmHg. Aortic valve area, by  VTI measures 1.42 cm Low gradient perhaps due to low SVI of 29   7. The inferior vena cava is normal in size with greater than 50%  respiratory variability, suggesting right atrial pressure of 3 mmHg.   EKG: Independently reviewed.  Vent. rate 102 BPM PR interval * ms QRS duration 94 ms QT/QTcB 353/460 ms P-R-T axes * 78 262 Atrial  flutter Low voltage, extremity leads Nonspecific repol abnormality, diffuse leads  Assessment/Plan Principal Problem:   AKI (acute kidney injury) ([place]) Observation/telemetry. Received 1000 mL of LR bolus in the ED. Continue time-limited IV hydration. Avoid nephrotoxic agents. Avoid hypotension. Encourage oral intake. Monitor renal function electrolytes.  Active Problems:   COPD (chronic obstructive pulmonary disease) ([place]) Continue Trelegy Ellipta. Supplemental oxygen and SABA as needed.    Hyperlipidemia Continue atorvastatin 40 mg p.o. daily. Continue Zetia 10 mg p.o. daily.    Chronic kidney disease (CKD), stage IV (severe) (HCC) Monitor electrolytes and GFR. Avoid nephrotoxic agents.  CAD (coronary artery disease) On apixaban, metoprolol and statin.    Chronic atrial fibrillation (HCC) CHA?DS?-VASc Score of at least 6. Continue apixaban and metoprolol.    Aortic stenosis No signs of decompensation. Follow-up with cardiology as scheduled.    Chronic diastolic CHF (congestive heart failure) (HCC) No signs of decompensation.    DVT prophylaxis: On apixaban. Code Status:   Full code.  Family Communication:   Disposition Plan:   Patient is from:  Home.  Anticipated DC to:  TBD.  Anticipated DC date:  06/12/2021.  Anticipated DC barriers: Clinical status. Consults called:   Admission status:  Observation/telemetry.   Severity of Illness: High severity after presenting with generalized weakness and decreased oral intake for several days developing AKI and incidentally found to be COVID-19 positive.  The patient will remain for gentle/time-limited IV hydration and remdesivir per pharmacy.  Reubin Milan MD [facility]  How to contact the [facility] Attending or Consulting provider [place] or covering provider during after hours [place], for this patient?   Check the care team in [facility] and look for a) attending/consulting TRH provider listed and b) the [facility]  team listed Log into [email or web address] and use 's universal password to access. If you do not have the password, please contact the hospital operator. Locate the [facility] provider you are looking for under [facility] and page to a number that you can be directly reached. If you still have difficulty reaching the provider, please page the [facility] (Director on Call) for the Hospitalists listed on amion for assistance.  06/10/2021, 11:25 PM   This document was prepared using Dragon voice recognition software may contain some unintended transcription errors.

## 2021-06-10 NOTE — ED Notes (Signed)
Prior to putting in IV. Patient had skin tear to left forearm. Skin pinch together and steri strips applied to would at this time with large bandage. Patient denies hitting her arm.

## 2021-06-10 NOTE — Plan of Care (Signed)

## 2021-06-11 DIAGNOSIS — I5032 Chronic diastolic (congestive) heart failure: Secondary | ICD-10-CM

## 2021-06-11 DIAGNOSIS — Z6821 Body mass index (BMI) 21.0-21.9, adult: Secondary | ICD-10-CM | POA: Diagnosis not present

## 2021-06-11 DIAGNOSIS — Z515 Encounter for palliative care: Secondary | ICD-10-CM | POA: Diagnosis not present

## 2021-06-11 DIAGNOSIS — E1122 Type 2 diabetes mellitus with diabetic chronic kidney disease: Secondary | ICD-10-CM | POA: Diagnosis present

## 2021-06-11 DIAGNOSIS — I517 Cardiomegaly: Secondary | ICD-10-CM | POA: Diagnosis not present

## 2021-06-11 DIAGNOSIS — U071 COVID-19: Secondary | ICD-10-CM | POA: Diagnosis present

## 2021-06-11 DIAGNOSIS — Z87891 Personal history of nicotine dependence: Secondary | ICD-10-CM | POA: Diagnosis not present

## 2021-06-11 DIAGNOSIS — I130 Hypertensive heart and chronic kidney disease with heart failure and stage 1 through stage 4 chronic kidney disease, or unspecified chronic kidney disease: Secondary | ICD-10-CM | POA: Diagnosis present

## 2021-06-11 DIAGNOSIS — I5022 Chronic systolic (congestive) heart failure: Secondary | ICD-10-CM | POA: Diagnosis not present

## 2021-06-11 DIAGNOSIS — Z8249 Family history of ischemic heart disease and other diseases of the circulatory system: Secondary | ICD-10-CM | POA: Diagnosis not present

## 2021-06-11 DIAGNOSIS — R630 Anorexia: Secondary | ICD-10-CM | POA: Diagnosis not present

## 2021-06-11 DIAGNOSIS — E11649 Type 2 diabetes mellitus with hypoglycemia without coma: Secondary | ICD-10-CM | POA: Diagnosis present

## 2021-06-11 DIAGNOSIS — N184 Chronic kidney disease, stage 4 (severe): Secondary | ICD-10-CM

## 2021-06-11 DIAGNOSIS — M109 Gout, unspecified: Secondary | ICD-10-CM | POA: Diagnosis present

## 2021-06-11 DIAGNOSIS — I252 Old myocardial infarction: Secondary | ICD-10-CM | POA: Diagnosis not present

## 2021-06-11 DIAGNOSIS — I482 Chronic atrial fibrillation, unspecified: Secondary | ICD-10-CM

## 2021-06-11 DIAGNOSIS — E785 Hyperlipidemia, unspecified: Secondary | ICD-10-CM | POA: Diagnosis present

## 2021-06-11 DIAGNOSIS — Z7951 Long term (current) use of inhaled steroids: Secondary | ICD-10-CM | POA: Diagnosis not present

## 2021-06-11 DIAGNOSIS — F0390 Unspecified dementia without behavioral disturbance: Secondary | ICD-10-CM | POA: Diagnosis present

## 2021-06-11 DIAGNOSIS — R627 Adult failure to thrive: Secondary | ICD-10-CM | POA: Diagnosis not present

## 2021-06-11 DIAGNOSIS — N179 Acute kidney failure, unspecified: Secondary | ICD-10-CM | POA: Diagnosis not present

## 2021-06-11 DIAGNOSIS — I5042 Chronic combined systolic (congestive) and diastolic (congestive) heart failure: Secondary | ICD-10-CM | POA: Diagnosis present

## 2021-06-11 DIAGNOSIS — Z7189 Other specified counseling: Secondary | ICD-10-CM | POA: Diagnosis not present

## 2021-06-11 DIAGNOSIS — I2510 Atherosclerotic heart disease of native coronary artery without angina pectoris: Secondary | ICD-10-CM

## 2021-06-11 DIAGNOSIS — I350 Nonrheumatic aortic (valve) stenosis: Secondary | ICD-10-CM

## 2021-06-11 DIAGNOSIS — Z7901 Long term (current) use of anticoagulants: Secondary | ICD-10-CM | POA: Diagnosis not present

## 2021-06-11 DIAGNOSIS — J449 Chronic obstructive pulmonary disease, unspecified: Secondary | ICD-10-CM | POA: Diagnosis present

## 2021-06-11 DIAGNOSIS — Z951 Presence of aortocoronary bypass graft: Secondary | ICD-10-CM | POA: Diagnosis not present

## 2021-06-11 DIAGNOSIS — Z66 Do not resuscitate: Secondary | ICD-10-CM | POA: Diagnosis not present

## 2021-06-11 DIAGNOSIS — Z79899 Other long term (current) drug therapy: Secondary | ICD-10-CM | POA: Diagnosis not present

## 2021-06-11 LAB — CBC
HCT: 30 % — ABNORMAL LOW (ref 36.0–46.0)
Hemoglobin: 9.5 g/dL — ABNORMAL LOW (ref 12.0–15.0)
MCH: 29.8 pg (ref 26.0–34.0)
MCHC: 31.7 g/dL (ref 30.0–36.0)
MCV: 94 fL (ref 80.0–100.0)
Platelets: 110 10*3/uL — ABNORMAL LOW (ref 150–400)
RBC: 3.19 MIL/uL — ABNORMAL LOW (ref 3.87–5.11)
RDW: 14.6 % (ref 11.5–15.5)
WBC: 3.7 10*3/uL — ABNORMAL LOW (ref 4.0–10.5)
nRBC: 0 % (ref 0.0–0.2)

## 2021-06-11 LAB — COMPREHENSIVE METABOLIC PANEL
ALT: 11 U/L (ref 0–44)
AST: 38 U/L (ref 15–41)
Albumin: 2.3 g/dL — ABNORMAL LOW (ref 3.5–5.0)
Alkaline Phosphatase: 61 U/L (ref 38–126)
Anion gap: 9 (ref 5–15)
BUN: 33 mg/dL — ABNORMAL HIGH (ref 8–23)
CO2: 26 mmol/L (ref 22–32)
Calcium: 7.8 mg/dL — ABNORMAL LOW (ref 8.9–10.3)
Chloride: 103 mmol/L (ref 98–111)
Creatinine, Ser: 2.33 mg/dL — ABNORMAL HIGH (ref 0.44–1.00)
GFR, Estimated: 20 mL/min — ABNORMAL LOW (ref >60)
Glucose, Bld: 76 mg/dL (ref 70–99)
Potassium: 3.6 mmol/L (ref 3.5–5.1)
Sodium: 138 mmol/L (ref 135–145)
Total Bilirubin: 0.5 mg/dL (ref 0.3–1.2)
Total Protein: 5.1 g/dL — ABNORMAL LOW (ref 6.5–8.1)

## 2021-06-11 IMAGING — DX DG CHEST 2V
2 series · 2 of 2 positions shown · non-contrast
Comparison: Chest x-ray 08/29/2020.

CLINICAL DATA: 86-year-old female with history of shortness of
breath, cough and congestion.

EXAM:
CHEST - 2 VIEW

[chest pa]
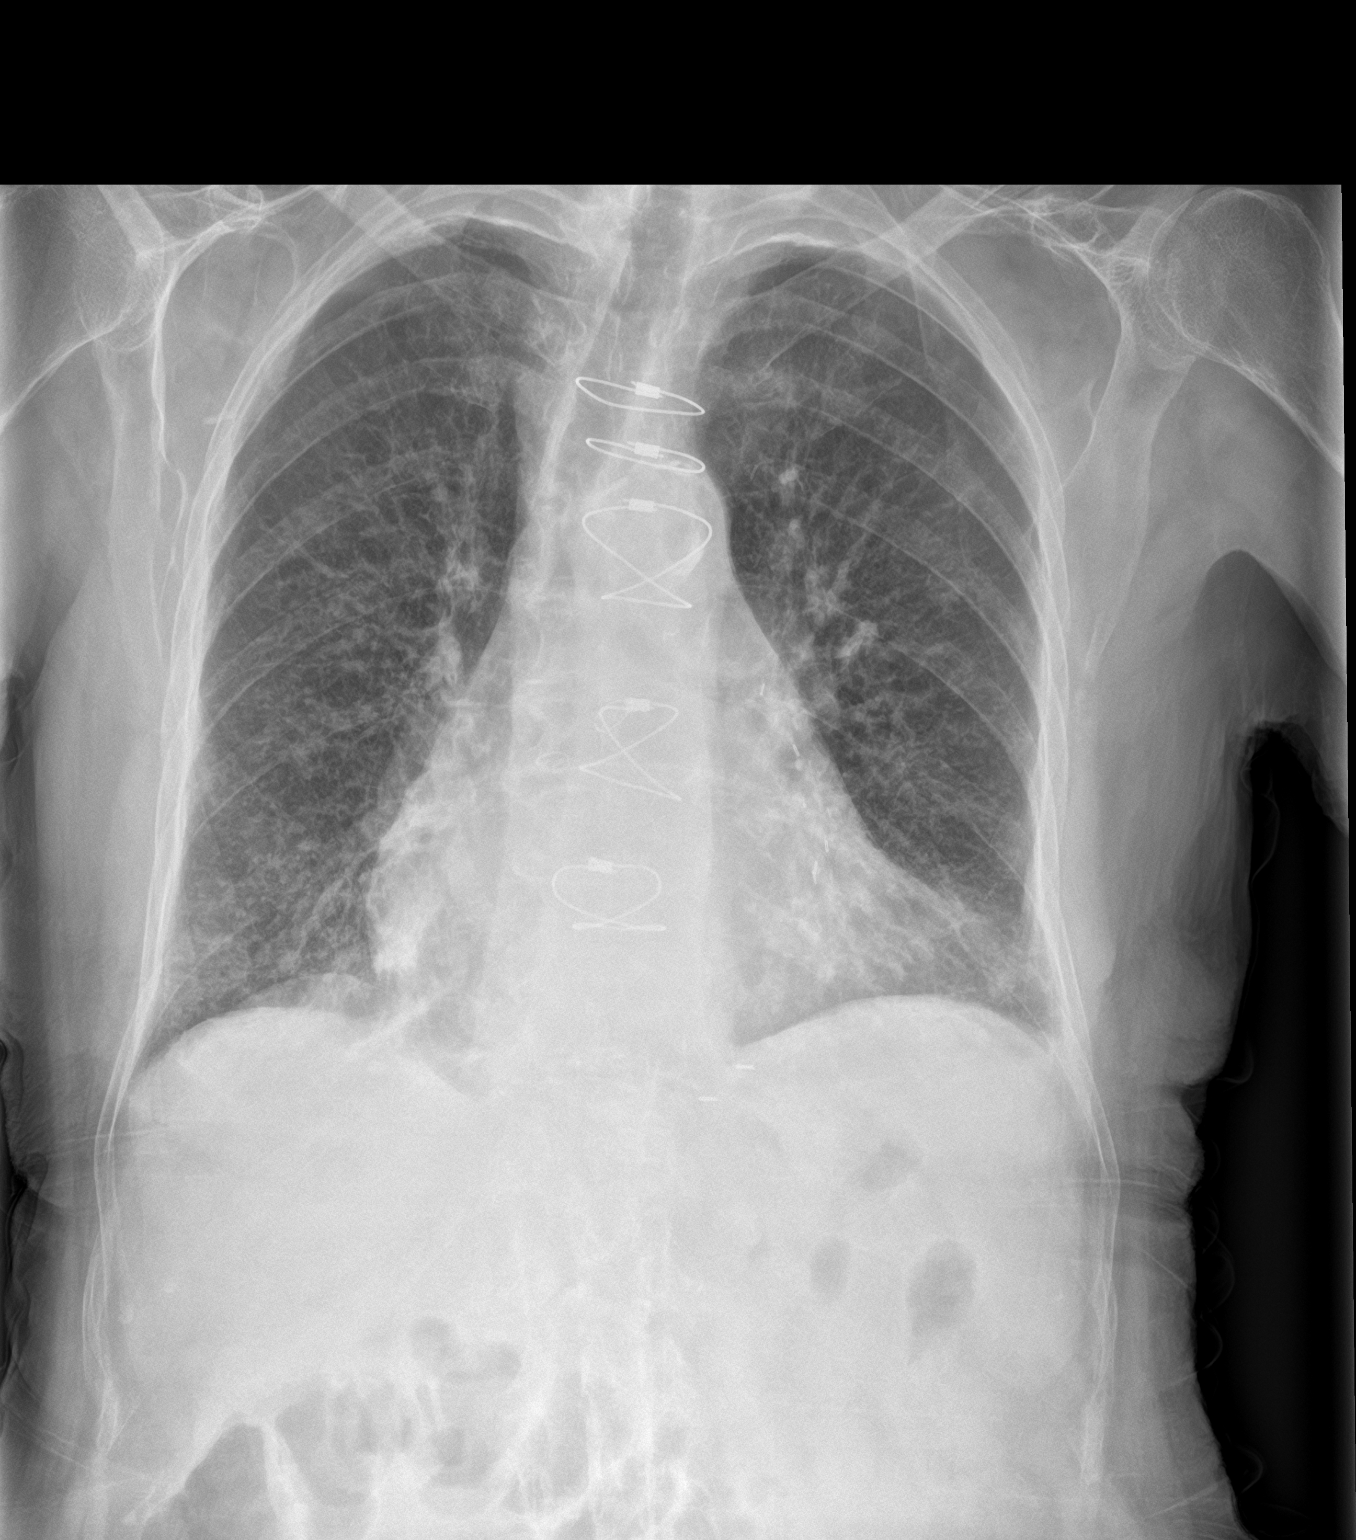

[chest lat]
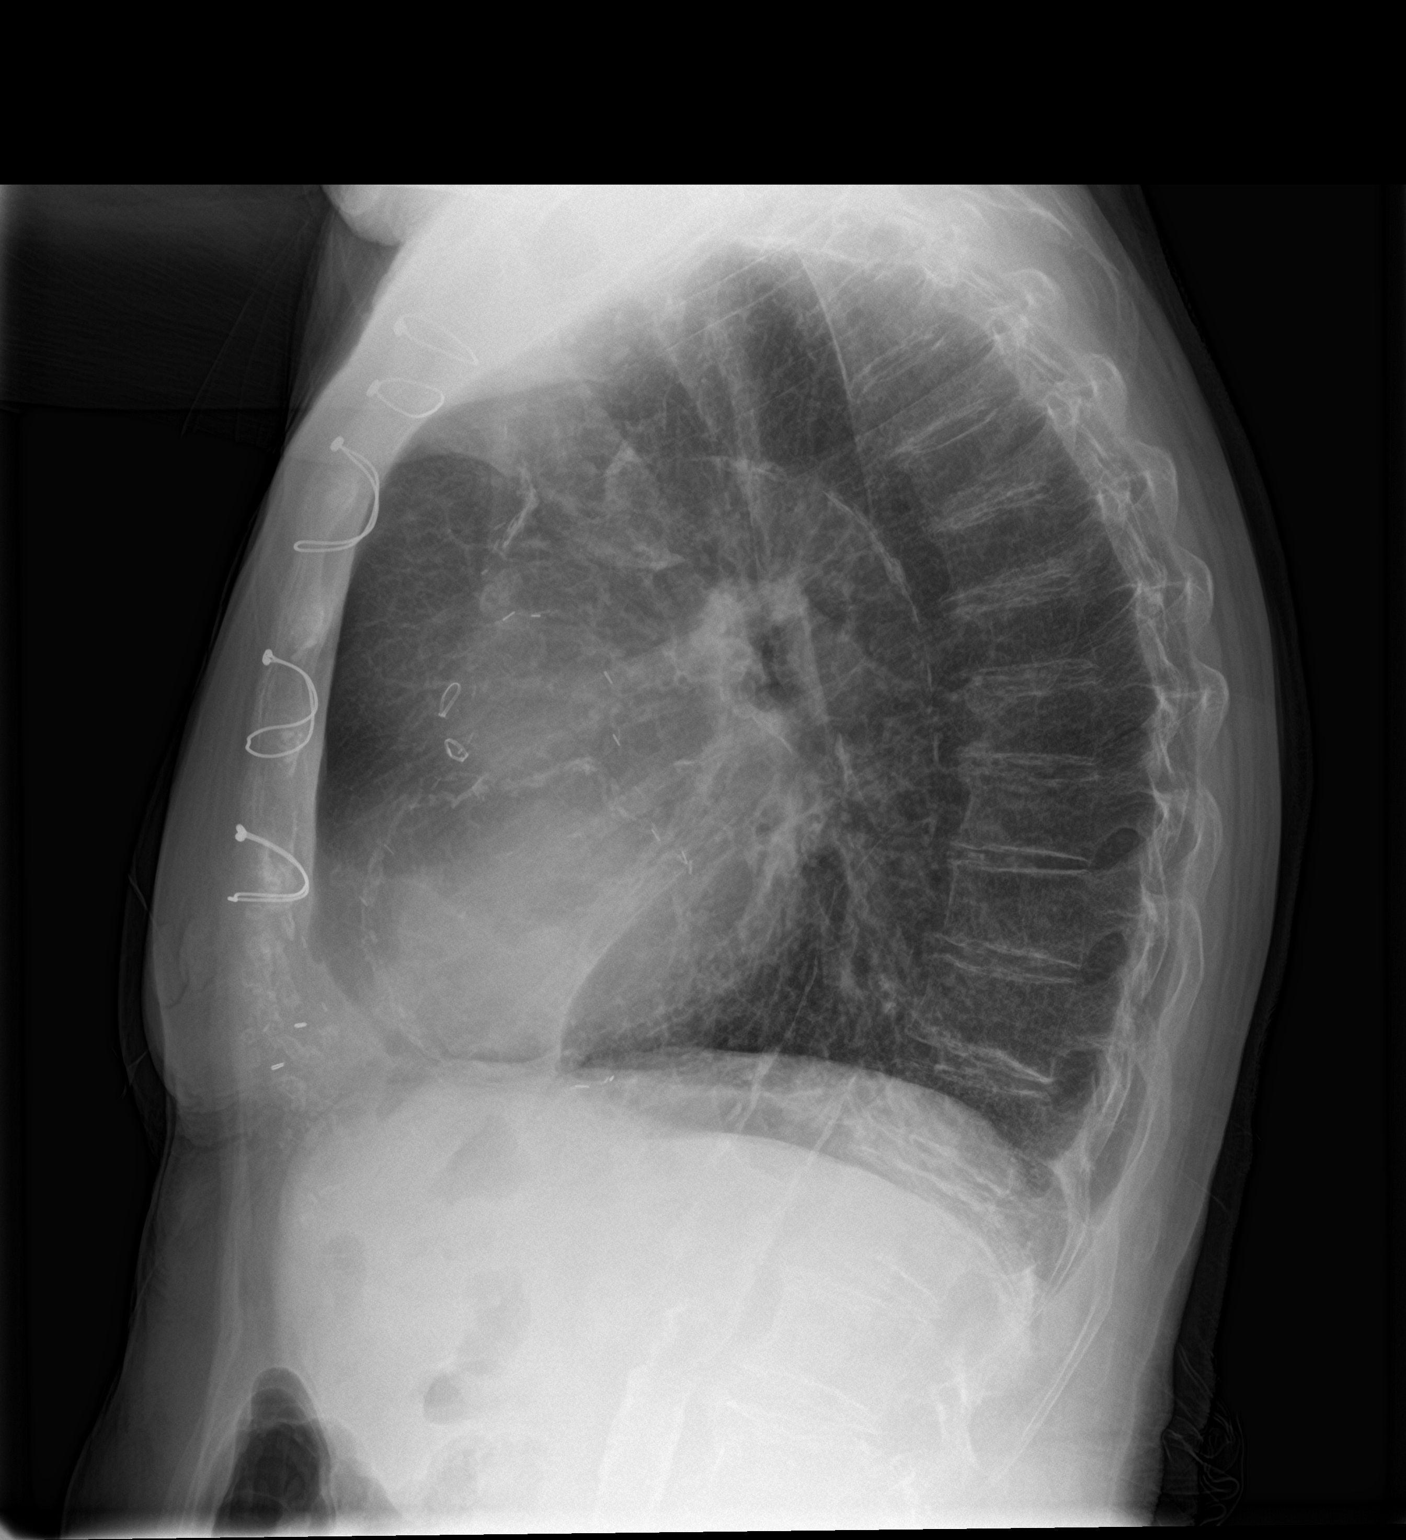

[2 of 2 positions shown; findings below may reference images not displayed]

FINDINGS: Diffuse peribronchial cuffing and widespread interstitial
prominence. Lung volumes are normal. No consolidative airspace
disease. No pleural effusions. No pneumothorax. No pulmonary nodule
or mass noted. Pulmonary vasculature and the cardiomediastinal
silhouette are within normal limits. Atherosclerosis in the thoracic
aorta. Status post median sternotomy for CABG.
IMPRESSION: 1. Diffuse peribronchial cuffing, concerning for an acute
bronchitis.
2. Aortic atherosclerosis.

## 2021-06-11 MED ORDER — METHYLPREDNISOLONE SODIUM SUCC 125 MG IJ SOLR: 125.0000 mg | Freq: Once | INTRAMUSCULAR | Status: DC | PRN

## 2021-06-11 MED ORDER — DIPHENHYDRAMINE HCL 50 MG/ML IJ SOLN: 50.0000 mg | Freq: Once | INTRAMUSCULAR | Status: DC | PRN

## 2021-06-11 MED ORDER — NYSTATIN 100000 UNIT/GM EX POWD
Freq: Three times a day (TID) | CUTANEOUS | Status: DC
  Filled 2021-06-11 (×5): qty 15

## 2021-06-11 MED ORDER — SODIUM CHLORIDE 0.9 % IV SOLN
INTRAVENOUS | Status: DC | PRN
Start: 2021-06-11 — End: 2021-06-12

## 2021-06-11 MED ORDER — ALBUTEROL SULFATE HFA 108 (90 BASE) MCG/ACT IN AERS: 2.0000 | INHALATION_SPRAY | Freq: Once | RESPIRATORY_TRACT | Status: DC | PRN

## 2021-06-11 MED ORDER — LACTATED RINGERS IV SOLN: INTRAVENOUS | Status: DC

## 2021-06-11 MED ORDER — EPINEPHRINE 0.3 MG/0.3ML IJ SOAJ: 0.3000 mg | Freq: Once | INTRAMUSCULAR | Status: DC | PRN

## 2021-06-11 MED ORDER — FAMOTIDINE IN NACL 20-0.9 MG/50ML-% IV SOLN: 20.0000 mg | Freq: Once | INTRAVENOUS | Status: DC | PRN

## 2021-06-11 MED ORDER — BEBTELOVIMAB 175 MG/2 ML IV (EUA)
175.0000 mg | Freq: Once | INTRAMUSCULAR | Status: DC
  Filled 2021-06-11: qty 2

## 2021-06-11 NOTE — Progress Notes (Signed)
PROGRESS NOTE   Laura Jacobs  WNU:[id number] DOB: August 03, 1934 DOA: 06/10/2021 PCP: [name], FNP   Chief Complaint  Patient presents with   Weakness   Level of care: Telemetry  Brief Admission History:   87 y.o. female with medical history significant of aortic stenosis, CAD, history of NSTEMI, chronic atrial fibrillation on apixaban, chronic diastolic heart failure, history of systolic CHF with recovered EF, stage III CKD, COPD, gout, hyperlipidemia, hypertension, history of unspecified pneumonia, renal cysts and diabetes syndrome who is coming to the emergency department via EMS due to generalized weakness and decreased oral intake for the past week.  She had a COVID exposure about a week ago.  No fever and only mild cough according to what the family told Dr. Karle Starch.  She denied headache, chest back or abdominal pain this time.  No nausea or vomiting.  Diarrhea or constipation.  Denied dysuria, frequency or hematuria. ED Course: Initial vital signs were 97.6 F, pulse 103, respiration 19, BP 120/82 mmHg O2 sat 98% on room air.  The patient received a 1000 mL LR bolus in the emergency department.   Lab work: CBC showed a white count of 5.7 with hemoglobin 10.7 g/dL platelets 132.  D-dimer was 0.75 mcg/mL.  CMP normal electrolytes when calcium is corrected to albumin.  BUN is 37, creatinine 2.73 mg/dL.  In the past 18 months or average creatinine has been below 1.8 mg/dL.  Total protein was 5.9 and albumin 2.6 g/dL.  AST is slightly elevated at 48 units/L.  CRP was 2.4 mg/dL.  BNP was 497.0 pg/mL.  Ferritin was 884 and procalcitonin was 0.41 ng/mL.  Coronavirus PCR was positive.  Imaging: One-view portable chest radiograph showed mild cardiomegaly which is unchanged and normal pulmonary vascularity.  There was no active cardiopulmonary disease.  Assessment & Plan:   Principal Problem:   AKI (acute kidney injury) ([place]) Active Problems:   Hyperlipidemia   Chronic kidney  disease (CKD), stage IV (severe) (HCC)   CAD (coronary artery disease)   Chronic atrial fibrillation (HCC)   Aortic stenosis   COPD (chronic obstructive pulmonary disease) (HCC)   Chronic diastolic CHF (congestive heart failure) (HCC)   Covid Infection - asymptomatic at this time; given advanced age and co-morbidities, will order a dose of monoclonal antibody for additional protection from worsening disease.    AKI - prenal - given IV fluids and slow improvement noted.  Follow renal function.    COPD - stable, resumed home bronchodilators.   Hyperlipidemia - resumed home lipid lowering agents.   AKI on Stage IV CKD - following closely.    CAD - resume home apixaban metoprolol statin  Chronic atrial fibrillation - resumed home meds.   Aortic stenosis - stable.   Chronic diastolic CHF - stable, follow clinically.    DVT prophylaxis: apixaban Code Status: Full  Family Communication:  Disposition: anticipating SNF Status is: Inpatient  Remains inpatient appropriate because:Inpatient level of care appropriate due to severity of illness  Dispo: The patient is from: Home              Anticipated d/c is to: SNF              Patient currently is not medically stable to d/c.   Difficult to place patient No  Consultants:    Procedures:    Antimicrobials:     Subjective: Pt sleeping, arousable but not answering questions  Objective: Vitals:   06/10/21 2130 06/10/21 2200 06/10/21 2238 06/11/21 0801  BP: 131/73 124/79 (!) 147/100   Pulse: 95 85 (!) 103   Resp: 15 17    Temp:   (!) 97.5 F (36.4 C)   TempSrc:   Oral   SpO2: 96% 98% 100% 97%  Weight:      Height:        Intake/Output Summary (Last 24 hours) at 06/11/2021 1518 Last data filed at 06/11/2021 0900 Gross per 24 hour  Intake 593.33 ml  Output --  Net 593.33 ml   Filed Weights   06/10/21 1719  Weight: 54 kg    Examination:  General exam: elderly frail but arousable female, Appears calm and  comfortable  Respiratory system: bibasilar crackles, Respiratory effort normal. Cardiovascular system: normal S1 & S2 heard. No JVD, murmurs, rubs, gallops or clicks. No pedal edema. Gastrointestinal system: Abdomen is nondistended, soft and nontender. No organomegaly or masses felt. Normal bowel sounds heard. Central nervous system: Alert and oriented. No focal neurological deficits. Extremities: Symmetric 5 x 5 power. Skin: No rashes, lesions or ulcers Psychiatry: Judgement and insight UTD. Mood & affect flat.   Data Reviewed: I have personally reviewed following labs and imaging studies  CBC: Recent Labs  Lab 06/10/21 1829 06/11/21 0539  WBC 5.7 3.7*  NEUTROABS 4.1  --   HGB 10.7* 9.5*  HCT 32.9* 30.0*  MCV 94.3 94.0  PLT 132* 110*    Basic Metabolic Panel: Recent Labs  Lab 06/10/21 1829 06/11/21 0539  NA 135 138  K 3.8 3.6  CL 101 103  CO2 24 26  GLUCOSE 86 76  BUN 37* 33*  CREATININE 2.73* 2.33*  CALCIUM 7.6* 7.8*    GFR: Estimated Creatinine Clearance: 14.6 mL/min (A) (by C-G formula based on SCr of 2.33 mg/dL (H)).  Liver Function Tests: Recent Labs  Lab 06/10/21 1829 06/11/21 0539  AST 48* 38  ALT 14 11  ALKPHOS 71 61  BILITOT 0.8 0.5  PROT 5.9* 5.1*  ALBUMIN 2.6* 2.3*    CBG: No results for input(s): GLUCAP in the last 168 hours.  Recent Results (from the past 240 hour(s))  Resp Panel by RT-PCR (Flu A&B, Covid) Nasopharyngeal Swab     Status: Abnormal   Collection Time: 06/10/21  6:29 PM   Specimen: Nasopharyngeal Swab; Nasopharyngeal(NP) swabs in vial transport medium  Result Value Ref Range Status   SARS Coronavirus 2 by RT PCR POSITIVE (A) NEGATIVE Final    Comment: CRITICAL RESULT CALLED TO, READ BACK BY AND VERIFIED WITH: [place] 06/10/2021 COLEMAN,R (NOTE) SARS-CoV-2 target nucleic acids are DETECTED.  The SARS-CoV-2 RNA is generally detectable in upper respiratory specimens during the acute phase of infection. Positive  results are indicative of the presence of the identified virus, but do not rule out bacterial infection or co-infection with other pathogens not detected by the test. Clinical correlation with patient history and other diagnostic information is necessary to determine patient infection status. The expected result is Negative.  Fact Sheet for Patients: EntrepreneurPulse.com.au  Fact Sheet for Healthcare Providers: IncredibleEmployment.be  This test is not yet approved or cleared by the [place] FDA and  has been authorized for detection and/or diagnosis of SARS-CoV-2 by FDA under an Emergency Use Authorization (EUA).  This EUA will remain in effect (meaning this t est can be used) for the duration of  the COVID-19 declaration under Section 564(b)(1) of the Act, 21 U.S.C. section 360bbb-3(b)(1), unless the authorization is terminated or revoked sooner.     Influenza A by PCR NEGATIVE  NEGATIVE Final   Influenza B by PCR NEGATIVE NEGATIVE Final    Comment: (NOTE) The Xpert Xpress SARS-CoV-2/FLU/RSV plus assay is intended as an aid in the diagnosis of influenza from Nasopharyngeal swab specimens and should not be used as a sole basis for treatment. Nasal washings and aspirates are unacceptable for Xpert Xpress SARS-CoV-2/FLU/RSV testing.  Fact Sheet for Patients: EntrepreneurPulse.com.au  Fact Sheet for Healthcare Providers: IncredibleEmployment.be  This test is not yet approved or cleared by the [place] FDA and has been authorized for detection and/or diagnosis of SARS-CoV-2 by FDA under an Emergency Use Authorization (EUA). This EUA will remain in effect (meaning this test can be used) for the duration of the COVID-19 declaration under Section 564(b)(1) of the Act, 21 U.S.C. section 360bbb-3(b)(1), unless the authorization is terminated or revoked.  Performed at [facility], [address],  [place], [place] [address]      Radiology Studies: [facility] Chest [facility] 1 View  Result Date: 06/10/2021 CLINICAL DATA:  Cough, weakness, COPD, COVID positive. EXAM: PORTABLE CHEST 1 VIEW COMPARISON:  04/19/2021 FINDINGS: The lungs are symmetrically well expanded. Previously noted multifocal pulmonary infiltrate throughout the left lung has resolved. Chronic interstitial infiltrate noted at the right lung base likely related to underlying interstitial lung disease. No superimposed focal pulmonary infiltrate identified. Mild biapical pleural calcification again noted. No pneumothorax or pleural effusion. Coronary artery bypass grafting has been performed. Cardiac size is mildly enlarged, unchanged. Pulmonary vascularity is normal. No acute bone abnormality. IMPRESSION: No active disease.  Stable cardiomegaly. Electronically Signed   By: Fidela Salisbury M.D.   On: 06/10/2021 20:45    Scheduled Meds:  apixaban  2.5 mg Oral BID   vitamin C  500 mg Oral Daily   atorvastatin  40 mg Oral Daily   bebtelovimab EUA  175 mg Intravenous Once   citalopram  20 mg Oral Daily   ezetimibe  10 mg Oral Daily   umeclidinium bromide  1 puff Inhalation Daily   And   fluticasone furoate-vilanterol  1 puff Inhalation Daily   metoprolol tartrate  25 mg Oral BID   mirtazapine  7.5 mg Oral QHS   nystatin   Topical TID   zinc sulfate  220 mg Oral Daily   Continuous Infusions:  sodium chloride     famotidine (PEPCID) IV     lactated ringers 35 mL/hr at 06/11/21 1443   remdesivir 100 mg in NS 100 mL       LOS: 0 days   Time spent: 38 mins   Idonia Zollinger Wynetta Emery, MD How to contact the [facility] Attending or Consulting provider [place] or covering provider during after hours [place], for this patient?  Check the care team in [facility] and look for a) attending/consulting TRH provider listed and b) the [facility] team listed Log into [email or web address] and use [place]'s universal password to access. If you do not have the password, please contact the  hospital operator. Locate the [facility] provider you are looking for under [facility] and page to a number that you can be directly reached. If you still have difficulty reaching the provider, please page the [facility] (Director on Call) for the Hospitalists listed on amion for assistance.  06/11/2021, 3:18 PM

## 2021-06-11 NOTE — Evaluation (Signed)
Physical Therapy Evaluation Patient Details Name: Laura Jacobs MRN: [id number] DOB: 1934-07-02 Today's Date: 06/11/2021   History of Present Illness  Laura Jacobs is a 87 y.o. female with medical history significant of aortic stenosis, CAD, history of NSTEMI, chronic atrial fibrillation on apixaban, chronic diastolic heart failure, history of systolic CHF with recovered EF, stage III CKD, COPD, gout, hyperlipidemia, hypertension, history of unspecified pneumonia, renal cysts and diabetes syndrome who is coming to the emergency department via EMS due to generalized weakness and decreased oral intake for the past week.  She had a COVID exposure about a week ago.  No fever and only mild cough according to what the family told Dr. Karle Starch.  She denied headache, chest back or abdominal pain this time.  No nausea or vomiting.  Diarrhea or constipation.  Denied dysuria, frequency or hematuria.   Clinical Impression  Patient limited for functional mobility as stated below secondary to BLE weakness, fatigue and impaired balance. Patient appears to be a questionable historian of home set up/PLOF. Patient requires assist to transition to seated EOB  with slow, labored movements. She demonstrates fair sitting balance and is limited by fatigue. Patient will benefit from continued physical therapy in hospital and recommended venue below to increase strength, balance, endurance for safe ADLs and gait.     Follow Up Recommendations SNF    Equipment Recommendations  None recommended by PT    Recommendations for Other Services       Precautions / Restrictions Precautions Precautions: Fall Restrictions Weight Bearing Restrictions: No      Mobility  Bed Mobility Overal bed mobility: Needs Assistance Bed Mobility: Supine to Sit;Sit to Supine     Supine to sit: Min assist;Mod assist Sit to supine: Min assist   General bed mobility comments: slow, labored transition to seated EOB     Transfers                    Ambulation/Gait                Stairs            Wheelchair Mobility    Modified Rankin (Stroke Patients Only)       Balance Overall balance assessment: Needs assistance Sitting-balance support: Bilateral upper extremity supported Sitting balance-Leahy Scale: Fair Sitting balance - Comments: seated EOB                                     Pertinent Vitals/Pain Pain Assessment: No/denies pain    Home Living Family/patient expects to be discharged to:: Private residence                 Additional Comments: Patient is a questionable historian of home set up/PLOF    Prior Function           Comments: Patient is a questionable historian of home set up/PLOF     Hand Dominance        Extremity/Trunk Assessment   Upper Extremity Assessment Upper Extremity Assessment: Generalized weakness    Lower Extremity Assessment Lower Extremity Assessment: Generalized weakness    Cervical / Trunk Assessment Cervical / Trunk Assessment: Normal  Communication   Communication: HOH  Cognition Arousal/Alertness: Awake/alert Behavior During Therapy: WFL for tasks assessed/performed Overall Cognitive Status: Within Functional Limits for tasks assessed  General Comments      Exercises     Assessment/Plan    PT Assessment Patient needs continued PT services  PT Problem List Decreased strength;Decreased mobility;Decreased activity tolerance;Decreased balance;Cardiopulmonary status limiting activity       PT Treatment Interventions DME instruction;Therapeutic exercise;Gait training;Balance training;Stair training;Neuromuscular re-education;Functional mobility training;Therapeutic activities;Patient/family education    PT Goals (Current goals can be found in the Care Plan section)  Acute Rehab PT Goals Patient Stated Goal: Return home PT Goal  Formulation: With patient Time For Goal Achievement: 06/25/21 Potential to Achieve Goals: Fair    Frequency Min 2X/week   Barriers to discharge        Co-evaluation               AM-PAC PT "6 Clicks" Mobility  Outcome Measure Help needed turning from your back to your side while in a flat bed without using bedrails?: A Little Help needed moving from lying on your back to sitting on the side of a flat bed without using bedrails?: A Little Help needed moving to and from a bed to a chair (including a wheelchair)?: A Lot Help needed standing up from a chair using your arms (e.g., wheelchair or bedside chair)?: A Lot Help needed to walk in hospital room?: A Lot Help needed climbing 3-5 steps with a railing? : A Lot 6 Click Score: 14    End of Session   Activity Tolerance: Patient limited by fatigue Patient left: in bed;with call bell/phone within reach;with bed alarm set Nurse Communication: Mobility status PT Visit Diagnosis: Unsteadiness on feet (R26.81);Other abnormalities of gait and mobility (R26.89);Muscle weakness (generalized) (M62.81)    Time: 1025-1036 PT Time Calculation (min) (ACUTE ONLY): 11 min   Charges:   PT Evaluation $PT Eval Low Complexity: 1 Low          11:01 AM, 06/11/21 Mearl Latin PT, DPT Physical Therapist at [facility]

## 2021-06-11 NOTE — Plan of Care (Signed)
  Problem: Acute Rehab PT Goals(only PT should resolve) Goal: Pt Will Go Supine/Side To Sit Outcome: Progressing Flowsheets (Taken 06/11/2021 1103) Pt will go Supine/Side to Sit: with supervision Goal: Pt Will Go Sit To Supine/Side Outcome: Progressing Flowsheets (Taken 06/11/2021 1103) Pt will go Sit to Supine/Side: with supervision Goal: Patient Will Transfer Sit To/From Stand Outcome: Progressing Flowsheets (Taken 06/11/2021 1103) Patient will transfer sit to/from stand: with minimal assist Goal: Pt Will Transfer Bed To Chair/Chair To Bed Outcome: Progressing Flowsheets (Taken 06/11/2021 1103) Pt will Transfer Bed to Chair/Chair to Bed: with min assist Goal: Pt Will Ambulate Outcome: Progressing Flowsheets (Taken 06/11/2021 1103) Pt will Ambulate:  25 feet  with minimal assist  with least restrictive assistive device Goal: Pt/caregiver will Perform Home Exercise Program Outcome: Progressing Flowsheets (Taken 06/11/2021 1103) Pt/caregiver will Perform Home Exercise Program:  For increased strengthening  For improved balance  Independently  11:03 AM, 06/11/21 Mearl Latin PT, DPT Physical Therapist at [facility]

## 2021-06-11 NOTE — TOC Initial Note (Signed)
Transition of Care [facility]) - Initial/Assessment Note    Patient Details  Name: Laura Jacobs MRN: [id number] Date of Birth: 05/04/1934  Transition of [facility]) CM/SW Contact:    Laura Gully, LCSW Phone Number: 06/11/2021, 2:37 PM  Clinical Narrative:                 Patient from home with two sons. Admitted with AKI. Uses oxygen PRN. Uses walker or stand by assist for ambulation. Has BSC. Completes bathing with assistance of daughter-in-law at times or independently. Discussed SNF recommendation. Jeneen Rinks feels family would prefer HHPT. Will discuss with brothers and make final decisions.    Expected Discharge Plan: [place] Barriers to Discharge: Continued Medical Work up   Patient Goals and CMS Choice Patient states their goals for this hospitalization and ongoing recovery are:: home with hh      Expected Discharge Plan and Services Expected Discharge Plan: [place] Acute Care Choice: Home Health                                        Prior Living Arrangements/Services   Lives with:: Adult Children Patient language and need for interpreter reviewed:: Yes Do you feel safe going back to the place where you live?: Yes      Need for Family Participation in Patient Care: Yes (Comment) Care giver support system in place?: Yes (comment) Current home services: DME Criminal Activity/Legal Involvement Pertinent to Current Situation/Hospitalization: No - Comment as needed  Activities of Daily Living      Permission Sought/Granted Permission sought to share information with : Family Supports    Share Information with NAME: Geniva Lohnes     Permission granted to share info w Relationship: son     Emotional Assessment       Orientation: : Oriented to Self, Oriented to Place Alcohol / Substance Use: Not Applicable Psych Involvement: No (comment)  Admission diagnosis:  AKI (acute kidney injury) ([place])  [N17.9] COVID-19 [U07.1] Patient Active Problem List   Diagnosis Date Noted   AKI (acute kidney injury) ([place]) 06/10/2021   Chronic diastolic CHF (congestive heart failure) ([place])    Acute hypoxemic respiratory failure ([place]) 04/19/2021   COPD (chronic obstructive pulmonary disease) ([place]) 03/08/2021   Aortic stenosis    Chronic atrial fibrillation ([place])    Osteoporosis 02/27/2015   Low serum vitamin D 02/27/2015   Paroxysmal atrial fibrillation ([place])    Long term current use of anticoagulant therapy    Lung nodule 08/23/2014   CAD (coronary artery disease) 08/11/2013   Gout 05/10/2013   Hyperlipidemia 02/04/2013   Chronic kidney disease (CKD), stage IV (severe) ([place]) 02/04/2013   Hypertensive heart disease     PCP:  [name], [place] Pharmacy:   [place], [place] [address] [place] [address] Phone: [phone] Fax: [phone]     Social Determinants of Health (SDOH) Interventions    Readmission Risk Interventions No flowsheet data found.

## 2021-06-12 ENCOUNTER — Ambulatory Visit: Payer: Self-pay | Admitting: Nurse Practitioner

## 2021-06-12 LAB — BASIC METABOLIC PANEL
Anion gap: 8 (ref 5–15)
BUN: 34 mg/dL — ABNORMAL HIGH (ref 8–23)
CO2: 24 mmol/L (ref 22–32)
Calcium: 7.5 mg/dL — ABNORMAL LOW (ref 8.9–10.3)
Chloride: 105 mmol/L (ref 98–111)
Creatinine, Ser: 2.05 mg/dL — ABNORMAL HIGH (ref 0.44–1.00)
GFR, Estimated: 23 mL/min — ABNORMAL LOW (ref >60)
Glucose, Bld: 51 mg/dL — ABNORMAL LOW (ref 70–99)
Potassium: 3.3 mmol/L — ABNORMAL LOW (ref 3.5–5.1)
Sodium: 137 mmol/L (ref 135–145)

## 2021-06-12 LAB — CBC
HCT: 32 % — ABNORMAL LOW (ref 36.0–46.0)
Hemoglobin: 10.1 g/dL — ABNORMAL LOW (ref 12.0–15.0)
MCH: 30.1 pg (ref 26.0–34.0)
MCHC: 31.6 g/dL (ref 30.0–36.0)
MCV: 95.2 fL (ref 80.0–100.0)
Platelets: 110 10*3/uL — ABNORMAL LOW (ref 150–400)
RBC: 3.36 MIL/uL — ABNORMAL LOW (ref 3.87–5.11)
RDW: 14.7 % (ref 11.5–15.5)
WBC: 3.5 10*3/uL — ABNORMAL LOW (ref 4.0–10.5)
nRBC: 0 % (ref 0.0–0.2)

## 2021-06-12 LAB — GLUCOSE, CAPILLARY
Glucose-Capillary: 38 mg/dL — CL (ref 70–99)
Glucose-Capillary: 121 mg/dL — ABNORMAL HIGH (ref 70–99)
Glucose-Capillary: 114 mg/dL — ABNORMAL HIGH (ref 70–99)
Glucose-Capillary: 71 mg/dL (ref 70–99)
Glucose-Capillary: 84 mg/dL (ref 70–99)

## 2021-06-12 LAB — C-REACTIVE PROTEIN: CRP: 1.6 mg/dL — ABNORMAL HIGH (ref <1.0)

## 2021-06-12 LAB — D-DIMER, QUANTITATIVE: D-Dimer, Quant: 0.48 ug/mL-FEU (ref 0.00–0.50)

## 2021-06-12 MED ORDER — DEXTROSE 50 % IV SOLN
1.0000 | INTRAVENOUS | Status: AC
  Administered 2021-06-12: 50 mL via INTRAVENOUS

## 2021-06-12 MED ORDER — POTASSIUM CHLORIDE CRYS ER 20 MEQ PO TBCR
20.0000 meq | EXTENDED_RELEASE_TABLET | Freq: Three times a day (TID) | ORAL | Status: AC
  Administered 2021-06-12 – 2021-06-13 (×3): 20 meq via ORAL
  Filled 2021-06-12 (×2): qty 1
  Filled 2021-06-12: qty 2

## 2021-06-12 MED ORDER — DEXTROSE 50 % IV SOLN
INTRAVENOUS | Status: AC
  Filled 2021-06-12: qty 50

## 2021-06-12 MED ORDER — NIRMATRELVIR/RITONAVIR (PAXLOVID) TABLET (RENAL DOSING): 2.0000 | ORAL_TABLET | Freq: Two times a day (BID) | ORAL | Status: DC

## 2021-06-12 MED ORDER — DEXTROSE-NACL 5-0.9 % IV SOLN: INTRAVENOUS | Status: DC

## 2021-06-12 NOTE — Plan of Care (Signed)
Pt is turned and repositioned every two hours.  Bed alarm remains intact for safety.  Problem: Education: Goal: Knowledge of General Education information will improve Description: Including pain rating scale, medication(s)/side effects and non-pharmacologic comfort measures Outcome: Progressing   Problem: Health Behavior/Discharge Planning: Goal: Ability to manage health-related needs will improve Outcome: Progressing   Problem: Clinical Measurements: Goal: Ability to maintain clinical measurements within normal limits will improve Outcome: Progressing Goal: Will remain free from infection Outcome: Progressing Goal: Diagnostic test results will improve Outcome: Progressing Goal: Respiratory complications will improve Outcome: Progressing Goal: Cardiovascular complication will be avoided Outcome: Progressing   Problem: Activity: Goal: Risk for activity intolerance will decrease Outcome: Progressing   Problem: Nutrition: Goal: Adequate nutrition will be maintained Outcome: Progressing   Problem: Coping: Goal: Level of anxiety will decrease Outcome: Progressing   Problem: Elimination: Goal: Will not experience complications related to bowel motility Outcome: Progressing Goal: Will not experience complications related to urinary retention Outcome: Progressing   Problem: Pain Managment: Goal: General experience of comfort will improve Outcome: Progressing   Problem: Safety: Goal: Ability to remain free from injury will improve Outcome: Progressing   Problem: Skin Integrity: Goal: Risk for impaired skin integrity will decrease Outcome: Progressing   Problem: Education: Goal: Knowledge of General Education information will improve Description: Including pain rating scale, medication(s)/side effects and non-pharmacologic comfort measures Outcome: Progressing   Problem: Health Behavior/Discharge Planning: Goal: Ability to manage health-related needs will  improve Outcome: Progressing   Problem: Clinical Measurements: Goal: Ability to maintain clinical measurements within normal limits will improve Outcome: Progressing Goal: Will remain free from infection Outcome: Progressing Goal: Diagnostic test results will improve Outcome: Progressing Goal: Respiratory complications will improve Outcome: Progressing Goal: Cardiovascular complication will be avoided Outcome: Progressing   Problem: Activity: Goal: Risk for activity intolerance will decrease Outcome: Progressing   Problem: Nutrition: Goal: Adequate nutrition will be maintained Outcome: Progressing   Problem: Coping: Goal: Level of anxiety will decrease Outcome: Progressing   Problem: Elimination: Goal: Will not experience complications related to bowel motility Outcome: Progressing Goal: Will not experience complications related to urinary retention Outcome: Progressing   Problem: Pain Managment: Goal: General experience of comfort will improve Outcome: Progressing   Problem: Safety: Goal: Ability to remain free from injury will improve Outcome: Progressing   Problem: Skin Integrity: Goal: Risk for impaired skin integrity will decrease Outcome: Progressing

## 2021-06-12 NOTE — TOC Progression Note (Signed)
Transition of Care [facility]) - Progression Note    Patient Details  Name: ILEE RANDLEMAN MRN: [id number] Date of Birth: 1933/10/28  Transition of Care [facility]) CM/SW Contact  Ihor Gully, LCSW Phone Number: 06/12/2021, 4:47 PM  Clinical Narrative:    Dan Humphreys, advises that after speaking with family, they desire patient to d/c with [place]. Indicated that his sister in law will assist with ADLs.    Expected Discharge Plan: [place] Barriers to Discharge: Continued Medical Work up  Expected Discharge Plan and Services Expected Discharge Plan: [place] Choice: Home Health                                         Social Determinants of Health (SDOH) Interventions    Readmission Risk Interventions No flowsheet data found.

## 2021-06-12 NOTE — Progress Notes (Signed)
PROGRESS NOTE   Laura Jacobs  ZOX:[id number] DOB: 1934-01-29 DOA: 06/10/2021 PCP: [name], FNP   Chief Complaint  Patient presents with   Weakness   Level of care: Telemetry  Brief Admission History:   87 y.o. female with medical history significant of aortic stenosis, CAD, history of NSTEMI, chronic atrial fibrillation on apixaban, chronic diastolic heart failure, history of systolic CHF with recovered EF, stage III CKD, COPD, gout, hyperlipidemia, hypertension, history of unspecified pneumonia, renal cysts and diabetes syndrome who is coming to the emergency department via EMS due to generalized weakness and decreased oral intake for the past week.  She had a COVID exposure about a week ago.  No fever and only mild cough according to what the family told Dr. Karle Starch.  She denied headache, chest back or abdominal pain this time.  No nausea or vomiting.  Diarrhea or constipation.  Denied dysuria, frequency or hematuria. ED Course: Initial vital signs were 97.6 F, pulse 103, respiration 19, BP 120/82 mmHg O2 sat 98% on room air.  The patient received a 1000 mL LR bolus in the emergency department.   Lab work: CBC showed a white count of 5.7 with hemoglobin 10.7 g/dL platelets 132.  D-dimer was 0.75 mcg/mL.  CMP normal electrolytes when calcium is corrected to albumin.  BUN is 37, creatinine 2.73 mg/dL.  In the past 18 months or average creatinine has been below 1.8 mg/dL.  Total protein was 5.9 and albumin 2.6 g/dL.  AST is slightly elevated at 48 units/L.  CRP was 2.4 mg/dL.  BNP was 497.0 pg/mL.  Ferritin was 884 and procalcitonin was 0.41 ng/mL.  Coronavirus PCR was positive.  Imaging: One-view portable chest radiograph showed mild cardiomegaly which is unchanged and normal pulmonary vascularity.  There was no active cardiopulmonary disease.  Assessment & Plan:   Principal Problem:   AKI (acute kidney injury) ([place]) Active Problems:   Hyperlipidemia   Chronic kidney  disease (CKD), stage IV (severe) (HCC)   CAD (coronary artery disease)   Chronic atrial fibrillation (HCC)   Aortic stenosis   COPD (chronic obstructive pulmonary disease) (HCC)   Chronic diastolic CHF (congestive heart failure) (HCC)   Covid Infection - asymptomatic at this time; given advanced age and co-morbidities, she had been started on remdesivir and supportive measures.  Follow inflammatory markers, recheck portable CXR on 9/7.     AKI - prenal - given IV fluids and slow improvement noted.  Follow renal function.  Slightly improved.   COPD - stable, resumed home bronchodilators.   Hypokalemia - oral replacement ordered.  Recheck in AM.   Hypoglycemia - pt not on any hypoglycemic agents.  Check morning cortisol level. Continue dextrose infusion and CBG monitoring.    Hyperlipidemia - resumed home lipid lowering agents.   AKI on Stage IV CKD - stable to slightly improved.     CAD - resumed home apixaban metoprolol and statin  Chronic atrial fibrillation - resumed home meds. Apixaban for anticoagulation.   Aortic stenosis - stable.   Chronic diastolic CHF - stable, follow clinically. Compensated at this time.  Holding diuretic.    DVT prophylaxis: apixaban Code Status: Full  Family Communication: son updated 9/6  Disposition: anticipating SNF Status is: Inpatient  Remains inpatient appropriate because:Inpatient level of care appropriate due to severity of illness  Dispo: The patient is from: Home              Anticipated d/c is to: SNF  Patient currently is not medically stable to d/c.   Difficult to place patient No  Consultants:    Procedures:    Antimicrobials:     Subjective: Pt says she wants "diet ginger ale" no CP, no SOB, cough slightly improved today  Objective: Vitals:   06/11/21 2135 06/12/21 0524 06/12/21 0821 06/12/21 1516  BP: (!) 101/51 (!) 95/59  132/71  Pulse: 67 70  63  Resp: 20 19  16   Temp: 97.9 F (36.6 C) (!) 97.5  F (36.4 C)  98.2 F (36.8 C)  TempSrc: Oral Oral  Oral  SpO2: 96% (!) 83% (!) 88% 100%  Weight:      Height:        Intake/Output Summary (Last 24 hours) at 06/12/2021 1658 Last data filed at 06/12/2021 1300 Gross per 24 hour  Intake 820.77 ml  Output --  Net 820.77 ml   Filed Weights   06/10/21 1719  Weight: 54 kg    Examination:  General exam: elderly frail but arousable female, Appears calm and comfortable  Respiratory system: fine rales heard posteriorly on right, Respiratory effort normal. Cardiovascular system: normal S1 & S2 heard. No JVD, murmurs, rubs, gallops or clicks. No pedal edema. Gastrointestinal system: Abdomen is nondistended, soft and nontender. No organomegaly or masses felt. Normal bowel sounds heard. Central nervous system: Alert and oriented. No focal neurological deficits. Extremities: Symmetric 5 x 5 power. Skin: No rashes, lesions or ulcers Psychiatry: Judgement and insight UTD. Mood & affect flat.   Data Reviewed: I have personally reviewed following labs and imaging studies  CBC: Recent Labs  Lab 06/10/21 1829 06/11/21 0539 06/12/21 0753  WBC 5.7 3.7* 3.5*  NEUTROABS 4.1  --   --   HGB 10.7* 9.5* 10.1*  HCT 32.9* 30.0* 32.0*  MCV 94.3 94.0 95.2  PLT 132* 110* 110*    Basic Metabolic Panel: Recent Labs  Lab 06/10/21 1829 06/11/21 0539 06/12/21 0753  NA 135 138 137  K 3.8 3.6 3.3*  CL 101 103 105  CO2 24 26 24   GLUCOSE 86 76 51*  BUN 37* 33* 34*  CREATININE 2.73* 2.33* 2.05*  CALCIUM 7.6* 7.8* 7.5*    GFR: Estimated Creatinine Clearance: 16.6 mL/min (A) (by C-G formula based on SCr of 2.05 mg/dL (H)).  Liver Function Tests: Recent Labs  Lab 06/10/21 1829 06/11/21 0539  AST 48* 38  ALT 14 11  ALKPHOS 71 61  BILITOT 0.8 0.5  PROT 5.9* 5.1*  ALBUMIN 2.6* 2.3*    CBG: Recent Labs  Lab 06/12/21 0926 06/12/21 0928 06/12/21 1006 06/12/21 1211  GLUCAP 39* 38* 121* 114*    Recent Results (from the past 240  hour(s))  Resp Panel by RT-PCR (Flu A&B, Covid) Nasopharyngeal Swab     Status: Abnormal   Collection Time: 06/10/21  6:29 PM   Specimen: Nasopharyngeal Swab; Nasopharyngeal(NP) swabs in vial transport medium  Result Value Ref Range Status   SARS Coronavirus 2 by RT PCR POSITIVE (A) NEGATIVE Final    Comment: CRITICAL RESULT CALLED TO, READ BACK BY AND VERIFIED WITH: Martha 06/10/2021 COLEMAN,R (NOTE) SARS-CoV-2 target nucleic acids are DETECTED.  The SARS-CoV-2 RNA is generally detectable in upper respiratory specimens during the acute phase of infection. Positive results are indicative of the presence of the identified virus, but do not rule out bacterial infection or co-infection with other pathogens not detected by the test. Clinical correlation with patient history and other diagnostic information is necessary to determine patient infection  status. The expected result is Negative.  Fact Sheet for Patients: EntrepreneurPulse.com.au  Fact Sheet for Healthcare Providers: IncredibleEmployment.be  This test is not yet approved or cleared by the [place] FDA and  has been authorized for detection and/or diagnosis of SARS-CoV-2 by FDA under an Emergency Use Authorization (EUA).  This EUA will remain in effect (meaning this t est can be used) for the duration of  the COVID-19 declaration under Section 564(b)(1) of the Act, 21 U.S.C. section 360bbb-3(b)(1), unless the authorization is terminated or revoked sooner.     Influenza A by PCR NEGATIVE NEGATIVE Final   Influenza B by PCR NEGATIVE NEGATIVE Final    Comment: (NOTE) The Xpert Xpress SARS-CoV-2/FLU/RSV plus assay is intended as an aid in the diagnosis of influenza from Nasopharyngeal swab specimens and should not be used as a sole basis for treatment. Nasal washings and aspirates are unacceptable for Xpert Xpress SARS-CoV-2/FLU/RSV testing.  Fact Sheet for  Patients: EntrepreneurPulse.com.au  Fact Sheet for Healthcare Providers: IncredibleEmployment.be  This test is not yet approved or cleared by the [place] FDA and has been authorized for detection and/or diagnosis of SARS-CoV-2 by FDA under an Emergency Use Authorization (EUA). This EUA will remain in effect (meaning this test can be used) for the duration of the COVID-19 declaration under Section 564(b)(1) of the Act, 21 U.S.C. section 360bbb-3(b)(1), unless the authorization is terminated or revoked.  Performed at [facility], [address], [place], [place] [address]      Radiology Studies: [facility] Chest [facility] 1 View  Result Date: 06/10/2021 CLINICAL DATA:  Cough, weakness, COPD, COVID positive. EXAM: PORTABLE CHEST 1 VIEW COMPARISON:  04/19/2021 FINDINGS: The lungs are symmetrically well expanded. Previously noted multifocal pulmonary infiltrate throughout the left lung has resolved. Chronic interstitial infiltrate noted at the right lung base likely related to underlying interstitial lung disease. No superimposed focal pulmonary infiltrate identified. Mild biapical pleural calcification again noted. No pneumothorax or pleural effusion. Coronary artery bypass grafting has been performed. Cardiac size is mildly enlarged, unchanged. Pulmonary vascularity is normal. No acute bone abnormality. IMPRESSION: No active disease.  Stable cardiomegaly. Electronically Signed   By: Fidela Salisbury M.D.   On: 06/10/2021 20:45    Scheduled Meds:  apixaban  2.5 mg Oral BID   vitamin C  500 mg Oral Daily   atorvastatin  40 mg Oral Daily   citalopram  20 mg Oral Daily   ezetimibe  10 mg Oral Daily   umeclidinium bromide  1 puff Inhalation Daily   And   fluticasone furoate-vilanterol  1 puff Inhalation Daily   metoprolol tartrate  25 mg Oral BID   mirtazapine  7.5 mg Oral QHS   nystatin   Topical TID   zinc sulfate  220 mg Oral Daily   Continuous Infusions:   dextrose 5 % and 0.9% NaCl 45 mL/hr at 06/12/21 1300   remdesivir 100 mg in NS 100 mL Stopped (06/12/21 0921)     LOS: 1 day   Time spent: 58 mins   Icholas Irby Wynetta Emery, MD How to contact the [facility] Attending or Consulting provider [place] or covering provider during after hours [place], for this patient?  Check the care team in [facility] and look for a) attending/consulting TRH provider listed and b) the [facility] team listed Log into [email or web address] and use [place] universal password to access. If you do not have the password, please contact the hospital operator. Locate the [facility] provider you are looking for under  [facility] and page to a number that you can be directly reached. If you still have difficulty reaching the provider, please page the [facility] (Director on Call) for the Hospitalists listed on amion for assistance.  06/12/2021, 4:58 PM

## 2021-06-13 ENCOUNTER — Inpatient Hospital Stay (HOSPITAL_COMMUNITY): Payer: Medicare Other

## 2021-06-13 LAB — BASIC METABOLIC PANEL
Anion gap: 9 (ref 5–15)
BUN: 30 mg/dL — ABNORMAL HIGH (ref 8–23)
CO2: 23 mmol/L (ref 22–32)
Calcium: 7.7 mg/dL — ABNORMAL LOW (ref 8.9–10.3)
Chloride: 107 mmol/L (ref 98–111)
Creatinine, Ser: 1.55 mg/dL — ABNORMAL HIGH (ref 0.44–1.00)
GFR, Estimated: 33 mL/min — ABNORMAL LOW (ref >60)
Glucose, Bld: 84 mg/dL (ref 70–99)
Potassium: 3.8 mmol/L (ref 3.5–5.1)
Sodium: 139 mmol/L (ref 135–145)

## 2021-06-13 LAB — D-DIMER, QUANTITATIVE: D-Dimer, Quant: 0.57 ug/mL-FEU — ABNORMAL HIGH (ref 0.00–0.50)

## 2021-06-13 LAB — GLUCOSE, CAPILLARY
Glucose-Capillary: 90 mg/dL (ref 70–99)
Glucose-Capillary: 147 mg/dL — ABNORMAL HIGH (ref 70–99)
Glucose-Capillary: 108 mg/dL — ABNORMAL HIGH (ref 70–99)
Glucose-Capillary: 158 mg/dL — ABNORMAL HIGH (ref 70–99)

## 2021-06-13 LAB — CBC
HCT: 33 % — ABNORMAL LOW (ref 36.0–46.0)
Hemoglobin: 10.4 g/dL — ABNORMAL LOW (ref 12.0–15.0)
MCH: 29.9 pg (ref 26.0–34.0)
MCHC: 31.5 g/dL (ref 30.0–36.0)
MCV: 94.8 fL (ref 80.0–100.0)
Platelets: 120 10*3/uL — ABNORMAL LOW (ref 150–400)
RBC: 3.48 MIL/uL — ABNORMAL LOW (ref 3.87–5.11)
RDW: 14.7 % (ref 11.5–15.5)
WBC: 3.6 10*3/uL — ABNORMAL LOW (ref 4.0–10.5)
nRBC: 0 % (ref 0.0–0.2)

## 2021-06-13 LAB — FERRITIN: Ferritin: 350 ng/mL — ABNORMAL HIGH (ref 11–307)

## 2021-06-13 LAB — CORTISOL-AM, BLOOD: Cortisol - AM: 19.2 ug/dL (ref 6.7–22.6)

## 2021-06-13 LAB — C-REACTIVE PROTEIN: CRP: 1.3 mg/dL — ABNORMAL HIGH (ref <1.0)

## 2021-06-13 NOTE — Progress Notes (Signed)
PROGRESS NOTE    Laura Jacobs  FGH:[id number] DOB: 04/02/1934 DOA: 06/10/2021 PCP: [name], FNP   Brief Narrative:   87 y.o. female with medical history significant of aortic stenosis, CAD, history of NSTEMI, chronic atrial fibrillation on apixaban, chronic diastolic heart failure, history of systolic CHF with recovered EF, stage III CKD, COPD, gout, hyperlipidemia, hypertension, history of unspecified pneumonia, renal cysts and diabetes syndrome who is coming to the emergency department via EMS due to generalized weakness and decreased oral intake for the past week.  Patient was admitted for poor p.o. intake in the setting of COVID infection as well as AKI that was associated.  Her lab markers are improving, but she continues to have poor oral intake that will necessitate calorie count.  Assessment & Plan:   Principal Problem:   AKI (acute kidney injury) ([place]) Active Problems:   Hyperlipidemia   Chronic kidney disease (CKD), stage IV (severe) (HCC)   CAD (coronary artery disease)   Chronic atrial fibrillation (HCC)   Aortic stenosis   COPD (chronic obstructive pulmonary disease) (HCC)   Chronic diastolic CHF (congestive heart failure) (HCC)   Covid Infection - asymptomatic at this time; given advanced age and co-morbidities, she had been started on remdesivir and supportive measures.  Follow inflammatory markers, recheck portable CXR on 9/7 with chronic changes identified.  Continues to have poor p.o. intake for which calorie count and dietary consultation ordered.   AKI on CKD stage IV- prenal - given IV fluids and slow improvement noted.  Follow renal function.  Approaching baseline.   COPD - stable, resumed home bronchodilators.    Hypoglycemia - pt not on any hypoglycemic agents.  Check morning cortisol level. Continue dextrose infusion and CBG monitoring.     Hyperlipidemia - resumed home lipid lowering agents.    CAD - resumed home apixaban metoprolol  and statin   Chronic atrial fibrillation - resumed home meds. Apixaban for anticoagulation.    Aortic stenosis - stable.    Chronic diastolic CHF - stable, follow clinically. Compensated at this time.  Holding diuretic.    DVT prophylaxis: Eliquis Code Status: Full Family Communication: Discussed with daughter on phone 9/7 Disposition Plan:  Status is: Inpatient  Remains inpatient appropriate because:Unsafe d/c plan and IV treatments appropriate due to intensity of illness or inability to take PO  Dispo: The patient is from: Home              Anticipated d/c is to: Home              Patient currently is not medically stable to d/c.   Difficult to place patient No    Consultants:  None  Procedures:  See below  Antimicrobials:  Anti-infectives (From admission, onward)    Start     Dose/Rate Route Frequency Ordered Stop   06/12/21 1000  nirmatrelvir/ritonavir EUA (renal dosing) (PAXLOVID) 2 tablet  Status:  Discontinued        2 tablet Oral 2 times daily 06/12/21 0731 06/12/21 0734   06/11/21 1800  remdesivir 100 mg in sodium chloride 0.9 % 100 mL IVPB       See Hyperspace for full Linked Orders Report.   100 mg 200 mL/hr over 30 Minutes Intravenous Daily 06/10/21 2157 06/15/21 0959   06/10/21 2226  remdesivir 100 mg in sodium chloride 0.9 % 100 mL IVPB       See Hyperspace for full Linked Orders Report.   100 mg 200 mL/hr over 30 Minutes  Intravenous  Once 06/10/21 2157 06/11/21 0200   06/10/21 2200  remdesivir 100 mg in sodium chloride 0.9 % 100 mL IVPB       See Hyperspace for full Linked Orders Report.   100 mg 200 mL/hr over 30 Minutes Intravenous  Once 06/10/21 2157 06/10/21 2359       Subjective: Patient seen and evaluated today with ongoing diminished appetite. No acute events noted overnight.  Objective: Vitals:   06/12/21 1516 06/12/21 2057 06/13/21 0421 06/13/21 0722  BP: 132/71 (!) 116/104 131/79   Pulse: 63 73 87   Resp: 16 19 18    Temp: 98.2 F  (36.8 C) 98.1 F (36.7 C) 98.2 F (36.8 C)   TempSrc: Oral Oral    SpO2: 100% 98% 99% 97%  Weight:      Height:        Intake/Output Summary (Last 24 hours) at 06/13/2021 1156 Last data filed at 06/13/2021 0933 Gross per 24 hour  Intake 1309.87 ml  Output 300 ml  Net 1009.87 ml   Filed Weights   06/10/21 1719  Weight: 54 kg    Examination:  General exam: Appears calm and comfortable  Respiratory system: Clear to auscultation. Respiratory effort normal. Cardiovascular system: S1 & S2 heard, RRR.  Gastrointestinal system: Abdomen is soft Central nervous system: Alert and awake Extremities: No edema Skin: No significant lesions noted Psychiatry: Flat affect.    Data Reviewed: I have personally reviewed following labs and imaging studies  CBC: Recent Labs  Lab 06/10/21 1829 06/11/21 0539 06/12/21 0753 06/13/21 0605  WBC 5.7 3.7* 3.5* 3.6*  NEUTROABS 4.1  --   --   --   HGB 10.7* 9.5* 10.1* 10.4*  HCT 32.9* 30.0* 32.0* 33.0*  MCV 94.3 94.0 95.2 94.8  PLT 132* 110* 110* 250*   Basic Metabolic Panel: Recent Labs  Lab 06/10/21 1829 06/11/21 0539 06/12/21 0753 06/13/21 0605  NA 135 138 137 139  K 3.8 3.6 3.3* 3.8  CL 101 103 105 107  CO2 24 26 24 23   GLUCOSE 86 76 51* 84  BUN 37* 33* 34* 30*  CREATININE 2.73* 2.33* 2.05* 1.55*  CALCIUM 7.6* 7.8* 7.5* 7.7*   GFR: Estimated Creatinine Clearance: 22 mL/min (A) (by C-G formula based on SCr of 1.55 mg/dL (H)). Liver Function Tests: Recent Labs  Lab 06/10/21 1829 06/11/21 0539  AST 48* 38  ALT 14 11  ALKPHOS 71 61  BILITOT 0.8 0.5  PROT 5.9* 5.1*  ALBUMIN 2.6* 2.3*   No results for input(s): LIPASE, AMYLASE in the last 168 hours. No results for input(s): AMMONIA in the last 168 hours. Coagulation Profile: No results for input(s): INR, PROTIME in the last 168 hours. Cardiac Enzymes: No results for input(s): CKTOTAL, CKMB, CKMBINDEX, TROPONINI in the last 168 hours. BNP (last 3 results) Recent Labs     01/12/21 1356  PROBNP 8,741*   HbA1C: No results for input(s): HGBA1C in the last 72 hours. CBG: Recent Labs  Lab 06/12/21 1211 06/12/21 1739 06/12/21 2059 06/13/21 0817 06/13/21 1127  GLUCAP 114* 71 84 90 147*   Lipid Profile: No results for input(s): CHOL, HDL, LDLCALC, TRIG, CHOLHDL, LDLDIRECT in the last 72 hours. Thyroid Function Tests: No results for input(s): TSH, T4TOTAL, FREET4, T3FREE, THYROIDAB in the last 72 hours. Anemia Panel: Recent Labs    06/10/21 1829 06/13/21 0605  FERRITIN 884* 350*   Sepsis Labs: Recent Labs  Lab 06/10/21 1829  PROCALCITON 0.41    Recent Results (from  the past 240 hour(s))  Resp Panel by RT-PCR (Flu A&B, Covid) Nasopharyngeal Swab     Status: Abnormal   Collection Time: 06/10/21  6:29 PM   Specimen: Nasopharyngeal Swab; Nasopharyngeal(NP) swabs in vial transport medium  Result Value Ref Range Status   SARS Coronavirus 2 by RT PCR POSITIVE (A) NEGATIVE Final    Comment: CRITICAL RESULT CALLED TO, READ BACK BY AND VERIFIED WITH: [place] 06/10/2021 COLEMAN,R (NOTE) SARS-CoV-2 target nucleic acids are DETECTED.  The SARS-CoV-2 RNA is generally detectable in upper respiratory specimens during the acute phase of infection. Positive results are indicative of the presence of the identified virus, but do not rule out bacterial infection or co-infection with other pathogens not detected by the test. Clinical correlation with patient history and other diagnostic information is necessary to determine patient infection status. The expected result is Negative.  Fact Sheet for Patients: EntrepreneurPulse.com.au  Fact Sheet for Healthcare Providers: IncredibleEmployment.be  This test is not yet approved or cleared by the [place] FDA and  has been authorized for detection and/or diagnosis of SARS-CoV-2 by FDA under an Emergency Use Authorization (EUA).  This EUA will remain in effect  (meaning this t est can be used) for the duration of  the COVID-19 declaration under Section 564(b)(1) of the Act, 21 U.S.C. section 360bbb-3(b)(1), unless the authorization is terminated or revoked sooner.     Influenza A by PCR NEGATIVE NEGATIVE Final   Influenza B by PCR NEGATIVE NEGATIVE Final    Comment: (NOTE) The Xpert Xpress SARS-CoV-2/FLU/RSV plus assay is intended as an aid in the diagnosis of influenza from Nasopharyngeal swab specimens and should not be used as a sole basis for treatment. Nasal washings and aspirates are unacceptable for Xpert Xpress SARS-CoV-2/FLU/RSV testing.  Fact Sheet for Patients: EntrepreneurPulse.com.au  Fact Sheet for Healthcare Providers: IncredibleEmployment.be  This test is not yet approved or cleared by the [place] FDA and has been authorized for detection and/or diagnosis of SARS-CoV-2 by FDA under an Emergency Use Authorization (EUA). This EUA will remain in effect (meaning this test can be used) for the duration of the COVID-19 declaration under Section 564(b)(1) of the Act, 21 U.S.C. section 360bbb-3(b)(1), unless the authorization is terminated or revoked.  Performed at [facility], [address] [place], [place] [address]          Radiology Studies: DG CHEST PORT 1 VIEW  Result Date: 06/13/2021 CLINICAL DATA:  COVID-19 positive EXAM: PORTABLE CHEST 1 VIEW COMPARISON:  06/10/2021 FINDINGS: Post CABG changes. Stable cardiomegaly. Atherosclerotic calcification of the aortic knob. Chronically coarsened interstitial markings most evident within the right lung base. No focal airspace consolidation, pleural effusion, or pneumothorax. IMPRESSION: Chronic lung changes without acute cardiopulmonary findings. Electronically Signed   By: Davina Poke D.O.   On: 06/13/2021 08:44        Scheduled Meds:  apixaban  2.5 mg Oral BID   vitamin C  500 mg Oral Daily   atorvastatin  40 mg Oral  Daily   citalopram  20 mg Oral Daily   ezetimibe  10 mg Oral Daily   umeclidinium bromide  1 puff Inhalation Daily   And   fluticasone furoate-vilanterol  1 puff Inhalation Daily   metoprolol tartrate  25 mg Oral BID   mirtazapine  7.5 mg Oral QHS   nystatin   Topical TID   zinc sulfate  220 mg Oral Daily   Continuous Infusions:  dextrose 5 % and 0.9% NaCl 50 mL/hr at 06/13/21  0500   remdesivir 100 mg in NS 100 mL 100 mg (06/13/21 0933)     LOS: 2 days    Time spent: 35 minutes    Khyrin Trevathan D Manuella Ghazi, DO [facility]  If 7PM-7AM, please contact night-coverage [email or web address] 06/13/2021, 11:56 AM

## 2021-06-14 LAB — MAGNESIUM: Magnesium: 1.5 mg/dL — ABNORMAL LOW (ref 1.7–2.4)

## 2021-06-14 LAB — BASIC METABOLIC PANEL
Anion gap: 7 (ref 5–15)
BUN: 23 mg/dL (ref 8–23)
CO2: 23 mmol/L (ref 22–32)
Calcium: 7.6 mg/dL — ABNORMAL LOW (ref 8.9–10.3)
Chloride: 110 mmol/L (ref 98–111)
Creatinine, Ser: 1.41 mg/dL — ABNORMAL HIGH (ref 0.44–1.00)
GFR, Estimated: 37 mL/min — ABNORMAL LOW (ref >60)
Glucose, Bld: 89 mg/dL (ref 70–99)
Potassium: 3.8 mmol/L (ref 3.5–5.1)
Sodium: 140 mmol/L (ref 135–145)

## 2021-06-14 LAB — CBC
HCT: 32.1 % — ABNORMAL LOW (ref 36.0–46.0)
Hemoglobin: 10 g/dL — ABNORMAL LOW (ref 12.0–15.0)
MCH: 29.7 pg (ref 26.0–34.0)
MCHC: 31.2 g/dL (ref 30.0–36.0)
MCV: 95.3 fL (ref 80.0–100.0)
Platelets: 122 10*3/uL — ABNORMAL LOW (ref 150–400)
RBC: 3.37 MIL/uL — ABNORMAL LOW (ref 3.87–5.11)
RDW: 14.9 % (ref 11.5–15.5)
WBC: 4.1 10*3/uL (ref 4.0–10.5)
nRBC: 0 % (ref 0.0–0.2)

## 2021-06-14 LAB — GLUCOSE, CAPILLARY
Glucose-Capillary: 94 mg/dL (ref 70–99)
Glucose-Capillary: 117 mg/dL — ABNORMAL HIGH (ref 70–99)
Glucose-Capillary: 151 mg/dL — ABNORMAL HIGH (ref 70–99)
Glucose-Capillary: 140 mg/dL — ABNORMAL HIGH (ref 70–99)

## 2021-06-14 MED ORDER — ADULT MULTIVITAMIN W/MINERALS CH
1.0000 | ORAL_TABLET | Freq: Every day | ORAL | Status: DC
  Administered 2021-06-14 – 2021-06-16 (×3): 1 via ORAL
  Filled 2021-06-14 (×3): qty 1

## 2021-06-14 MED ORDER — MAGNESIUM SULFATE 2 GM/50ML IV SOLN
2.0000 g | Freq: Once | INTRAVENOUS | Status: AC
  Administered 2021-06-14: 2 g via INTRAVENOUS
  Filled 2021-06-14: qty 50

## 2021-06-14 MED ORDER — ENSURE ENLIVE PO LIQD
237.0000 mL | Freq: Three times a day (TID) | ORAL | Status: DC
  Administered 2021-06-14 – 2021-06-16 (×6): 237 mL via ORAL

## 2021-06-14 NOTE — Plan of Care (Signed)

## 2021-06-14 NOTE — Progress Notes (Signed)
PROGRESS NOTE    Laura Jacobs  LZJ:[id number] DOB: [birth year] DOA: 06/10/2021 PCP: [name], FNP   Brief Narrative:   87 y.o. female with medical history significant of aortic stenosis, CAD, history of NSTEMI, chronic atrial fibrillation on apixaban, chronic diastolic heart failure, history of systolic CHF with recovered EF, stage III CKD, COPD, gout, hyperlipidemia, hypertension, history of unspecified pneumonia, renal cysts and diabetes syndrome who is coming to the emergency department via EMS due to generalized weakness and decreased oral intake for the past week.  Patient was admitted for poor p.o. intake in the setting of COVID infection as well as AKI that was associated.  Her lab markers are improving, but she continues to have poor oral intake that will necessitate calorie count.  Assessment & Plan:   Principal Problem:   AKI (acute kidney injury) ([place]) Active Problems:   Hyperlipidemia   Chronic kidney disease (CKD), stage IV (severe) (HCC)   CAD (coronary artery disease)   Chronic atrial fibrillation (HCC)   Aortic stenosis   COPD (chronic obstructive pulmonary disease) (HCC)   Chronic diastolic CHF (congestive heart failure) (HCC)   Covid Infection - asymptomatic at this time; given advanced age and co-morbidities, she had been started on remdesivir and supportive measures.  Follow inflammatory markers, recheck portable CXR on 9/7 with chronic changes identified.  Continues to have poor p.o. intake for which calorie count and dietary consultation ordered.   AKI on CKD stage IV- prenal - given IV fluids and slow improvement noted.  Follow renal function.  Approaching baseline.   COPD - stable, resumed home bronchodilators.    Hypoglycemia - pt not on any hypoglycemic agents.  Check morning cortisol level. Continue dextrose infusion and CBG monitoring.     Hyperlipidemia - resumed home lipid lowering agents.    CAD - resumed home apixaban metoprolol  and statin   Chronic atrial fibrillation - resumed home meds. Apixaban for anticoagulation.    Aortic stenosis - stable.    Chronic diastolic CHF - stable, follow clinically. Compensated at this time.  Holding diuretic.      DVT prophylaxis: Eliquis Code Status: Full Family Communication: Discussed with daughter on phone 9/7 Disposition Plan:  Status is: Inpatient   Remains inpatient appropriate because:Unsafe d/c plan and IV treatments appropriate due to intensity of illness or inability to take PO   Dispo: The patient is from: Home              Anticipated d/c is to: Home              Patient currently is not medically stable to d/c.              Difficult to place patient No       Consultants:  None   Procedures:  See below  Antimicrobials:  Anti-infectives (From admission, onward)    Start     Dose/Rate Route Frequency Ordered Stop   06/12/21 1000  nirmatrelvir/ritonavir EUA (renal dosing) (PAXLOVID) 2 tablet  Status:  Discontinued        2 tablet Oral 2 times daily 06/12/21 0731 06/12/21 0734   06/11/21 1800  remdesivir 100 mg in sodium chloride 0.9 % 100 mL IVPB       See Hyperspace for full Linked Orders Report.   100 mg 200 mL/hr over 30 Minutes Intravenous Daily 06/10/21 2157 06/15/21 0959   06/10/21 2226  remdesivir 100 mg in sodium chloride 0.9 % 100 mL IVPB  See Hyperspace for full Linked Orders Report.   100 mg 200 mL/hr over 30 Minutes Intravenous  Once 06/10/21 2157 06/11/21 0200   06/10/21 2200  remdesivir 100 mg in sodium chloride 0.9 % 100 mL IVPB       See Hyperspace for full Linked Orders Report.   100 mg 200 mL/hr over 30 Minutes Intravenous  Once 06/10/21 2157 06/10/21 2359      Subjective: Patient seen and evaluated today with ongoing poor oral intake. She appears confused.  Objective: Vitals:   06/13/21 1426 06/13/21 2036 06/14/21 0514 06/14/21 0803  BP: 123/80 122/62 (!) 144/92   Pulse: 70 66 78   Resp: 16 18 18    Temp: 99.2 F  (37.3 C) 98.6 F (37 C) 97.9 F (36.6 C)   TempSrc: Oral  Oral   SpO2: 100% 91% 100% 100%  Weight:      Height:        Intake/Output Summary (Last 24 hours) at 06/14/2021 0946 Last data filed at 06/14/2021 0300 Gross per 24 hour  Intake 1449.24 ml  Output --  Net 1449.24 ml   Filed Weights   06/10/21 1719  Weight: 54 kg    Examination:  General exam: Appears calm and comfortable  Respiratory system: Clear to auscultation. Respiratory effort normal. Cardiovascular system: S1 & S2 heard, RRR.  Gastrointestinal system: Abdomen is soft Central nervous system: Alert and awake Extremities: No edema Skin: No significant lesions noted Psychiatry: Flat affect.    Data Reviewed: I have personally reviewed following labs and imaging studies  CBC: Recent Labs  Lab 06/10/21 1829 06/11/21 0539 06/12/21 0753 06/13/21 0605 06/14/21 0654  WBC 5.7 3.7* 3.5* 3.6* 4.1  NEUTROABS 4.1  --   --   --   --   HGB 10.7* 9.5* 10.1* 10.4* 10.0*  HCT 32.9* 30.0* 32.0* 33.0* 32.1*  MCV 94.3 94.0 95.2 94.8 95.3  PLT 132* 110* 110* 120* 696*   Basic Metabolic Panel: Recent Labs  Lab 06/10/21 1829 06/11/21 0539 06/12/21 0753 06/13/21 0605 06/14/21 0654  NA 135 138 137 139 140  K 3.8 3.6 3.3* 3.8 3.8  CL 101 103 105 107 110  CO2 24 26 24 23 23   GLUCOSE 86 76 51* 84 89  BUN 37* 33* 34* 30* 23  CREATININE 2.73* 2.33* 2.05* 1.55* 1.41*  CALCIUM 7.6* 7.8* 7.5* 7.7* 7.6*  MG  --   --   --   --  1.5*   GFR: Estimated Creatinine Clearance: 24.1 mL/min (A) (by C-G formula based on SCr of 1.41 mg/dL (H)). Liver Function Tests: Recent Labs  Lab 06/10/21 1829 06/11/21 0539  AST 48* 38  ALT 14 11  ALKPHOS 71 61  BILITOT 0.8 0.5  PROT 5.9* 5.1*  ALBUMIN 2.6* 2.3*   No results for input(s): LIPASE, AMYLASE in the last 168 hours. No results for input(s): AMMONIA in the last 168 hours. Coagulation Profile: No results for input(s): INR, PROTIME in the last 168 hours. Cardiac  Enzymes: No results for input(s): CKTOTAL, CKMB, CKMBINDEX, TROPONINI in the last 168 hours. BNP (last 3 results) Recent Labs    01/12/21 1356  PROBNP 8,741*   HbA1C: No results for input(s): HGBA1C in the last 72 hours. CBG: Recent Labs  Lab 06/13/21 0817 06/13/21 1127 06/13/21 1619 06/13/21 2037 06/14/21 0721  GLUCAP 90 147* 108* 158* 94   Lipid Profile: No results for input(s): CHOL, HDL, LDLCALC, TRIG, CHOLHDL, LDLDIRECT in the last 72 hours. Thyroid Function Tests:  No results for input(s): TSH, T4TOTAL, FREET4, T3FREE, THYROIDAB in the last 72 hours. Anemia Panel: Recent Labs    06/13/21 0605  FERRITIN 350*   Sepsis Labs: Recent Labs  Lab 06/10/21 1829  PROCALCITON 0.41    Recent Results (from the past 240 hour(s))  Resp Panel by RT-PCR (Flu A&B, Covid) Nasopharyngeal Swab     Status: Abnormal   Collection Time: 06/10/21  6:29 PM   Specimen: Nasopharyngeal Swab; Nasopharyngeal(NP) swabs in vial transport medium  Result Value Ref Range Status   SARS Coronavirus 2 by RT PCR POSITIVE (A) NEGATIVE Final    Comment: CRITICAL RESULT CALLED TO, READ BACK BY AND VERIFIED WITH: [place] 06/10/2021 COLEMAN,R (NOTE) SARS-CoV-2 target nucleic acids are DETECTED.  The SARS-CoV-2 RNA is generally detectable in upper respiratory specimens during the acute phase of infection. Positive results are indicative of the presence of the identified virus, but do not rule out bacterial infection or co-infection with other pathogens not detected by the test. Clinical correlation with patient history and other diagnostic information is necessary to determine patient infection status. The expected result is Negative.  Fact Sheet for Patients: EntrepreneurPulse.com.au  Fact Sheet for Healthcare Providers: IncredibleEmployment.be  This test is not yet approved or cleared by the [place] FDA and  has been authorized for detection  and/or diagnosis of SARS-CoV-2 by FDA under an Emergency Use Authorization (EUA).  This EUA will remain in effect (meaning this t est can be used) for the duration of  the COVID-19 declaration under Section 564(b)(1) of the Act, 21 U.S.C. section 360bbb-3(b)(1), unless the authorization is terminated or revoked sooner.     Influenza A by PCR NEGATIVE NEGATIVE Final   Influenza B by PCR NEGATIVE NEGATIVE Final    Comment: (NOTE) The Xpert Xpress SARS-CoV-2/FLU/RSV plus assay is intended as an aid in the diagnosis of influenza from Nasopharyngeal swab specimens and should not be used as a sole basis for treatment. Nasal washings and aspirates are unacceptable for Xpert Xpress SARS-CoV-2/FLU/RSV testing.  Fact Sheet for Patients: EntrepreneurPulse.com.au  Fact Sheet for Healthcare Providers: IncredibleEmployment.be  This test is not yet approved or cleared by the [place] FDA and has been authorized for detection and/or diagnosis of SARS-CoV-2 by FDA under an Emergency Use Authorization (EUA). This EUA will remain in effect (meaning this test can be used) for the duration of the COVID-19 declaration under Section 564(b)(1) of the Act, 21 U.S.C. section 360bbb-3(b)(1), unless the authorization is terminated or revoked.  Performed at [facility], [address] [place], [place] [address]          Radiology Studies: DG CHEST PORT 1 VIEW  Result Date: 06/13/2021 CLINICAL DATA:  COVID-19 positive EXAM: PORTABLE CHEST 1 VIEW COMPARISON:  06/10/2021 FINDINGS: Post CABG changes. Stable cardiomegaly. Atherosclerotic calcification of the aortic knob. Chronically coarsened interstitial markings most evident within the right lung base. No focal airspace consolidation, pleural effusion, or pneumothorax. IMPRESSION: Chronic lung changes without acute cardiopulmonary findings. Electronically Signed   By: Davina Poke D.O.   On: 06/13/2021 08:44         Scheduled Meds:  apixaban  2.5 mg Oral BID   vitamin C  500 mg Oral Daily   atorvastatin  40 mg Oral Daily   citalopram  20 mg Oral Daily   ezetimibe  10 mg Oral Daily   feeding supplement  237 mL Oral TID BM   umeclidinium bromide  1 puff Inhalation Daily   And  fluticasone furoate-vilanterol  1 puff Inhalation Daily   metoprolol tartrate  25 mg Oral BID   mirtazapine  7.5 mg Oral QHS   multivitamin with minerals  1 tablet Oral Daily   nystatin   Topical TID   zinc sulfate  220 mg Oral Daily   Continuous Infusions:  dextrose 5 % and 0.9% NaCl 50 mL/hr at 06/13/21 1800   magnesium sulfate bolus IVPB     remdesivir 100 mg in NS 100 mL Stopped (06/13/21 1003)     LOS: 3 days    Time spent: 35 minutes    Hien Perreira Darleen Crocker, DO [facility]  If 7PM-7AM, please contact night-coverage [email or web address] 06/14/2021, 9:46 AM

## 2021-06-14 NOTE — Progress Notes (Signed)
Physical Therapy Treatment Patient Details Name: Laura Jacobs MRN: [id number] DOB: 1934/02/17 Today's Date: 06/14/2021    History of Present Illness Laura Jacobs is a 87 y.o. female with medical history significant of aortic stenosis, CAD, history of NSTEMI, chronic atrial fibrillation on apixaban, chronic diastolic heart failure, history of systolic CHF with recovered EF, stage III CKD, COPD, gout, hyperlipidemia, hypertension, history of unspecified pneumonia, renal cysts and diabetes syndrome who is coming to the emergency department via EMS due to generalized weakness and decreased oral intake for the past week.  She had a COVID exposure about a week ago.  No fever and only mild cough according to what the family told Dr. Karle Starch.  She denied headache, chest back or abdominal pain this time.  No nausea or vomiting.  Diarrhea or constipation.  Denied dysuria, frequency or hematuria.    PT Comments    Patient alert and cooperative with therapy with verbal/tactile cueing.  Patient demonstrates slow labored movement for sitting up at bedside, once seated able to complete BLE exercises with repeated verbal cueing and demonstration and limited to a few labored unsteady side steps at bedside before having to sit due weakness and poor standing balance.  Patient tolerated sitting up in chair after therapy - nursing staff notified.  Patient will benefit from continued physical therapy in hospital and recommended venue below to increase strength, balance, endurance for safe ADLs and gait.       Follow Up Recommendations  SNF     Equipment Recommendations  None recommended by PT    Recommendations for Other Services       Precautions / Restrictions Precautions Precautions: Fall Restrictions Weight Bearing Restrictions: No    Mobility  Bed Mobility Overal bed mobility: Needs Assistance Bed Mobility: Supine to Sit     Supine to sit: Mod assist;Min assist     General bed  mobility comments: increased time, labored movement    Transfers Overall transfer level: Needs assistance Equipment used: Rolling walker (2 wheeled) Transfers: Sit to/from [facility] Sit to Stand: Min assist;Mod assist Stand pivot transfers: Mod assist       General transfer comment: increased time, labored movement, diffiuclty advancing BLE due to weakness  Ambulation/Gait Ambulation/Gait assistance: Mod assist;Max assist Gait Distance (Feet): 5 Feet Assistive device: Rolling walker (2 wheeled) Gait Pattern/deviations: Decreased step length - left;Decreased stance time - right;Decreased stride length;Shuffle Gait velocity: decreased   General Gait Details: slow labored side steps requiring verbal/tactile cueing to take steps with mostly shuffling of feet, limited secondary to fatigue and poor standing balance   Stairs             Wheelchair Mobility    Modified Rankin (Stroke Patients Only)       Balance Overall balance assessment: Needs assistance Sitting-balance support: Feet supported;No upper extremity supported Sitting balance-Leahy Scale: Fair Sitting balance - Comments: fair/good seated at EOB   Standing balance support: During functional activity;Bilateral upper extremity supported Standing balance-Leahy Scale: Poor Standing balance comment: using RW                            Cognition Arousal/Alertness: Awake/alert Behavior During Therapy: WFL for tasks assessed/performed Overall Cognitive Status: Within Functional Limits for tasks assessed  Exercises General Exercises - Lower Extremity Long Arc Quad: Seated;AROM;Strengthening;Both;10 reps Hip Flexion/Marching: Seated;AROM;Strengthening;Both;10 reps Heel Raises: Seated;AROM;Strengthening;Both;10 reps    General Comments        Pertinent Vitals/Pain Pain Assessment: No/denies pain    Home Living                       Prior Function            PT Goals (current goals can now be found in the care plan section) Acute Rehab PT Goals Patient Stated Goal: Return home PT Goal Formulation: With patient Time For Goal Achievement: 06/25/21 Potential to Achieve Goals: Good Progress towards PT goals: Progressing toward goals    Frequency    Min 3X/week      PT Plan Current plan remains appropriate    Co-evaluation              AM-PAC PT "6 Clicks" Mobility   Outcome Measure  Help needed turning from your back to your side while in a flat bed without using bedrails?: A Little Help needed moving from lying on your back to sitting on the side of a flat bed without using bedrails?: A Little Help needed moving to and from a bed to a chair (including a wheelchair)?: A Lot Help needed standing up from a chair using your arms (e.g., wheelchair or bedside chair)?: A Lot Help needed to walk in hospital room?: A Lot Help needed climbing 3-5 steps with a railing? : Total 6 Click Score: 13    End of Session   Activity Tolerance: Patient tolerated treatment well;Patient limited by fatigue Patient left: in chair;with call bell/phone within reach Nurse Communication: Mobility status PT Visit Diagnosis: Unsteadiness on feet (R26.81);Other abnormalities of gait and mobility (R26.89);Muscle weakness (generalized) (M62.81)     Time: 3013-1438 PT Time Calculation (min) (ACUTE ONLY): 23 min  Charges:  $Therapeutic Exercise: 8-22 mins $Therapeutic Activity: 8-22 mins                     4:12 PM, 06/14/21 Lonell Grandchild, MPT Physical Therapist with [facility] [phone] office [phone] mobile phone

## 2021-06-14 NOTE — Progress Notes (Addendum)
Initial Nutrition Assessment  DOCUMENTATION CODES:      INTERVENTION:  48-hr Calorie Count to start at lunch today.   Ensure Enlive po BID, each supplement provides 350 kcal and 20 grams of protein  Provide assistance with feeding all meals    NUTRITION DIAGNOSIS:   Increased nutrient needs related to acute illness (COVID infection) as evidenced by energy intake < or equal to 50% for > or equal to 5 days.   GOAL:  Patient will meet greater than or equal to 90% of their needs   MONITOR:  PO intake, Supplement acceptance, Labs, Weight trends  REASON FOR ASSESSMENT:   Consult Calorie Count, Assessment of nutrition requirement/status  ASSESSMENT: Patient is a 87 yo female with hx of COPD, CHF, CKD-4, CAD, HLD, and HTN. Presents with AKI, COVID infection. Poor intake for a week prior to admission.  Calorie Count started this morning. Talked with nursing assistant who was feeding patient during my visit. Patient did not eat well yesterday. PO: 0-25% of 8 meals.Patient has an appetite medication on board. She likes Ensure Plus chocolate and the [place] Ice (mango).   Weight history reviewed. 05/12/20- 51.3 kg and current 54 kg.  In April and May of this year her weight peaked at 58-59 kg.  Medications reviewed and include: remeron, lipitor, zinc   IVF- D5% / NS @ 50 ml/hr  Labs: BMP Latest Ref Rng & Units 06/14/2021 06/13/2021 06/12/2021  Glucose 70 - 99 mg/dL 89 84 51(L)  BUN 8 - 23 mg/dL 23 30(H) 34(H)  Creatinine 0.44 - 1.00 mg/dL 1.41(H) 1.55(H) 2.05(H)  BUN/Creat Ratio 12 - 28 - - -  Sodium 135 - 145 mmol/L 140 139 137  Potassium 3.5 - 5.1 mmol/L 3.8 3.8 3.3(L)  Chloride 98 - 111 mmol/L 110 107 105  CO2 22 - 32 mmol/L 23 23 24   Calcium 8.9 - 10.3 mg/dL 7.6(L) 7.7(L) 7.5(L)      NUTRITION - FOCUSED PHYSICAL EXAM: Unable to complete Nutrition-Focused physical exam at this time.  Patient is being fed. Likely malnutrition in the setting of COVID infection and poor intake.  Will complete NFPE on return to clarify.  Diet Order:   Diet Order             Diet Heart Room service appropriate? Yes; Fluid consistency: Thin  Diet effective now                   EDUCATION NEEDS:  Not appropriate for education at this time  Skin:  Skin Assessment: Reviewed RN Assessment  Last BM:  9/6  Height:   Ht Readings from Last 1 Encounters:  06/10/21 5\' 3"  (1.6 m)    Weight:   Wt Readings from Last 1 Encounters:  06/10/21 54 kg    Ideal Body Weight:   52 kg  BMI:  Body mass index is 21.09 kg/m.  Estimated Nutritional Needs:   Kcal:  1620-1780  Protein:  70-75 gr  Fluid:  1300-1400 ml   Colman Cater MS,RD,CSG,LDN Contact: Shea Evans

## 2021-06-15 DIAGNOSIS — R627 Adult failure to thrive: Secondary | ICD-10-CM

## 2021-06-15 DIAGNOSIS — R630 Anorexia: Secondary | ICD-10-CM

## 2021-06-15 DIAGNOSIS — Z515 Encounter for palliative care: Secondary | ICD-10-CM

## 2021-06-15 DIAGNOSIS — Z7189 Other specified counseling: Secondary | ICD-10-CM

## 2021-06-15 DIAGNOSIS — Z66 Do not resuscitate: Secondary | ICD-10-CM

## 2021-06-15 LAB — BASIC METABOLIC PANEL
Anion gap: 4 — ABNORMAL LOW (ref 5–15)
BUN: 23 mg/dL (ref 8–23)
CO2: 22 mmol/L (ref 22–32)
Calcium: 7.3 mg/dL — ABNORMAL LOW (ref 8.9–10.3)
Chloride: 114 mmol/L — ABNORMAL HIGH (ref 98–111)
Creatinine, Ser: 1.24 mg/dL — ABNORMAL HIGH (ref 0.44–1.00)
GFR, Estimated: 43 mL/min — ABNORMAL LOW (ref >60)
Glucose, Bld: 89 mg/dL (ref 70–99)
Potassium: 3.6 mmol/L (ref 3.5–5.1)
Sodium: 140 mmol/L (ref 135–145)

## 2021-06-15 LAB — GLUCOSE, CAPILLARY
Glucose-Capillary: 100 mg/dL — ABNORMAL HIGH (ref 70–99)
Glucose-Capillary: 81 mg/dL (ref 70–99)
Glucose-Capillary: 82 mg/dL (ref 70–99)
Glucose-Capillary: 90 mg/dL (ref 70–99)
Glucose-Capillary: 108 mg/dL — ABNORMAL HIGH (ref 70–99)

## 2021-06-15 LAB — MAGNESIUM: Magnesium: 2.2 mg/dL (ref 1.7–2.4)

## 2021-06-15 MED ORDER — MIRTAZAPINE 15 MG PO TABS: 7.5000 mg | ORAL_TABLET | Freq: Every day | ORAL | Status: DC

## 2021-06-15 NOTE — Progress Notes (Signed)
PROGRESS NOTE    Laura Jacobs  KKX:[id number] DOB: 07/14/1934 DOA: 06/10/2021 PCP: [name], FNP   Brief Narrative:   87 y.o. female with medical history significant of aortic stenosis, CAD, history of NSTEMI, chronic atrial fibrillation on apixaban, chronic diastolic heart failure, history of systolic CHF with recovered EF, stage III CKD, COPD, gout, hyperlipidemia, hypertension, history of unspecified pneumonia, renal cysts and diabetes syndrome who is coming to the emergency department via EMS due to generalized weakness and decreased oral intake for the past week.  Patient was admitted for poor p.o. intake in the setting of COVID infection as well as AKI that was associated.  Her lab markers are improving, but she continues to have poor oral intake that will necessitate calorie count. Palliative consultation ordered for concern for failure to thrive.  Assessment & Plan:   Principal Problem:   AKI (acute kidney injury) ([place]) Active Problems:   Hyperlipidemia   Chronic kidney disease (CKD), stage IV (severe) (HCC)   CAD (coronary artery disease)   Chronic atrial fibrillation (HCC)   Aortic stenosis   COPD (chronic obstructive pulmonary disease) (HCC)   Chronic diastolic CHF (congestive heart failure) (HCC)   Covid Infection - asymptomatic at this time; given advanced age and co-morbidities, she had been started on remdesivir and supportive measures.  Follow inflammatory markers, recheck portable CXR on 9/7 with chronic changes identified.  Continues to have poor p.o. intake for which calorie count and dietary consultation ordered.  Palliative consulted 9/9 due to concern for failure to thrive.  Remeron ordered for bedtime starting 9/9 to see if this will help improve appetite.   AKI on CKD stage IV- prenal - given IV fluids and slow improvement noted.  Follow renal function.  Appears to have resolved.  Likely dementia-continues to have ongoing confusion and has had  memory impairment previously.   COPD - stable, resumed home bronchodilators.    Hypoglycemia - pt not on any hypoglycemic agents.  Appears to be related to poor oral intake. Continue dextrose infusion and CBG monitoring.     Hyperlipidemia - resumed home lipid lowering agents.    CAD - resumed home apixaban metoprolol and statin   Chronic atrial fibrillation - resumed home meds. Apixaban for anticoagulation.    Aortic stenosis - stable.    Chronic diastolic CHF - stable, follow clinically. Compensated at this time.  Holding diuretic.      DVT prophylaxis: Eliquis Code Status: Full Family Communication: Discussed with daughter on phone 9/9 Disposition Plan:  Status is: Inpatient   Remains inpatient appropriate because:Unsafe d/c plan and IV treatments appropriate due to intensity of illness or inability to take PO   Dispo: The patient is from: Home              Anticipated d/c is to: Home              Patient currently is not medically stable to d/c.              Difficult to place patient No       Consultants:  Palliative care   Procedures:  See below  Antimicrobials:  Anti-infectives (From admission, onward)    Start     Dose/Rate Route Frequency Ordered Stop   06/12/21 1000  nirmatrelvir/ritonavir EUA (renal dosing) (PAXLOVID) 2 tablet  Status:  Discontinued        2 tablet Oral 2 times daily 06/12/21 0731 06/12/21 0734   06/11/21 1800  remdesivir 100 mg  in sodium chloride 0.9 % 100 mL IVPB       See Hyperspace for full Linked Orders Report.   100 mg 200 mL/hr over 30 Minutes Intravenous Daily 06/10/21 2157 06/14/21 1108   06/10/21 2226  remdesivir 100 mg in sodium chloride 0.9 % 100 mL IVPB       See Hyperspace for full Linked Orders Report.   100 mg 200 mL/hr over 30 Minutes Intravenous  Once 06/10/21 2157 06/11/21 0200   06/10/21 2200  remdesivir 100 mg in sodium chloride 0.9 % 100 mL IVPB       See Hyperspace for full Linked Orders Report.   100 mg 200  mL/hr over 30 Minutes Intravenous  Once 06/10/21 2157 06/10/21 2359       Subjective: Patient seen and evaluated today with ongoing anorexia and poor intake. No acute concerns or events noted overnight.  Objective: Vitals:   06/14/21 1341 06/14/21 2148 06/15/21 0450 06/15/21 0752  BP: 118/75 112/69 115/69   Pulse: 64 84 71   Resp: 16 18 18    Temp: 98.4 F (36.9 C) 98.2 F (36.8 C) 97.6 F (36.4 C)   TempSrc:  Oral Oral   SpO2: 98% 99% 100% 99%  Weight:      Height:        Intake/Output Summary (Last 24 hours) at 06/15/2021 0842 Last data filed at 06/15/2021 0500 Gross per 24 hour  Intake 1477.99 ml  Output --  Net 1477.99 ml   Filed Weights   06/10/21 1719  Weight: 54 kg    Examination:  General exam: Appears calm and comfortable, confused Respiratory system: Clear to auscultation. Respiratory effort normal. Cardiovascular system: S1 & S2 heard, RRR.  Gastrointestinal system: Abdomen is soft Central nervous system: Alert and awake Extremities: No edema Skin: No significant lesions noted Psychiatry: Flat affect.    Data Reviewed: I have personally reviewed following labs and imaging studies  CBC: Recent Labs  Lab 06/10/21 1829 06/11/21 0539 06/12/21 0753 06/13/21 0605 06/14/21 0654  WBC 5.7 3.7* 3.5* 3.6* 4.1  NEUTROABS 4.1  --   --   --   --   HGB 10.7* 9.5* 10.1* 10.4* 10.0*  HCT 32.9* 30.0* 32.0* 33.0* 32.1*  MCV 94.3 94.0 95.2 94.8 95.3  PLT 132* 110* 110* 120* 203*   Basic Metabolic Panel: Recent Labs  Lab 06/11/21 0539 06/12/21 0753 06/13/21 0605 06/14/21 0654 06/15/21 0601  NA 138 137 139 140 140  K 3.6 3.3* 3.8 3.8 3.6  CL 103 105 107 110 114*  CO2 26 24 23 23 22   GLUCOSE 76 51* 84 89 89  BUN 33* 34* 30* 23 23  CREATININE 2.33* 2.05* 1.55* 1.41* 1.24*  CALCIUM 7.8* 7.5* 7.7* 7.6* 7.3*  MG  --   --   --  1.5* 2.2   GFR: Estimated Creatinine Clearance: 27.4 mL/min (A) (by C-G formula based on SCr of 1.24 mg/dL (H)). Liver Function  Tests: Recent Labs  Lab 06/10/21 1829 06/11/21 0539  AST 48* 38  ALT 14 11  ALKPHOS 71 61  BILITOT 0.8 0.5  PROT 5.9* 5.1*  ALBUMIN 2.6* 2.3*   No results for input(s): LIPASE, AMYLASE in the last 168 hours. No results for input(s): AMMONIA in the last 168 hours. Coagulation Profile: No results for input(s): INR, PROTIME in the last 168 hours. Cardiac Enzymes: No results for input(s): CKTOTAL, CKMB, CKMBINDEX, TROPONINI in the last 168 hours. BNP (last 3 results) Recent Labs    01/12/21  [place]*   HbA1C: No results for input(s): HGBA1C in the last 72 hours. CBG: Recent Labs  Lab 06/14/21 1113 06/14/21 1643 06/14/21 2144 06/15/21 0449 06/15/21 0805  GLUCAP 117* 151* 140* 100* 81   Lipid Profile: No results for input(s): CHOL, HDL, LDLCALC, TRIG, CHOLHDL, LDLDIRECT in the last 72 hours. Thyroid Function Tests: No results for input(s): TSH, T4TOTAL, FREET4, T3FREE, THYROIDAB in the last 72 hours. Anemia Panel: Recent Labs    06/13/21 0605  FERRITIN 350*   Sepsis Labs: Recent Labs  Lab 06/10/21 1829  PROCALCITON 0.41    Recent Results (from the past 240 hour(s))  Resp Panel by RT-PCR (Flu A&B, Covid) Nasopharyngeal Swab     Status: Abnormal   Collection Time: 06/10/21  6:29 PM   Specimen: Nasopharyngeal Swab; Nasopharyngeal(NP) swabs in vial transport medium  Result Value Ref Range Status   SARS Coronavirus 2 by RT PCR POSITIVE (A) NEGATIVE Final    Comment: CRITICAL RESULT CALLED TO, READ BACK BY AND VERIFIED WITH: [place] 06/10/2021 COLEMAN,R (NOTE) SARS-CoV-2 target nucleic acids are DETECTED.  The SARS-CoV-2 RNA is generally detectable in upper respiratory specimens during the acute phase of infection. Positive results are indicative of the presence of the identified virus, but do not rule out bacterial infection or co-infection with other pathogens not detected by the test. Clinical correlation with patient history and other  diagnostic information is necessary to determine patient infection status. The expected result is Negative.  Fact Sheet for Patients: EntrepreneurPulse.com.au  Fact Sheet for Healthcare Providers: IncredibleEmployment.be  This test is not yet approved or cleared by the [place] FDA and  has been authorized for detection and/or diagnosis of SARS-CoV-2 by FDA under an Emergency Use Authorization (EUA).  This EUA will remain in effect (meaning this t est can be used) for the duration of  the COVID-19 declaration under Section 564(b)(1) of the Act, 21 U.S.C. section 360bbb-3(b)(1), unless the authorization is terminated or revoked sooner.     Influenza A by PCR NEGATIVE NEGATIVE Final   Influenza B by PCR NEGATIVE NEGATIVE Final    Comment: (NOTE) The Xpert Xpress SARS-CoV-2/FLU/RSV plus assay is intended as an aid in the diagnosis of influenza from Nasopharyngeal swab specimens and should not be used as a sole basis for treatment. Nasal washings and aspirates are unacceptable for Xpert Xpress SARS-CoV-2/FLU/RSV testing.  Fact Sheet for Patients: EntrepreneurPulse.com.au  Fact Sheet for Healthcare Providers: IncredibleEmployment.be  This test is not yet approved or cleared by the [place] FDA and has been authorized for detection and/or diagnosis of SARS-CoV-2 by FDA under an Emergency Use Authorization (EUA). This EUA will remain in effect (meaning this test can be used) for the duration of the COVID-19 declaration under Section 564(b)(1) of the Act, 21 U.S.C. section 360bbb-3(b)(1), unless the authorization is terminated or revoked.  Performed at [facility], [address] [place], [place] [address]          Radiology Studies: No results found.      Scheduled Meds:  apixaban  2.5 mg Oral BID   vitamin C  500 mg Oral Daily   atorvastatin  40 mg Oral Daily   citalopram  20 mg Oral  Daily   ezetimibe  10 mg Oral Daily   feeding supplement  237 mL Oral TID BM   umeclidinium bromide  1 puff Inhalation Daily   And   fluticasone furoate-vilanterol  1 puff Inhalation Daily   metoprolol tartrate  25  mg Oral BID   mirtazapine  7.5 mg Oral QHS   mirtazapine  7.5 mg Oral QHS   multivitamin with minerals  1 tablet Oral Daily   nystatin   Topical TID   zinc sulfate  220 mg Oral Daily   Continuous Infusions:  dextrose 5 % and 0.9% NaCl 50 mL/hr at 06/15/21 0801     LOS: 4 days    Time spent: 35 minutes    Vyctoria Dickman Darleen Crocker, DO [facility]  If 7PM-7AM, please contact night-coverage [email or web address] 06/15/2021, 8:42 AM

## 2021-06-15 NOTE — TOC Progression Note (Signed)
Transition of Care [facility]) - Progression Note    Patient Details  Name: Laura Jacobs MRN: [id number] Date of Birth: [birth year]  Transition of Care [facility]) CM/SW Contact  Boneta Lucks, RN Phone Number: 06/15/2021, 4:01 PM  Clinical Narrative:   [facility] consulted by Palliative, family requesting home with hospice. Referral sent to [facility]. TOC to follow.   Expected Discharge Plan: Home w Hospice Care Barriers to Discharge: Continued Medical Work up  Expected Discharge Plan and Services Expected Discharge Plan: [place]

## 2021-06-15 NOTE — TOC Progression Note (Addendum)
Transition of Care [facility]) - Progression Note    Patient Details  Name: Laura Jacobs MRN: [id number] Date of Birth: 07-19-1934  Transition of Care [facility]) CM/SW Contact  Salome Arnt, [place] Phone Number: 06/15/2021, 9:39 AM  Clinical Narrative:  Discussed home health with pt's daughter, Pamala Hurry as unable to reach son who is at work. Barbara agreeable to HHPT. Referral to [facility] accepted. MD notified of need for home health orders. TOC will continue to follow.     Expected Discharge Plan: [place] Barriers to Discharge: Continued Medical Work up  Expected Discharge Plan and Services Expected Discharge Plan: [place] Choice: Home Health                                         Social Determinants of Health (SDOH) Interventions    Readmission Risk Interventions No flowsheet data found.

## 2021-06-15 NOTE — Progress Notes (Signed)
Calorie Count Follow up  INTERVENTION:  Calorie count day 1  through breakfast this morning. Data obtained -4 meals completed   Continue-Ensure Enlive po BID, each supplement provides 350 kcal and 20 grams of protein  Continue to provide assistance with feeding all meals    NUTRITION DIAGNOSIS:   Increased nutrient needs related to acute illness (COVID infection) as evidenced by energy intake < or equal to 50% for > or equal to 5 days.   GOAL:  Patient will meet greater than or equal to 90% of their needs   MONITOR:  PO intake, Supplement acceptance, Labs, Weight trends  REASON FOR ASSESSMENT:   Consult Calorie Count, Assessment of nutrition requirement/status  ASSESSMENT: Patient is a 87 yo female with hx of COPD, CHF, CKD-4, CAD, HLD, and HTN. Presents with AKI, COVID infection. Poor intake for a week prior to admission.  9/8 Calorie Count started this morning. Talked with nursing assistant who was feeding patient during my visit. Patient did not eat well yesterday. PO: 0-25% of 8 meals.Patient has an appetite medication on board. She likes Ensure Plus chocolate and the [place] Ice (mango).   Weight history reviewed. 05/12/20- 51.3 kg and current 54 kg.  In April and May of this year her weight peaked at 58-59 kg.  Medications reviewed and include: remeron, lipitor, zinc    9/9 Patient progress discussed with nursing. She continues to eat poorly. Noted below are intake results of 4 meals. She is consuming < 50% of est nutrition needs at this time. Palliative has been consulted to discuss [place]. Recommend discontinue calorie count. No BM since 9/6.   Calorie count: 9/8 Breakfast-15% 9/8 Lunch-0% 9/8 Dinner-0% 9/8 Ensure 100% x 1= 350 kcal, 20 gr protein  9/9 Breakfast-50% overall  [360 kcal, 14 gr protein,160 ml fluid]  Labs: BMP Latest Ref Rng & Units 06/15/2021 06/14/2021 06/13/2021  Glucose 70 - 99 mg/dL 89 89 84  BUN 8 - 23 mg/dL 23 23 30(H)  Creatinine 0.44 - 1.00 mg/dL  1.24(H) 1.41(H) 1.55(H)  BUN/Creat Ratio 12 - 28 - - -  Sodium 135 - 145 mmol/L 140 140 139  Potassium 3.5 - 5.1 mmol/L 3.6 3.8 3.8  Chloride 98 - 111 mmol/L 114(H) 110 107  CO2 22 - 32 mmol/L 22 23 23   Calcium 8.9 - 10.3 mg/dL 7.3(L) 7.6(L) 7.7(L)      NUTRITION - FOCUSED PHYSICAL EXAM: Unable to complete Nutrition-Focused physical exam at this time.  Patient is being fed. Likely malnutrition in the setting of COVID infection and poor intake. Will complete NFPE on return to clarify.  Diet Order:   Diet Order             Diet Heart Room service appropriate? Yes; Fluid consistency: Thin  Diet effective now                   EDUCATION NEEDS:  Not appropriate for education at this time  Skin:  Skin Assessment: Reviewed RN Assessment  Last BM:  9/6  Height:   Ht Readings from Last 1 Encounters:  06/10/21 5\' 3"  (1.6 m)    Weight:   Wt Readings from Last 1 Encounters:  06/10/21 54 kg    Ideal Body Weight:   52 kg  BMI:  Body mass index is 21.09 kg/m.  Estimated Nutritional Needs:   Kcal:  1620-1780  Protein:  70-75 gr  Fluid:  1300-1400 ml   Colman Cater MS,RD,CSG,LDN Contact: Shea Evans

## 2021-06-15 NOTE — Progress Notes (Signed)
Physical Therapy Treatment Patient Details Name: Laura Jacobs MRN: [id number] DOB: March 11, 1934 Today's Date: 06/15/2021    History of Present Illness Laura Jacobs is a 87 y.o. female with medical history significant of aortic stenosis, CAD, history of NSTEMI, chronic atrial fibrillation on apixaban, chronic diastolic heart failure, history of systolic CHF with recovered EF, stage III CKD, COPD, gout, hyperlipidemia, hypertension, history of unspecified pneumonia, renal cysts and diabetes syndrome who is coming to the emergency department via EMS due to generalized weakness and decreased oral intake for the past week.  She had a COVID exposure about a week ago.  No fever and only mild cough according to what the family told Dr. Karle Starch.  She denied headache, chest back or abdominal pain this time.  No nausea or vomiting.  Diarrhea or constipation.  Denied dysuria, frequency or hematuria.    PT Comments    Patient demonstrates slow labored movement for sitting up at bedside requiring frequent verbal/tactile cueing for proper hand placement with fair/poor carryover, very unsteady on feet with near loss of balance during transfer to chair secondary to weakness.  Patient tolerated sitting up in chair after therapy - nursing staff notified.  Patient will benefit from continued physical therapy in hospital and recommended venue below to increase strength, balance, endurance for safe ADLs and gait.      Follow Up Recommendations  SNF     Equipment Recommendations  None recommended by PT    Recommendations for Other Services       Precautions / Restrictions Precautions Precautions: Fall Restrictions Weight Bearing Restrictions: No    Mobility  Bed Mobility Overal bed mobility: Needs Assistance Bed Mobility: Sit to Supine     Supine to sit: Mod assist;Min assist     General bed mobility comments: slow labored movement, repeated verbal/tactile cueing for proper hand  placement    Transfers Overall transfer level: Needs assistance Equipment used: Rolling walker (2 wheeled) Transfers: Sit to/from [facility] Sit to Stand: Mod assist Stand pivot transfers: Mod assist       General transfer comment: had difficulty fo completing sit to stands due to BLE weakness  Ambulation/Gait Ambulation/Gait assistance: Mod assist;Max assist Gait Distance (Feet): 4 Feet Assistive device: Rolling walker (2 wheeled) Gait Pattern/deviations: Decreased step length - left;Decreased stance time - right;Decreased stride length;Trunk flexed Gait velocity: decreased   General Gait Details: limited to afew slow labored side steps with diffiiculty extending trunk due to weakness and near loss of balance   Stairs             Wheelchair Mobility    Modified Rankin (Stroke Patients Only)       Balance Overall balance assessment: Needs assistance Sitting-balance support: Feet supported;No upper extremity supported Sitting balance-Leahy Scale: Fair Sitting balance - Comments: fair/good seated at EOB   Standing balance support: During functional activity;Bilateral upper extremity supported Standing balance-Leahy Scale: Poor Standing balance comment: using RW                            Cognition Arousal/Alertness: Awake/alert Behavior During Therapy: WFL for tasks assessed/performed Overall Cognitive Status: No family/caregiver present to determine baseline cognitive functioning                                        Exercises General Exercises - Lower Extremity Long Arc  Quad: Seated;AROM;Strengthening;Both;10 reps Hip Flexion/Marching: Seated;AROM;Strengthening;Both;10 reps;AAROM Heel Raises: Seated;AROM;Strengthening;Both;10 reps    General Comments        Pertinent Vitals/Pain Pain Assessment: No/denies pain    Home Living                      Prior Function            PT Goals  (current goals can now be found in the care plan section) Acute Rehab PT Goals Patient Stated Goal: Return home PT Goal Formulation: With patient Time For Goal Achievement: 06/25/21 Potential to Achieve Goals: Good Progress towards PT goals: Progressing toward goals    Frequency    Min 3X/week      PT Plan Current plan remains appropriate    Co-evaluation              AM-PAC PT "6 Clicks" Mobility   Outcome Measure  Help needed turning from your back to your side while in a flat bed without using bedrails?: A Little Help needed moving from lying on your back to sitting on the side of a flat bed without using bedrails?: A Lot Help needed moving to and from a bed to a chair (including a wheelchair)?: A Lot Help needed standing up from a chair using your arms (e.g., wheelchair or bedside chair)?: A Lot Help needed to walk in hospital room?: A Lot Help needed climbing 3-5 steps with a railing? : Total 6 Click Score: 12    End of Session   Activity Tolerance: Patient tolerated treatment well;Patient limited by fatigue Patient left: in chair;with call bell/phone within reach Nurse Communication: Mobility status PT Visit Diagnosis: Unsteadiness on feet (R26.81);Other abnormalities of gait and mobility (R26.89);Muscle weakness (generalized) (M62.81)     Time: 0623-7628 PT Time Calculation (min) (ACUTE ONLY): 21 min  Charges:  $Therapeutic Exercise: 8-22 mins $Therapeutic Activity: 8-22 mins                     3:44 PM, 06/15/21 Lonell Grandchild, MPT Physical Therapist with [facility] 336 [phone] office [phone] mobile phone

## 2021-06-15 NOTE — Consult Note (Signed)
Consultation Note Date: 06/15/2021   Patient Name: Laura Jacobs  DOB: May 15, 1934  MRN: [id number]  Age / Sex: 87 y.o., female  PCP: Laura Pretty, FNP Referring Physician: Rodena Goldmann, DO  Reason for Consultation: Establishing goals of care  HPI/Patient Profile: 87 y.o. female  with past medical history of diastolic heart failure, atrial fibrillation, CAD, aortic stenosis, NSTEMI, HTN, HLD, CKD stage 3, COPD, gout, pneumonia, renal cysts, diabetes admitted on 06/10/2021 with weakness and decreased intake with COVID infection and ongoing failure to thrive and poor intake.   Clinical Assessment and Goals of Care: Laura Jacobs is sleeping and I did not awaken her. Discussed with RN who reports she ate a little more of her breakfast this morning and will drink some Ensure. Able to take her medication and swallow pills without difficulty. Otherwise she sleeps but without any distress, pain, or complaints.   I called and spoke with daughter-in-law, Laura Jacobs. Laura Jacobs shares that her husband Laura Jacobs and his brother Laura Jacobs are Laura Jacobs's decision makers. Laura Jacobs shares that they have decided that she would not want her to be resuscitated and have discussed possible interest in hospice. She notes that Laura Jacobs will be available after 1430 today for further conversation.   I was able to call and speak over conference call with both Laura Jacobs and Laura Jacobs. Ms. Laura Jacobs lives with Laura Jacobs and another son that are her main caregivers. They have good understanding of [name]'s poor prognosis and Laura Jacobs reports that he has been struggling to get her to eat well at home prior to this illness. They tell me that they have had discussions with their mother in the past and they know she would not want to be resuscitated and they value her comfort as priority. They both believe she will be happiest and most comfortable at home with [place].  They have hope that she will do a little better once she returns home but accepting if she does not. We discussed the benefits of hospice care at home given her poor prognosis and they agree this would be helpful to them. They will be ready to receive her back at home with hospice support when medical team feel she is medically optimized.   All questions/concerns addressed. Emotional support provided.   Primary Decision Maker HCPOA sons Laura Jacobs and Laura Jacobs   - DNR - Home with hospice once optimized  Code Status/Advance Care Planning: DNR   Symptom Management:  Per attending  Palliative Prophylaxis:  Aspiration, Bowel Regimen, Delirium Protocol, Frequent Pain Assessment, Oral Care, and Turn Reposition  Prognosis:  < 6 months and likely very less with poor intake.   Discharge Planning: Home with Hospice      Primary Diagnoses: Present on Admission:  AKI (acute kidney injury) ([place])  CAD (coronary artery disease)  Chronic atrial fibrillation (HCC)  Chronic kidney disease (CKD), stage IV (severe) (HCC)  COPD (chronic obstructive pulmonary disease) (HCC)  Hyperlipidemia  (Resolved) Chronic systolic CHF (congestive heart failure) ([place])   I  have reviewed the medical record, interviewed the patient and family, and examined the patient. The following aspects are pertinent.  Past Medical History:  Diagnosis Date   Aortic stenosis    Moderate by echo >>low flow low gradient preserved EF (SVI < 35 and DI 0.34) by echo 02/2021   CAD (coronary artery disease)    s/p inferior MI 1998 with subsequent cath showing 50% stenosis ostial Left main, 60% stenosis proximal LAD, 90% stenosis mid LAD, occluded mid CFX, 70% stenosis proximal RCA, 80% stenosis ostial PDA, occluded OM 2 and underwent CABG w LIMA to D1-D2-LAD, SVG to OM-circ, SVG to PD-PL 05/23/97.   Chronic atrial fibrillation (HCC)    Chronic diastolic CHF (congestive heart failure) (HCC)    Chronic  systolic CHF (congestive heart failure) ([place]) 02/09/2020   CKD (chronic kidney disease), stage III (HCC)    COPD (chronic obstructive pulmonary disease) (HCC)    Gout    Gout    Hyperlipidemia    Hypertension    Pneumonia    Renal cysts and diabetes syndrome (HCC)    Valvular heart disease    Social History   Socioeconomic History   Marital status: Widowed    Spouse name: Laura Jacobs   Number of children: 6   Years of education: 8   Highest education level: 8th grade  Occupational History   Occupation: retired  Tobacco Use   Smoking status: Former    Packs/day: 0.50    Years: 60.00    Pack years: 30.00    Types: Cigarettes    Quit date: 08/07/2014    Years since quitting: 6.8   Smokeless tobacco: Former    Quit date: 05/11/2014  Vaping Use   Vaping Use: Never used  Substance and Sexual Activity   Alcohol use: No   Drug use: No   Sexual activity: Yes    Birth control/protection: Post-menopausal  Other Topics Concern   Not on file  Social History Narrative   Retired, widowed, lives with two sons and a granddaughter. Having a harder time getting around, getting less active and memory is worsening - 05/07/21   Social Determinants of Health   Financial Resource Strain: Low Risk    Difficulty of Paying Living Expenses: Not hard at all  Food Insecurity: No Food Insecurity   Worried About Charity fundraiser in the Last Year: Never true   Ran Out of Food in the Last Year: Never true  Transportation Needs: No Transportation Needs   Lack of Transportation (Medical): No   Lack of Transportation (Non-Medical): No  Physical Activity: Insufficiently Active   Days of Exercise per Week: 7 days   Minutes of Exercise per Session: 10 min  Stress: No Stress Concern Present   Feeling of Stress : Not at all  Social Connections: Socially Isolated   Frequency of Communication with Friends and Family: Once a week   Frequency of Social Gatherings with Friends and Family: More than three times a  week   Attends Religious Services: Never   Marine scientist or Organizations: No   Attends Archivist Meetings: Never   Marital Status: Widowed   Family History  Problem Relation Age of Onset   Heart disease Mother    Alcohol abuse Father    Alzheimer's disease Sister    Cancer Sister    Scheduled Meds:  apixaban  2.5 mg Oral BID   vitamin C  500 mg Oral Daily   atorvastatin  40 mg Oral  Daily   citalopram  20 mg Oral Daily   ezetimibe  10 mg Oral Daily   feeding supplement  237 mL Oral TID BM   umeclidinium bromide  1 puff Inhalation Daily   And   fluticasone furoate-vilanterol  1 puff Inhalation Daily   metoprolol tartrate  25 mg Oral BID   mirtazapine  7.5 mg Oral QHS   mirtazapine  7.5 mg Oral QHS   multivitamin with minerals  1 tablet Oral Daily   nystatin   Topical TID   zinc sulfate  220 mg Oral Daily   Continuous Infusions:  dextrose 5 % and 0.9% NaCl 50 mL/hr at 06/15/21 0801   PRN Meds:.acetaminophen **OR** acetaminophen, albuterol, guaiFENesin-dextromethorphan, nitroGLYCERIN, ondansetron **OR** ondansetron (ZOFRAN) IV No Known Allergies Review of Systems  Unable to perform ROS: Other  Constitutional:        Confused    Physical Exam Vitals and nursing note reviewed.  Constitutional:      General: She is sleeping. She is not in acute distress.    Appearance: She is ill-appearing.  Cardiovascular:     Rate and Rhythm: Normal rate.  Neurological:     Mental Status: She is confused.    Vital Signs: BP 115/69 (BP Location: Left Arm)   Pulse 71   Temp 97.6 F (36.4 C) (Oral)   Resp 18   Ht 5\' 3"  (1.6 m)   Wt 54 kg   SpO2 99%   BMI 21.09 kg/m  Pain Scale: PAINAD   Pain Score: 0-No pain   SpO2: SpO2: 99 % O2 Device:SpO2: 99 % O2 Flow Rate: .O2 Flow Rate (L/min): 0 L/min  IO: Intake/output summary:  Intake/Output Summary (Last 24 hours) at 06/15/2021 1017 Last data filed at 06/15/2021 1950 Gross per 24 hour  Intake 1477.99 ml   Output --  Net 1477.99 ml    LBM: Last BM Date: 06/12/21 Baseline Weight: Weight: 54 kg Most recent weight: Weight: 54 kg     Palliative Assessment/Data:     Time Total: 50 min  Greater than 50%  of this time was spent counseling and coordinating care related to the above assessment and plan.  Signed by: Vinie Sill, NP Palliative Medicine Team Pager # [phone] (M-F 8a-5p) Team Phone # [phone] (Nights/Weekends)

## 2021-06-16 DIAGNOSIS — N179 Acute kidney failure, unspecified: Secondary | ICD-10-CM | POA: Insufficient documentation

## 2021-06-16 DIAGNOSIS — U071 COVID-19: Secondary | ICD-10-CM | POA: Insufficient documentation

## 2021-06-16 LAB — BASIC METABOLIC PANEL
Anion gap: <3 — ABNORMAL LOW (ref 5–15)
BUN: 25 mg/dL — ABNORMAL HIGH (ref 8–23)
CO2: 23 mmol/L (ref 22–32)
Calcium: 7 mg/dL — ABNORMAL LOW (ref 8.9–10.3)
Chloride: 114 mmol/L — ABNORMAL HIGH (ref 98–111)
Creatinine, Ser: 1.2 mg/dL — ABNORMAL HIGH (ref 0.44–1.00)
GFR, Estimated: 44 mL/min — ABNORMAL LOW (ref >60)
Glucose, Bld: 78 mg/dL (ref 70–99)
Potassium: 3.8 mmol/L (ref 3.5–5.1)
Sodium: 138 mmol/L (ref 135–145)

## 2021-06-16 LAB — GLUCOSE, CAPILLARY
Glucose-Capillary: 92 mg/dL (ref 70–99)
Glucose-Capillary: 71 mg/dL (ref 70–99)
Glucose-Capillary: 96 mg/dL (ref 70–99)
Glucose-Capillary: 78 mg/dL (ref 70–99)

## 2021-06-16 MED ORDER — MORPHINE SULFATE 20 MG/5ML PO SOLN: 2.5000 mg | ORAL | 0 refills | Status: AC | PRN

## 2021-06-16 MED ORDER — ENSURE ENLIVE PO LIQD: 237.0000 mL | Freq: Three times a day (TID) | ORAL | 12 refills | Status: AC

## 2021-06-16 MED ORDER — MIRTAZAPINE 7.5 MG PO TABS: 7.5000 mg | ORAL_TABLET | Freq: Every day | ORAL | 1 refills | Status: DC

## 2021-06-16 MED ORDER — GUAIFENESIN-DM 100-10 MG/5ML PO SYRP: 10.0000 mL | ORAL_SOLUTION | ORAL | 0 refills | Status: AC | PRN

## 2021-06-16 NOTE — Discharge Summary (Signed)
Physician Discharge Summary  Laura Jacobs TDD:[id number] DOB: 1934/05/18 DOA: 06/10/2021  PCP: [name], FNP  Admit date: 06/10/2021  Discharge date: 06/16/2021  Admitted From:Home  Disposition:  Home with hospice  Recommendations for Outpatient Follow-up:  Follow up with PCP in 1-2 weeks Remeron prescribed to assist with appetite  Follow hospice protocols per agency  Home Health: None  Equipment/Devices: None  Discharge Condition:Stable  CODE STATUS: Full  Diet recommendation: Heart Healthy  Brief/Interim Summary:  87 y.o. female with medical history significant of aortic stenosis, CAD, history of NSTEMI, chronic atrial fibrillation on apixaban, chronic diastolic heart failure, history of systolic CHF with recovered EF, stage III CKD, COPD, gout, hyperlipidemia, hypertension, history of unspecified pneumonia, renal cysts and diabetes syndrome who is coming to the emergency department via EMS due to generalized weakness and decreased oral intake for the past week.  Patient was admitted for poor p.o. intake in the setting of COVID infection as well as AKI that was associated.  Her lab markers are improving, but she continues to have poor oral intake that preceded this infection and appears to be related to failure to thrive.  Palliative consulted with discussion with family members who are in agreement that home hospice would be appropriate.  She will be set up for discharge today with home hospice agency as ordered.  Discharge Diagnoses:  Principal Problem:   AKI (acute kidney injury) ([place]) Active Problems:   Hyperlipidemia   Chronic kidney disease (CKD), stage IV (severe) (HCC)   CAD (coronary artery disease)   Chronic atrial fibrillation (HCC)   Aortic stenosis   COPD (chronic obstructive pulmonary disease) (HCC)   Chronic diastolic CHF (congestive heart failure) ([place])  Principal discharge diagnosis: Failure to thrive with noted COVID infection.  AKI on  CKD stage IV.  Discharge Instructions  Discharge Instructions     Diet - low sodium heart healthy   Complete by: As directed    Increase activity slowly   Complete by: As directed       Allergies as of 06/16/2021   No Known Allergies      Medication List     STOP taking these medications    apixaban 2.5 MG Tabs tablet Commonly known as: Eliquis   atorvastatin 40 MG tablet Commonly known as: LIPITOR   ezetimibe 10 MG tablet Commonly known as: ZETIA   furosemide 40 MG tablet Commonly known as: LASIX   ipratropium-albuterol 0.5-2.5 (3) MG/3ML Soln Commonly known as: DUONEB       TAKE these medications    albuterol (2.5 MG/3ML) 0.083% nebulizer solution Commonly known as: PROVENTIL Take 3 mLs (2.5 mg total) by nebulization every 2 (two) hours as needed for wheezing or shortness of breath. What changed: Another medication with the same name was removed. Continue taking this medication, and follow the directions you see here.   calcium-vitamin D 500-200 MG-UNIT tablet Commonly known as: Oscal 500/200 D-3 Take 1 tablet by mouth 2 (two) times daily.   citalopram 20 MG tablet Commonly known as: CeleXA Take 1 tablet (20 mg total) by mouth daily.   Compressor/Nebulizer Misc 1 Units by Does not apply route 3 times/day as needed-between meals & bedtime. -Nebulizer machine with tubing supplies to be used for administering nebulized medications as prescribed for COPD   feeding supplement Liqd Take 237 mLs by mouth 3 (three) times daily between meals.   guaiFENesin-dextromethorphan 100-10 MG/5ML syrup Commonly known as: ROBITUSSIN DM Take 10 mLs by mouth every 4 (four) hours  as needed for cough.   metoprolol tartrate 25 MG tablet Commonly known as: LOPRESSOR TAKE  (1)  TABLET TWICE A DAY. What changed:  how much to take how to take this when to take this   mirtazapine 7.5 MG tablet Commonly known as: REMERON Take 1 tablet (7.5 mg total) by mouth at  bedtime.   morphine 20 MG/5ML solution Take 0.6 mLs (2.4 mg total) by mouth every 2 (two) hours as needed for pain.   nitroGLYCERIN 0.4 MG SL tablet Commonly known as: Nitrostat PLACE 1 TABLET UNDER THE TONGUE EVERY 5 MINUTES AS NEEDED FOR CHEST PA IN What changed:  how much to take how to take this when to take this reasons to take this additional instructions   potassium chloride SA 20 MEQ tablet Commonly known as: KLOR-CON TAKE  (1)  TABLET TWICE A DAY. What changed:  how much to take how to take this when to take this additional instructions   Trelegy Ellipta 100-62.5-25 MCG/INH Aepb Generic drug: Fluticasone-Umeclidin-Vilant Inhale 1 puff into the lungs daily.        Follow-up Information     [facility] Follow up.   Specialty: Home Health Services Why: Will contact you to schedule home health visits. Contact information: [place] [address] [phone]                No Known Allergies  Consultations: Palliative care   Procedures/Studies: DG CHEST PORT 1 VIEW  Result Date: 06/13/2021 CLINICAL DATA:  COVID-19 positive EXAM: PORTABLE CHEST 1 VIEW COMPARISON:  06/10/2021 FINDINGS: Post CABG changes. Stable cardiomegaly. Atherosclerotic calcification of the aortic knob. Chronically coarsened interstitial markings most evident within the right lung base. No focal airspace consolidation, pleural effusion, or pneumothorax. IMPRESSION: Chronic lung changes without acute cardiopulmonary findings. Electronically Signed   By: Davina Poke D.O.   On: 06/13/2021 08:44   DG Chest Port 1 View  Result Date: 06/10/2021 CLINICAL DATA:  Cough, weakness, COPD, COVID positive. EXAM: PORTABLE CHEST 1 VIEW COMPARISON:  04/19/2021 FINDINGS: The lungs are symmetrically well expanded. Previously noted multifocal pulmonary infiltrate throughout the left lung has resolved. Chronic interstitial infiltrate noted at the right lung base  likely related to underlying interstitial lung disease. No superimposed focal pulmonary infiltrate identified. Mild biapical pleural calcification again noted. No pneumothorax or pleural effusion. Coronary artery bypass grafting has been performed. Cardiac size is mildly enlarged, unchanged. Pulmonary vascularity is normal. No acute bone abnormality. IMPRESSION: No active disease.  Stable cardiomegaly. Electronically Signed   By: Fidela Salisbury M.D.   On: 06/10/2021 20:45     Discharge Exam: Vitals:   06/16/21 0357 06/16/21 0837  BP: (!) 90/48 (!) 97/45  Pulse: 73 60  Resp: 18 18  Temp: (!) 97.5 F (36.4 C) 97.8 F (36.6 C)  SpO2: 99% 99%   Vitals:   06/15/21 1928 06/15/21 2122 06/16/21 0357 06/16/21 0837  BP: 117/65 102/65 (!) 90/48 (!) 97/45  Pulse: 80 72 73 60  Resp: 20 18 18 18   Temp: 98.6 F (37 C) (!) 97.4 F (36.3 C) (!) 97.5 F (36.4 C) 97.8 F (36.6 C)  TempSrc: Oral Oral Oral Oral  SpO2: 100% 100% 99% 99%  Weight:      Height:        General: Pt is alert, awake, not in acute distress Cardiovascular: RRR, S1/S2 +, no rubs, no gallops Respiratory: CTA bilaterally, no wheezing, no rhonchi Abdominal: Soft, NT, ND, bowel sounds +  Extremities: no edema, no cyanosis    The results of significant diagnostics from this hospitalization (including imaging, microbiology, ancillary and laboratory) are listed below for reference.     Microbiology: Recent Results (from the past 240 hour(s))  Resp Panel by RT-PCR (Flu A&B, Covid) Nasopharyngeal Swab     Status: Abnormal   Collection Time: 06/10/21  6:29 PM   Specimen: Nasopharyngeal Swab; Nasopharyngeal(NP) swabs in vial transport medium  Result Value Ref Range Status   SARS Coronavirus 2 by RT PCR POSITIVE (A) NEGATIVE Final    Comment: CRITICAL RESULT CALLED TO, READ BACK BY AND VERIFIED WITH: [place] 06/10/2021 COLEMAN,R (NOTE) SARS-CoV-2 target nucleic acids are DETECTED.  The SARS-CoV-2 RNA is generally  detectable in upper respiratory specimens during the acute phase of infection. Positive results are indicative of the presence of the identified virus, but do not rule out bacterial infection or co-infection with other pathogens not detected by the test. Clinical correlation with patient history and other diagnostic information is necessary to determine patient infection status. The expected result is Negative.  Fact Sheet for Patients: EntrepreneurPulse.com.au  Fact Sheet for Healthcare Providers: IncredibleEmployment.be  This test is not yet approved or cleared by the [place] FDA and  has been authorized for detection and/or diagnosis of SARS-CoV-2 by FDA under an Emergency Use Authorization (EUA).  This EUA will remain in effect (meaning this t est can be used) for the duration of  the COVID-19 declaration under Section 564(b)(1) of the Act, 21 U.S.C. section 360bbb-3(b)(1), unless the authorization is terminated or revoked sooner.     Influenza A by PCR NEGATIVE NEGATIVE Final   Influenza B by PCR NEGATIVE NEGATIVE Final    Comment: (NOTE) The Xpert Xpress SARS-CoV-2/FLU/RSV plus assay is intended as an aid in the diagnosis of influenza from Nasopharyngeal swab specimens and should not be used as a sole basis for treatment. Nasal washings and aspirates are unacceptable for Xpert Xpress SARS-CoV-2/FLU/RSV testing.  Fact Sheet for Patients: EntrepreneurPulse.com.au  Fact Sheet for Healthcare Providers: IncredibleEmployment.be  This test is not yet approved or cleared by the [place] FDA and has been authorized for detection and/or diagnosis of SARS-CoV-2 by FDA under an Emergency Use Authorization (EUA). This EUA will remain in effect (meaning this test can be used) for the duration of the COVID-19 declaration under Section 564(b)(1) of the Act, 21 U.S.C. section 360bbb-3(b)(1), unless the  authorization is terminated or revoked.  Performed at [facility], [address] [place], [place] [address]      Labs: BNP (last 3 results) Recent Labs    04/19/21 1150 06/10/21 1829  BNP 389.0* 774.1*   Basic Metabolic Panel: Recent Labs  Lab 06/12/21 0753 06/13/21 0605 06/14/21 0654 06/15/21 0601 06/16/21 0653  NA 137 139 140 140 138  K 3.3* 3.8 3.8 3.6 3.8  CL 105 107 110 114* 114*  CO2 24 23 23 22 23   GLUCOSE 51* 84 89 89 78  BUN 34* 30* 23 23 25*  CREATININE 2.05* 1.55* 1.41* 1.24* 1.20*  CALCIUM 7.5* 7.7* 7.6* 7.3* 7.0*  MG  --   --  1.5* 2.2  --    Liver Function Tests: Recent Labs  Lab 06/10/21 1829 06/11/21 0539  AST 48* 38  ALT 14 11  ALKPHOS 71 61  BILITOT 0.8 0.5  PROT 5.9* 5.1*  ALBUMIN 2.6* 2.3*   No results for input(s): LIPASE, AMYLASE in the last 168 hours. No results for input(s): AMMONIA in the last  168 hours. CBC: Recent Labs  Lab 06/10/21 1829 06/11/21 0539 06/12/21 0753 06/13/21 0605 06/14/21 0654  WBC 5.7 3.7* 3.5* 3.6* 4.1  NEUTROABS 4.1  --   --   --   --   HGB 10.7* 9.5* 10.1* 10.4* 10.0*  HCT 32.9* 30.0* 32.0* 33.0* 32.1*  MCV 94.3 94.0 95.2 94.8 95.3  PLT 132* 110* 110* 120* 122*   Cardiac Enzymes: No results for input(s): CKTOTAL, CKMB, CKMBINDEX, TROPONINI in the last 168 hours. BNP: Invalid input(s): POCBNP CBG: Recent Labs  Lab 06/15/21 1145 06/15/21 1619 06/15/21 2123 06/16/21 0348 06/16/21 0728  GLUCAP 82 90 108* 92 71   D-Dimer No results for input(s): DDIMER in the last 72 hours. Hgb A1c No results for input(s): HGBA1C in the last 72 hours. Lipid Profile No results for input(s): CHOL, HDL, LDLCALC, TRIG, CHOLHDL, LDLDIRECT in the last 72 hours. Thyroid function studies No results for input(s): TSH, T4TOTAL, T3FREE, THYROIDAB in the last 72 hours.  Invalid input(s): FREET3 Anemia work up No results for input(s): VITAMINB12, FOLATE, FERRITIN, TIBC, IRON, RETICCTPCT in the last 72  hours. Urinalysis    Component Value Date/Time   COLORURINE YELLOW 02/09/2020 1530   APPEARANCEUR HAZY (A) 02/09/2020 1530   LABSPEC 1.013 02/09/2020 1530   PHURINE 5.0 02/09/2020 1530   GLUCOSEU NEGATIVE 02/09/2020 1530   HGBUR MODERATE (A) 02/09/2020 1530   BILIRUBINUR NEGATIVE 02/09/2020 1530   BILIRUBINUR NEG 01/05/2013 1106   KETONESUR NEGATIVE 02/09/2020 1530   PROTEINUR NEGATIVE 02/09/2020 1530   UROBILINOGEN 1.0 08/23/2014 0001   NITRITE NEGATIVE 02/09/2020 1530   LEUKOCYTESUR LARGE (A) 02/09/2020 1530   Sepsis Labs Invalid input(s): PROCALCITONIN,  WBC,  LACTICIDVEN Microbiology Recent Results (from the past 240 hour(s))  Resp Panel by RT-PCR (Flu A&B, Covid) Nasopharyngeal Swab     Status: Abnormal   Collection Time: 06/10/21  6:29 PM   Specimen: Nasopharyngeal Swab; Nasopharyngeal(NP) swabs in vial transport medium  Result Value Ref Range Status   SARS Coronavirus 2 by RT PCR POSITIVE (A) NEGATIVE Final    Comment: CRITICAL RESULT CALLED TO, READ BACK BY AND VERIFIED WITH: [place] 06/10/2021 COLEMAN,R (NOTE) SARS-CoV-2 target nucleic acids are DETECTED.  The SARS-CoV-2 RNA is generally detectable in upper respiratory specimens during the acute phase of infection. Positive results are indicative of the presence of the identified virus, but do not rule out bacterial infection or co-infection with other pathogens not detected by the test. Clinical correlation with patient history and other diagnostic information is necessary to determine patient infection status. The expected result is Negative.  Fact Sheet for Patients: EntrepreneurPulse.com.au  Fact Sheet for Healthcare Providers: IncredibleEmployment.be  This test is not yet approved or cleared by the [place] FDA and  has been authorized for detection and/or diagnosis of SARS-CoV-2 by FDA under an Emergency Use Authorization (EUA).  This EUA will remain in  effect (meaning this t est can be used) for the duration of  the COVID-19 declaration under Section 564(b)(1) of the Act, 21 U.S.C. section 360bbb-3(b)(1), unless the authorization is terminated or revoked sooner.     Influenza A by PCR NEGATIVE NEGATIVE Final   Influenza B by PCR NEGATIVE NEGATIVE Final    Comment: (NOTE) The Xpert Xpress SARS-CoV-2/FLU/RSV plus assay is intended as an aid in the diagnosis of influenza from Nasopharyngeal swab specimens and should not be used as a sole basis for treatment. Nasal washings and aspirates are unacceptable for Xpert Xpress SARS-CoV-2/FLU/RSV testing.  Fact Sheet for  Patients: EntrepreneurPulse.com.au  Fact Sheet for Healthcare Providers: IncredibleEmployment.be  This test is not yet approved or cleared by the [place] FDA and has been authorized for detection and/or diagnosis of SARS-CoV-2 by FDA under an Emergency Use Authorization (EUA). This EUA will remain in effect (meaning this test can be used) for the duration of the COVID-19 declaration under Section 564(b)(1) of the Act, 21 U.S.C. section 360bbb-3(b)(1), unless the authorization is terminated or revoked.  Performed at [facility], [address], [place], [place] [address]      Time coordinating discharge: 35 minutes  SIGNED:   Rodena Goldmann, DO [facility] 06/16/2021, 10:09 AM  If 7PM-7AM, please contact night-coverage [email or web address]

## 2021-06-16 NOTE — Progress Notes (Signed)
Patient d/c to hom ewith hospice, sons at bedside were able to pick her up, iv removed and pressure dressing applied. D/C packet given to son, d/c instructions discussed with son.

## 2021-06-16 NOTE — TOC Transition Note (Addendum)
Transition of Care [facility]) - CM/SW Discharge Note   Patient Details  Name: Laura Jacobs MRN: [id number] Date of Birth: March 18, 1934  [facility] CM/SW Contact:  Natasha Bence, LCSW Phone Number: 06/16/2021, 4:34 PM   Clinical Narrative:    CSW notified of patient's readiness for discharge. CSW contacted [facility] to follow up on referral. Cathy with [facility] hospice reported that she is agreeable to take patient and requested H&P. CSW faxed H&P to after hours fax and facility fax. Tye [place] reported that patient is able to be discharged home and reported that patient will be scene by hospice nurse tomorrow. CSW confirmed with patient's son that they are agreeable. Patient's son agreeable. Tye [place] reported that she will follow up with patient's son this afternoon. Patient's son notified. RN reported that patient is able to be transported by wheelchair. Patient's son agreeable to transport patient by wheelchair. TOC signing off.    Final next level of care: Home w Hospice Care Barriers to Discharge: Barriers Resolved   Patient Goals and CMS Choice Patient states their goals for this hospitalization and ongoing recovery are:: home with hospice CMS Medicare.gov Compare Post Acute Care list provided to:: Patient Choice offered to / list presented to : Patient  Discharge Placement                Patient to be transferred to facility by: Family   Patient and family notified of of transfer: 06/16/21  Discharge Plan and Services     Post Acute Care Choice: Home Health                               Social Determinants of Health (SDOH) Interventions     Readmission Risk Interventions No flowsheet data found.

## 2021-06-17 DIAGNOSIS — I10 Essential (primary) hypertension: Secondary | ICD-10-CM | POA: Insufficient documentation

## 2021-06-17 DIAGNOSIS — I509 Heart failure, unspecified: Secondary | ICD-10-CM | POA: Insufficient documentation

## 2021-06-18 ENCOUNTER — Telehealth: Payer: Self-pay

## 2021-06-18 NOTE — Telephone Encounter (Signed)
Transition Care Management Follow-up Telephone Call Date of discharge and from where: [facility] 06/16/21 Diagnosis:  AKI How have you been since you were released from the hospital? Spoke with her son, Evelena Peat who says she isn't doing well - He feels like she is steadily declining Any questions or concerns? No  Items Reviewed: Did the pt receive and understand the discharge instructions provided?  Her son did Medications obtained and verified? Yes  Other? No  Any new allergies since your discharge? No  Dietary orders reviewed? Yes Do you have support at home? Yes   Home Care and Equipment/Supplies: Were home health services ordered? yes If so, what is the name of the agency? Hospice  Has the agency set up a time to come to the patient's home? yes Were any new equipment or medical supplies ordered?  No What is the name of the medical supply agency? N/a Were you able to get the supplies/equipment? not applicable Do you have any questions related to the use of the equipment or supplies? No  Functional Questionnaire: (I = Independent and D = Dependent) ADLs: D  Bathing/Dressing- D  Meal Prep- D  Eating- D  Maintaining continence- D  Transferring/Ambulation- D  Managing Meds- D  Follow up appointments reviewed:  PCP Hospital f/u appt confirmed?  Yes  Scheduled to see Shelah Lewandowsky on 06/25/21 @ 2:30. - virtual visit - patient is going to call back to confirm [facility] f/u appt confirmed? No   Are transportation arrangements needed? No  If their condition worsens, is the pt aware to call PCP or go to the Emergency Dept.? Yes Was the patient provided with contact information for the PCP's office or ED? Yes Was to pt encouraged to call back with questions or concerns? Yes

## 2021-06-21 LAB — GLUCOSE, CAPILLARY: Glucose-Capillary: 39 mg/dL — CL (ref 70–99)

## 2021-06-25 ENCOUNTER — Encounter: Payer: Self-pay | Admitting: Nurse Practitioner

## 2021-06-25 ENCOUNTER — Ambulatory Visit (INDEPENDENT_AMBULATORY_CARE_PROVIDER_SITE_OTHER): Payer: Medicare Other | Admitting: Nurse Practitioner

## 2021-06-25 DIAGNOSIS — N189 Chronic kidney disease, unspecified: Secondary | ICD-10-CM | POA: Diagnosis not present

## 2021-06-25 DIAGNOSIS — Z09 Encounter for follow-up examination after completed treatment for conditions other than malignant neoplasm: Secondary | ICD-10-CM

## 2021-06-25 DIAGNOSIS — N179 Acute kidney failure, unspecified: Secondary | ICD-10-CM | POA: Diagnosis not present

## 2021-06-25 NOTE — Progress Notes (Signed)
Virtual Visit  Note Due to COVID-19 pandemic this visit was conducted virtually. This visit type was conducted due to national recommendations for restrictions regarding the COVID-19 Pandemic (e.g. social distancing, sheltering in place) in an effort to limit this patient's exposure and mitigate transmission in our community. All issues noted in this document were discussed and addressed.  A physical exam was not performed with this format.  I connected with Laura Jacobs on 06/25/21 at 2:05 by telephone and verified that I am speaking with the correct person using two identifiers. Laura Jacobs is currently located at home and her husband is currently with her during visit. The provider, [name], FNP is located in their office at time of visit.  I discussed the limitations, risks, security and privacy concerns of performing an evaluation and management service by telephone and the availability of in person appointments. I also discussed with the patient that there may be a patient responsible charge related to this service. The patient expressed understanding and agreed to proceed.   History and Present Illness:  Chief Complaint: hospital follow up  HPI Patient was taken to the hospital on  with confusion and weakness. She was dx wit kidney failure. She has been home since 06/16/21. She was discharge home with hospice. She now has a hospital bed and is not able to get up and get around. Hospice nurse came today for first time. They are going to send someone to bath her tomorrow. She has some confusion. She is able to talk and have conversation. Appetite is good and she is drinking fluids. Urine output is normal. They did not make any medication changes.    Review of Systems  Constitutional:  Negative for diaphoresis and weight loss.  Eyes:  Negative for blurred vision, double vision and pain.  Respiratory:  Negative for shortness of breath.   Cardiovascular:   Negative for chest pain, palpitations, orthopnea and leg swelling.  Gastrointestinal:  Negative for abdominal pain.  Musculoskeletal:  Positive for back pain.  Skin:  Negative for rash.  Neurological:  Negative for dizziness, sensory change, loss of consciousness, weakness and headaches.  Endo/Heme/Allergies:  Negative for polydipsia. Does not bruise/bleed easily.  Psychiatric/Behavioral:  Negative for memory loss. The patient does not have insomnia.   All other systems reviewed and are negative.   Observations/Objective: Alert and oriented- answers all questions appropriately No distress   Assessment and Plan: Laura Jacobs in today with chief complaint of No chief complaint on file.   1. Acute kidney injury superimposed on chronic kidney disease ([place]) Continue to watch urine output Have hospice nurse contact me if need anything  2. Hospital discharge follow-up Hospital records reviewed    Follow Up Instructions: prn    I discussed the assessment and treatment plan with the patient. The patient was provided an opportunity to ask questions and all were answered. The patient agreed with the plan and demonstrated an understanding of the instructions.   The patient was advised to call back or seek an in-person evaluation if the symptoms worsen or if the condition fails to improve as anticipated.  The above assessment and management plan was discussed with the patient. The patient verbalized understanding of and has agreed to the management plan. Patient is aware to call the clinic if symptoms persist or worsen. Patient is aware when to return to the clinic for a follow-up visit. Patient educated on when it is appropriate to go to the emergency department.  Time call ended:  2:17  I provided 12 minutes of  non face-to-face time during this encounter.    [name], FNP

## 2021-10-12 ENCOUNTER — Other Ambulatory Visit: Payer: Self-pay | Admitting: Nurse Practitioner

## 2021-10-12 DIAGNOSIS — I2510 Atherosclerotic heart disease of native coronary artery without angina pectoris: Secondary | ICD-10-CM

## 2021-11-05 ENCOUNTER — Other Ambulatory Visit: Payer: Self-pay | Admitting: Nurse Practitioner

## 2021-11-05 DIAGNOSIS — I2510 Atherosclerotic heart disease of native coronary artery without angina pectoris: Secondary | ICD-10-CM

## 2021-11-05 NOTE — Telephone Encounter (Signed)
Spoke with pt's son, he will call back to set up appt.

## 2021-11-05 NOTE — Telephone Encounter (Signed)
MMM NTBS 30 days given 10/12/21

## 2021-11-08 ENCOUNTER — Telehealth (INDEPENDENT_AMBULATORY_CARE_PROVIDER_SITE_OTHER): Payer: Medicare Other | Admitting: Nurse Practitioner

## 2021-11-08 ENCOUNTER — Encounter: Payer: Self-pay | Admitting: Nurse Practitioner

## 2021-11-08 DIAGNOSIS — N184 Chronic kidney disease, stage 4 (severe): Secondary | ICD-10-CM | POA: Diagnosis not present

## 2021-11-08 DIAGNOSIS — F411 Generalized anxiety disorder: Secondary | ICD-10-CM

## 2021-11-08 DIAGNOSIS — I480 Paroxysmal atrial fibrillation: Secondary | ICD-10-CM | POA: Diagnosis not present

## 2021-11-08 DIAGNOSIS — R630 Anorexia: Secondary | ICD-10-CM

## 2021-11-08 DIAGNOSIS — I2510 Atherosclerotic heart disease of native coronary artery without angina pectoris: Secondary | ICD-10-CM | POA: Diagnosis not present

## 2021-11-08 DIAGNOSIS — I119 Hypertensive heart disease without heart failure: Secondary | ICD-10-CM

## 2021-11-08 DIAGNOSIS — E7800 Pure hypercholesterolemia, unspecified: Secondary | ICD-10-CM | POA: Diagnosis not present

## 2021-11-08 DIAGNOSIS — J418 Mixed simple and mucopurulent chronic bronchitis: Secondary | ICD-10-CM

## 2021-11-08 MED ORDER — CITALOPRAM HYDROBROMIDE 20 MG PO TABS: 20.0000 mg | ORAL_TABLET | Freq: Every day | ORAL | 1 refills | Status: AC

## 2021-11-08 MED ORDER — MIRTAZAPINE 7.5 MG PO TABS: 7.5000 mg | ORAL_TABLET | Freq: Every day | ORAL | 1 refills | Status: AC

## 2021-11-08 MED ORDER — POTASSIUM CHLORIDE CRYS ER 20 MEQ PO TBCR: EXTENDED_RELEASE_TABLET | ORAL | 0 refills | Status: AC

## 2021-11-08 MED ORDER — TRELEGY ELLIPTA 100-62.5-25 MCG/ACT IN AEPB: 1.0000 | INHALATION_SPRAY | Freq: Every day | RESPIRATORY_TRACT | 11 refills | Status: AC

## 2021-11-08 MED ORDER — METOPROLOL TARTRATE 25 MG PO TABS: ORAL_TABLET | ORAL | 1 refills | Status: AC

## 2021-11-08 MED ORDER — APIXABAN 2.5 MG PO TABS: 2.5000 mg | ORAL_TABLET | Freq: Two times a day (BID) | ORAL | 1 refills | Status: AC

## 2021-11-08 MED ORDER — ATORVASTATIN CALCIUM 40 MG PO TABS: 40.0000 mg | ORAL_TABLET | Freq: Every day | ORAL | 1 refills | Status: AC

## 2021-11-08 MED ORDER — FUROSEMIDE 40 MG PO TABS: 40.0000 mg | ORAL_TABLET | Freq: Every day | ORAL | 1 refills | Status: AC

## 2021-11-08 NOTE — Progress Notes (Signed)
Virtual Visit Consent   [name], you are scheduled for a virtual visit with [name], [place], a [facility] provider, today.     Just as with appointments in the office, your consent must be obtained to participate.  Your consent will be active for this visit and any virtual visit you may have with one of our providers in the next 365 days.     If you have a MyChart account, a copy of this consent can be sent to you electronically.  All virtual visits are billed to your insurance company just like a traditional visit in the office.    As this is a virtual visit, video technology does not allow for your provider to perform a traditional examination.  This may limit your provider's ability to fully assess your condition.  If your provider identifies any concerns that need to be evaluated in person or the need to arrange testing (such as labs, EKG, etc.), we will make arrangements to do so.     Although advances in technology are sophisticated, we cannot ensure that it will always work on either your end or our end.  If the connection with a video visit is poor, the visit may have to be switched to a telephone visit.  With either a video or telephone visit, we are not always able to ensure that we have a secure connection.     I need to obtain your verbal consent now.   Are you willing to proceed with your visit today? YES   ARIANIE COUSE has provided verbal consent on 11/08/2021 for a virtual visit (video or telephone).   [name], FNP   Date: 11/08/2021 10:49 AM   Virtual Visit via Video Note   I, [name], connected with Laura Jacobs ([id number], 01-10-1934) on 11/08/21 at 10:30 AM EST by a video-enabled telemedicine application and verified that I am speaking with the correct person using two identifiers.  Location: Patient: Virtual Visit Location Patient: Home Provider: Virtual Visit Location Provider: Mobile   I discussed  the limitations of evaluation and management by telemedicine and the availability of in person appointments. The patient expressed understanding and agreed to proceed.    History of Present Illness: Laura Jacobs is a 88 y.o. who identifies as a female who was assigned female at birth, and is being seen today for chronic follow up.  HPI:  Chief Complaint: No chief complaint on file.    HPI:  Laura Jacobs is a 88 y.o. who identifies as a female who was assigned female at birth.   Social history: Lives with: son and daughter in law Work history: bedridden- is now on hospice. Not able to speak due to her dementia   Comes in today for follow up of the following chronic medical issues:  1. Hypertensive heart disease  2. Coronary artery disease involving native coronary artery of native heart without angina pectoris 3. Mixed simple and mucopurulent chronic bronchitis ([place]) 4. Chronic kidney disease (CKD), stage IV (severe) ([place]) 5. Pure hypercholesterolemia  6. Decreased appetite 7. GAD (generalized anxiety disorder)  8. Paroxysmal atrial fibrillation [facility])   Patient is doing well. No c/o chest pain, or headaches.wears her oxygen mainly early in the mornings for a couple of hours. She mainly stays in her hospital bed now. Her appetite is still good. Denies any swelling or skin breakdown   New complaints: None today  No Known Allergies Outpatient Encounter Medications as of 11/08/2021  Medication Sig   Fluticasone-Umeclidin-Vilant (TRELEGY ELLIPTA) 100-62.5-25 MCG/ACT AEPB Inhale 1 puff into the lungs daily.   albuterol (PROVENTIL) (2.5 MG/3ML) 0.083% nebulizer solution Take 3 mLs (2.5 mg total) by nebulization every 2 (two) hours as needed for wheezing or shortness of breath. (Patient not taking: No sig reported)   apixaban (ELIQUIS) 2.5 MG TABS tablet Take 1 tablet (2.5 mg total) by mouth 2 (two) times daily.   atorvastatin (LIPITOR) 40 MG tablet Take 1 tablet  (40 mg total) by mouth daily.   barrier cream (NON-SPECIFIED) CREA [id number]  barrier cream one; daily or when changing diaper; PRN;   calcium-vitamin D (OSCAL 500/200 D-3) 500-200 MG-UNIT per tablet Take 1 tablet by mouth 2 (two) times daily.   citalopram (CELEXA) 20 MG tablet Take 1 tablet (20 mg total) by mouth daily.   ezetimibe (ZETIA) 10 MG tablet Take by mouth.   feeding supplement (ENSURE ENLIVE / ENSURE PLUS) LIQD Take 237 mLs by mouth 3 (three) times daily between meals.   furosemide (LASIX) 40 MG tablet Take 1 tablet (40 mg total) by mouth daily.   guaiFENesin-dextromethorphan (ROBITUSSIN DM) 100-10 MG/5ML syrup Take 10 mLs by mouth every 4 (four) hours as needed for cough.   metoprolol tartrate (LOPRESSOR) 25 MG tablet TAKE  (1)  TABLET TWICE A DAY.   mirtazapine (REMERON) 7.5 MG tablet Take 1 tablet (7.5 mg total) by mouth at bedtime.   morphine 20 MG/5ML solution Take 0.6 mLs (2.4 mg total) by mouth every 2 (two) hours as needed for pain.   Nebulizers (COMPRESSOR/NEBULIZER) MISC 1 Units by Does not apply route 3 times/day as needed-between meals & bedtime. -Nebulizer machine with tubing supplies to be used for administering nebulized medications as prescribed for COPD   nitroGLYCERIN (NITROSTAT) 0.4 MG SL tablet PLACE 1 TABLET UNDER THE TONGUE EVERY 5 MINUTES AS NEEDED FOR CHEST PA IN (Patient taking differently: Place 0.4 mg under the tongue every 5 (five) minutes as needed for chest pain.)   OXYGEN [id number]  Oxygen 2-4 L/min via nasal cannula; prn patient comfort;   potassium chloride SA (KLOR-CON M) 20 MEQ tablet TAKE ONE TABLET TWICE DAILY (NEEDS TO BE SEEN BEFORE NEXT REFILL)   Sennosides-Docusate Sodium (SENNA-TIME S PO) Take by mouth.   [DISCONTINUED] apixaban (ELIQUIS) 2.5 MG TABS tablet Take by mouth.   [DISCONTINUED] atorvastatin (LIPITOR) 40 MG tablet Take by mouth.   [DISCONTINUED] citalopram (CELEXA) 20 MG tablet Take 1 tablet (20 mg total) by mouth daily.    [DISCONTINUED] Fluticasone-Umeclidin-Vilant (TRELEGY ELLIPTA) 100-62.5-25 MCG/INH AEPB Inhale 1 puff into the lungs daily. (Patient not taking: No sig reported)   [DISCONTINUED] furosemide (LASIX) 40 MG tablet Take by mouth.   [DISCONTINUED] metoprolol tartrate (LOPRESSOR) 25 MG tablet TAKE  (1)  TABLET TWICE A DAY. (Patient taking differently: Take 25 mg by mouth 2 (two) times daily. TAKE  (1)  TABLET TWICE A DAY.)   [DISCONTINUED] mirtazapine (REMERON) 7.5 MG tablet Take 1 tablet (7.5 mg total) by mouth at bedtime.   [DISCONTINUED] potassium chloride SA (KLOR-CON M) 20 MEQ tablet TAKE ONE TABLET TWICE DAILY (NEEDS TO BE SEEN BEFORE NEXT REFILL)   No facility-administered encounter medications on file as of 11/08/2021.    Past Surgical History:  Procedure Laterality Date   heart by pass      Family History  Problem Relation Age of Onset   Heart disease Mother    Alcohol abuse Father    Alzheimer's disease Sister    Cancer Sister  Controlled substance contract: n/a    Review of Systems  Constitutional:  Negative for chills and fever.  Respiratory:  Positive for shortness of breath (chronic).   Cardiovascular:  Positive for leg swelling. Negative for chest pain and palpitations.  Musculoskeletal:  Negative for myalgias.  Neurological:  Positive for weakness. Negative for dizziness and headaches.   Problems:  Patient Active Problem List   Diagnosis Date Noted   Essential (primary) hypertension 06/17/2021   Heart failure, unspecified ([place]) 06/17/2021   COVID-19 06/16/2021   Chronic diastolic CHF (congestive heart failure) ([place])    Acute hypoxemic respiratory failure ([place]) 04/19/2021   COPD (chronic obstructive pulmonary disease) ([place]) 03/08/2021   Aortic stenosis    Chronic atrial fibrillation ([place])    Osteoporosis 02/27/2015   Low serum vitamin D 02/27/2015   Paroxysmal atrial fibrillation ([place])    Long term current use of anticoagulant therapy    Lung nodule  08/23/2014   CAD (coronary artery disease) 08/11/2013   Gout 05/10/2013   Hyperlipidemia 02/04/2013   Chronic kidney disease (CKD), stage IV (severe) ([place]) 02/04/2013   Hypertensive heart disease      Allergies: No Known Allergies Medications:  Current Outpatient Medications:    Fluticasone-Umeclidin-Vilant (TRELEGY ELLIPTA) 100-62.5-25 MCG/ACT AEPB, Inhale 1 puff into the lungs daily., Disp: 1 each, Rfl: 11   albuterol (PROVENTIL) (2.5 MG/3ML) 0.083% nebulizer solution, Take 3 mLs (2.5 mg total) by nebulization every 2 (two) hours as needed for wheezing or shortness of breath. (Patient not taking: No sig reported), Disp: 75 mL, Rfl: 12   apixaban (ELIQUIS) 2.5 MG TABS tablet, Take 1 tablet (2.5 mg total) by mouth 2 (two) times daily., Disp: 180 tablet, Rfl: 1   atorvastatin (LIPITOR) 40 MG tablet, Take 1 tablet (40 mg total) by mouth daily., Disp: 90 tablet, Rfl: 1   barrier cream (NON-SPECIFIED) CREA, [id number]  barrier cream  one; daily or when changing diaper; PRN;, Disp: , Rfl:    calcium-vitamin D (OSCAL 500/200 D-3) 500-200 MG-UNIT per tablet, Take 1 tablet by mouth 2 (two) times daily., Disp: , Rfl:    citalopram (CELEXA) 20 MG tablet, Take 1 tablet (20 mg total) by mouth daily., Disp: 90 tablet, Rfl: 1   ezetimibe (ZETIA) 10 MG tablet, Take by mouth., Disp: , Rfl:    feeding supplement (ENSURE ENLIVE / ENSURE PLUS) LIQD, Take 237 mLs by mouth 3 (three) times daily between meals., Disp: 237 mL, Rfl: 12   furosemide (LASIX) 40 MG tablet, Take 1 tablet (40 mg total) by mouth daily., Disp: 90 tablet, Rfl: 1   guaiFENesin-dextromethorphan (ROBITUSSIN DM) 100-10 MG/5ML syrup, Take 10 mLs by mouth every 4 (four) hours as needed for cough., Disp: 118 mL, Rfl: 0   metoprolol tartrate (LOPRESSOR) 25 MG tablet, TAKE  (1)  TABLET TWICE A DAY., Disp: 180 tablet, Rfl: 1   mirtazapine (REMERON) 7.5 MG tablet, Take 1 tablet (7.5 mg total) by mouth at bedtime., Disp: 90 tablet, Rfl: 1   morphine 20  MG/5ML solution, Take 0.6 mLs (2.4 mg total) by mouth every 2 (two) hours as needed for pain., Disp: 100 mL, Rfl: 0   Nebulizers (COMPRESSOR/NEBULIZER) MISC, 1 Units by Does not apply route 3 times/day as needed-between meals & bedtime. -Nebulizer machine with tubing supplies to be used for administering nebulized medications as prescribed for COPD, Disp: 1 each, Rfl: 0   nitroGLYCERIN (NITROSTAT) 0.4 MG SL tablet, PLACE 1 TABLET UNDER THE TONGUE EVERY 5 MINUTES AS NEEDED FOR CHEST PA  IN (Patient taking differently: Place 0.4 mg under the tongue every 5 (five) minutes as needed for chest pain.), Disp: 25 tablet, Rfl: 0   OXYGEN, [id number]  Oxygen 2-4 L/min via nasal cannula; prn patient comfort;, Disp: , Rfl:    potassium chloride SA (KLOR-CON M) 20 MEQ tablet, TAKE ONE TABLET TWICE DAILY (NEEDS TO BE SEEN BEFORE NEXT REFILL), Disp: 60 tablet, Rfl: 0   Sennosides-Docusate Sodium (SENNA-TIME S PO), Take by mouth., Disp: , Rfl:   Observations/Objective: Patient is well-developed, well-nourished in no acute distress.  Resting comfortably  at home.  Head is normocephalic,atraumatic.  No labored breathing.  Speech is clear and coherent with logical content.  Patient is alert and oriented at baseline.  Patient has dementia and is not able  to answer any questions  Assessment and Plan:  Laura Jacobs comes in today with chief complaint of No chief complaint on file.   Diagnosis and orders addressed:  1. Hypertensive heart disease  Low sodium diet - furosemide (LASIX) 40 MG tablet; Take 1 tablet (40 mg total) by mouth daily.  Dispense: 90 tablet; Refill: 1  2. Coronary artery disease involving native coronary artery of native heart without angina pectoris Not able to see cardiology - potassium chloride SA (KLOR-CON M) 20 MEQ tablet; TAKE ONE TABLET TWICE DAILY (NEEDS TO BE SEEN BEFORE NEXT REFILL)  Dispense: 60 tablet; Refill: 0  3. Mixed simple and mucopurulent chronic bronchitis  (HCC) Continue inhaler - Fluticasone-Umeclidin-Vilant (TRELEGY ELLIPTA) 100-62.5-25 MCG/ACT AEPB; Inhale 1 puff into the lungs daily.  Dispense: 1 each; Refill: 11  4. Chronic kidney disease (CKD), stage IV (severe) (HCC) Labs not bale to be done  5. Pure hypercholesterolemia Low fat diet - atorvastatin (LIPITOR) 40 MG tablet; Take 1 tablet (40 mg total) by mouth daily.  Dispense: 90 tablet; Refill: 1  6. Decreased appetite - mirtazapine (REMERON) 7.5 MG tablet; Take 1 tablet (7.5 mg total) by mouth at bedtime.  Dispense: 90 tablet; Refill: 1  7. GAD (generalized anxiety disorder) Stress management - citalopram (CELEXA) 20 MG tablet; Take 1 tablet (20 mg total) by mouth daily.  Dispense: 90 tablet; Refill: 1  8. Paroxysmal atrial fibrillation (HCC) Avoid caffeine - metoprolol tartrate (LOPRESSOR) 25 MG tablet; TAKE  (1)  TABLET TWICE A DAY.  Dispense: 180 tablet; Refill: 1 - apixaban (ELIQUIS) 2.5 MG TABS tablet; Take 1 tablet (2.5 mg total) by mouth 2 (two) times daily.  Dispense: 180 tablet; Refill: 1   Labs pending Health Maintenance reviewed Diet and exercise encouraged  Follow up plan: 6 months   Follow Up Instructions: I discussed the assessment and treatment plan with the patient. The patient was provided an opportunity to ask questions and all were answered. The patient agreed with the plan and demonstrated an understanding of the instructions.  A copy of instructions were sent to the patient via MyChart.  The patient was advised to call back or seek an in-person evaluation if the symptoms worsen or if the condition fails to improve as anticipated.  Time:  I spent 16 minutes with the patient via telehealth technology discussing the above problems/concerns.    [name], FNP

## 2021-12-05 ENCOUNTER — Telehealth (HOSPITAL_COMMUNITY): Payer: Self-pay | Admitting: Cardiology

## 2021-12-05 NOTE — Telephone Encounter (Signed)
I called patient to schedule echocardiogram and spouse states that patient is bedridden and under [facility] care. Order will be removed from the echo wq until directed from the MD. ?

## 2022-01-05 DEATH — deceased

## 2022-05-08 ENCOUNTER — Ambulatory Visit: Payer: Medicare Other

## 2023-02-24 NOTE — Telephone Encounter (Signed)
Erroneous encounter will close.
